# Patient Record
Sex: Female | Born: 1985 | Race: White | Hispanic: No | Marital: Married | Smoking: Former smoker
Health system: Southern US, Community
[De-identification: ages and names within clinical notes are randomized; demographics above are authoritative.]

## PROBLEM LIST (undated history)

## (undated) ENCOUNTER — Inpatient Hospital Stay: Payer: Self-pay

## (undated) DIAGNOSIS — Z6837 Body mass index (BMI) 37.0-37.9, adult: Secondary | ICD-10-CM

## (undated) DIAGNOSIS — F419 Anxiety disorder, unspecified: Secondary | ICD-10-CM

## (undated) DIAGNOSIS — F909 Attention-deficit hyperactivity disorder, unspecified type: Secondary | ICD-10-CM

## (undated) DIAGNOSIS — F32A Depression, unspecified: Secondary | ICD-10-CM

## (undated) DIAGNOSIS — R519 Headache, unspecified: Secondary | ICD-10-CM

## (undated) DIAGNOSIS — I10 Essential (primary) hypertension: Secondary | ICD-10-CM

## (undated) DIAGNOSIS — E669 Obesity, unspecified: Secondary | ICD-10-CM

## (undated) HISTORY — DX: Anxiety disorder, unspecified: F41.9

## (undated) HISTORY — PX: ABDOMINAL HYSTERECTOMY: SHX81

## (undated) HISTORY — DX: Depression, unspecified: F32.A

## (undated) HISTORY — PX: NO PAST SURGERIES: SHX2092

---
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
## 2008-07-24 ENCOUNTER — Emergency Department: Payer: Self-pay | Admitting: Emergency Medicine

## 2008-10-28 ENCOUNTER — Encounter: Payer: Self-pay | Admitting: Obstetrics and Gynecology

## 2008-11-01 ENCOUNTER — Encounter: Payer: Self-pay | Admitting: Obstetrics and Gynecology

## 2008-11-11 ENCOUNTER — Encounter: Payer: Self-pay | Admitting: Maternal & Fetal Medicine

## 2008-11-21 ENCOUNTER — Observation Stay: Payer: Self-pay

## 2008-11-30 ENCOUNTER — Encounter: Payer: Self-pay | Admitting: Pediatric Cardiology

## 2009-01-06 ENCOUNTER — Encounter: Payer: Self-pay | Admitting: Obstetrics and Gynecology

## 2009-03-11 ENCOUNTER — Observation Stay: Payer: Self-pay

## 2009-03-17 ENCOUNTER — Observation Stay: Payer: Self-pay

## 2009-04-03 ENCOUNTER — Inpatient Hospital Stay: Payer: Self-pay | Admitting: Unknown Physician Specialty

## 2009-10-09 IMAGING — US ULTRAOUND OB LIMITED - NRPT [facility]
1 series · 14 of 28 positions shown · non-contrast
Comparison: none

[Series 1: ultraound ob limited - nrpt [facility] · 14 of 41 slices shown]
[im 2/41]
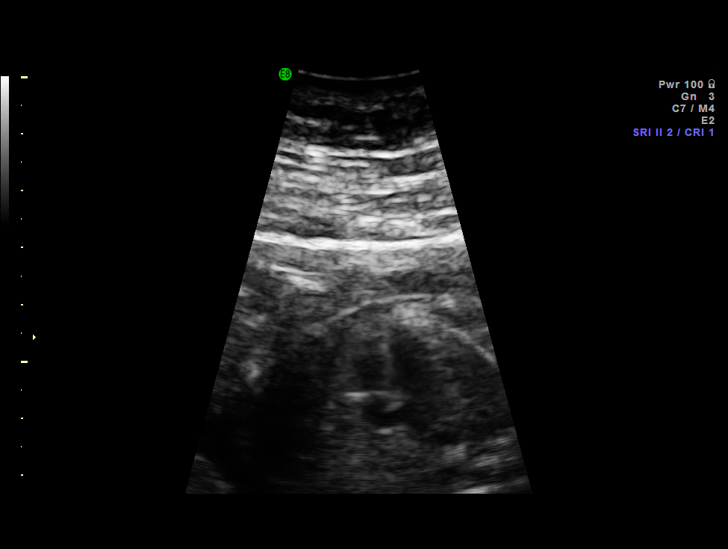
[im 5/41]
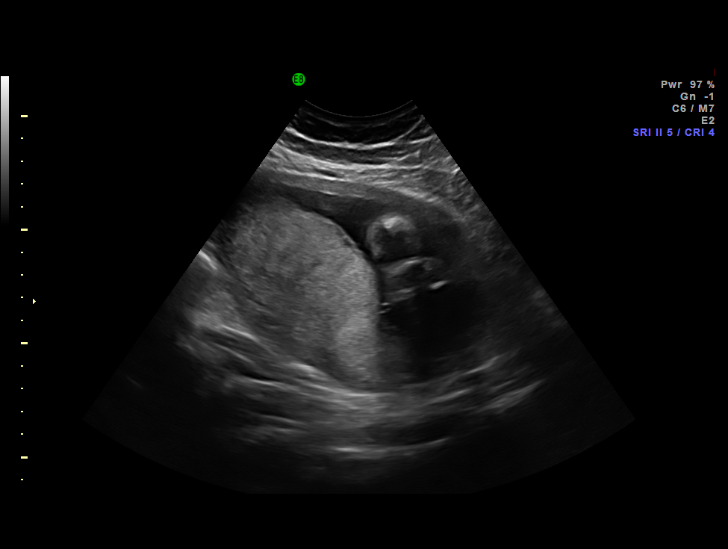
[im 8/41]
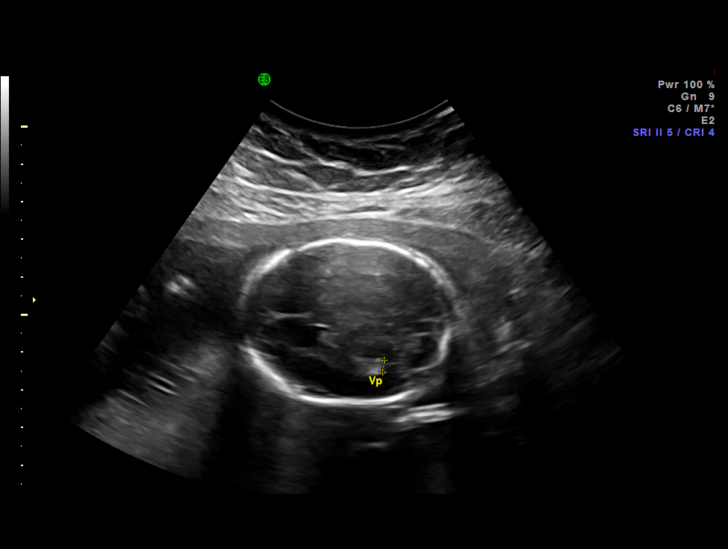
[im 11/41]
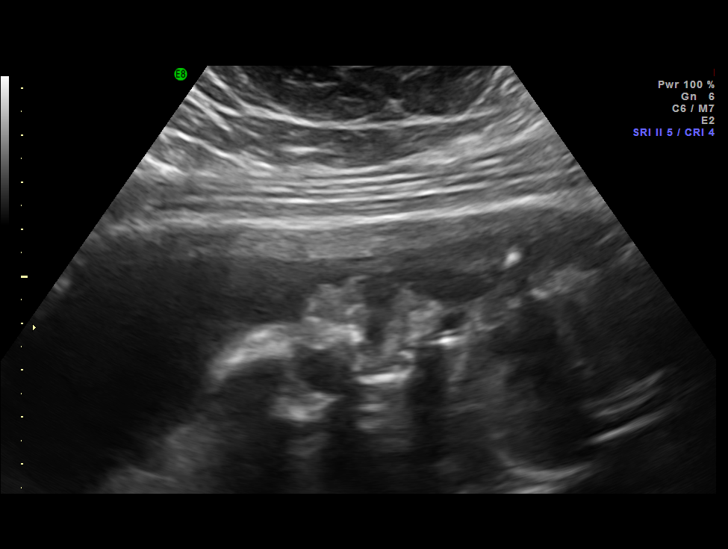
[im 14/41]
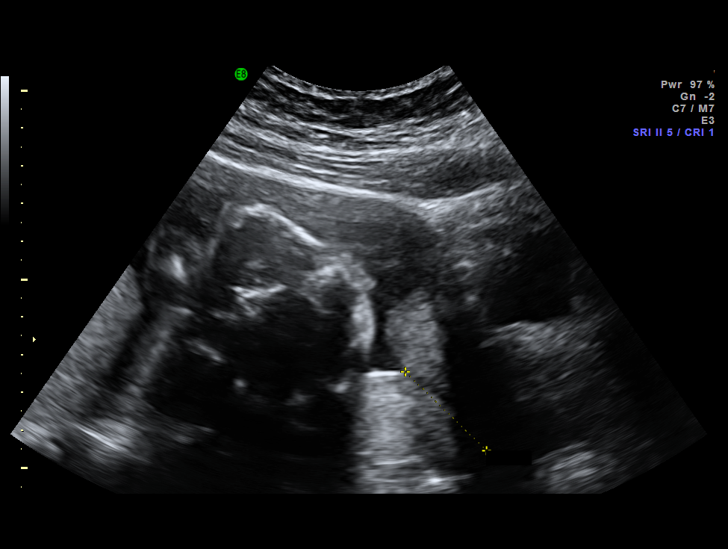
[im 17/41]
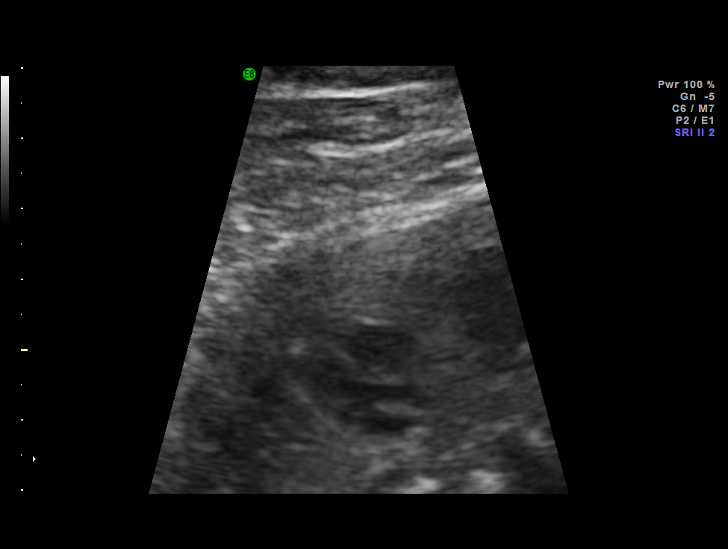
[im 20/41]
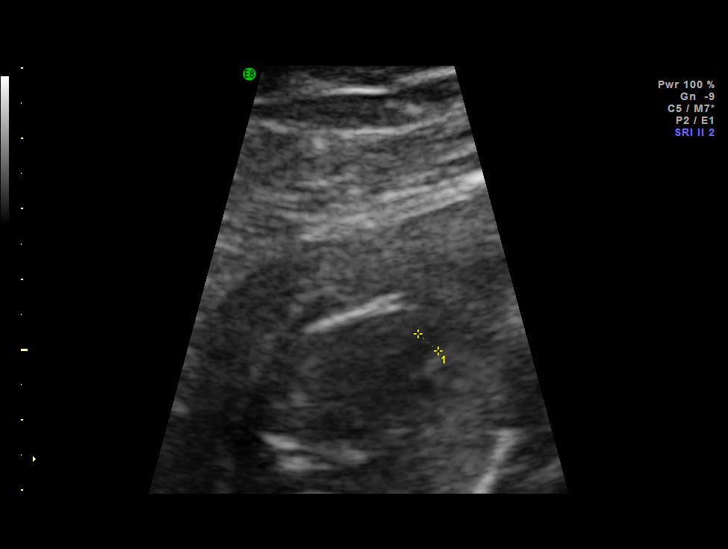
[im 23/41]
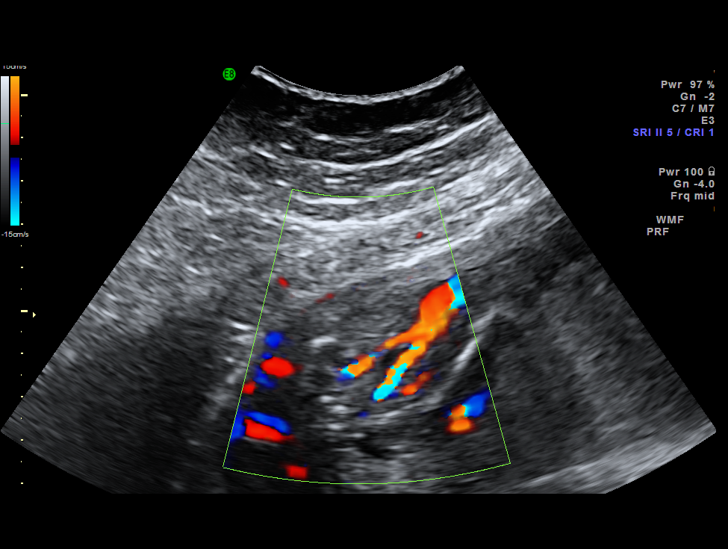
[im 26/41]
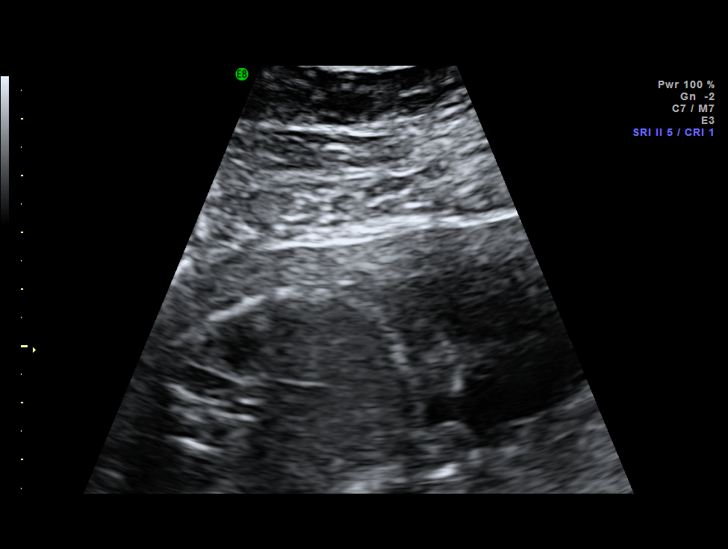
[im 29/41]
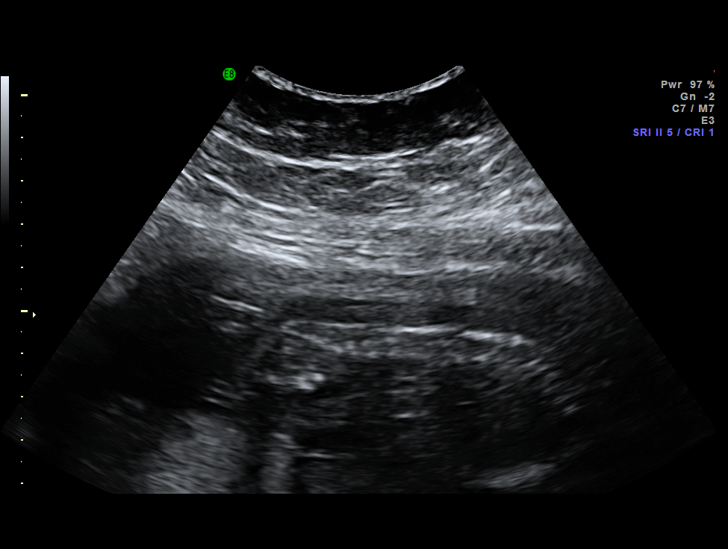
[im 32/41]
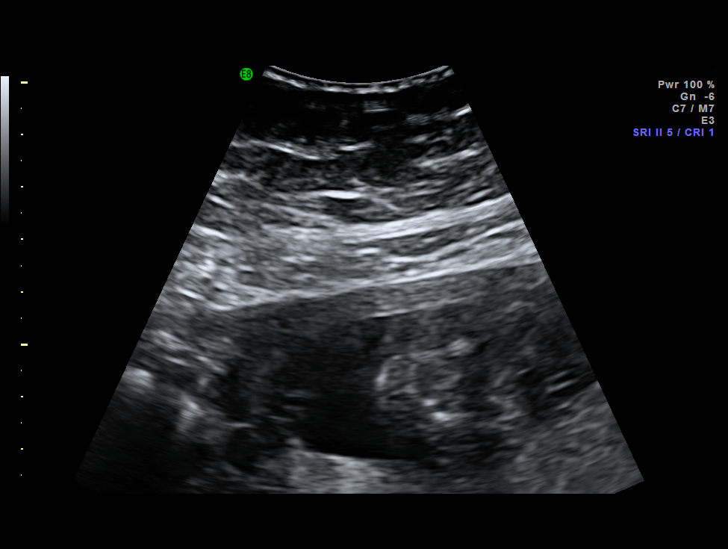
[im 35/41]
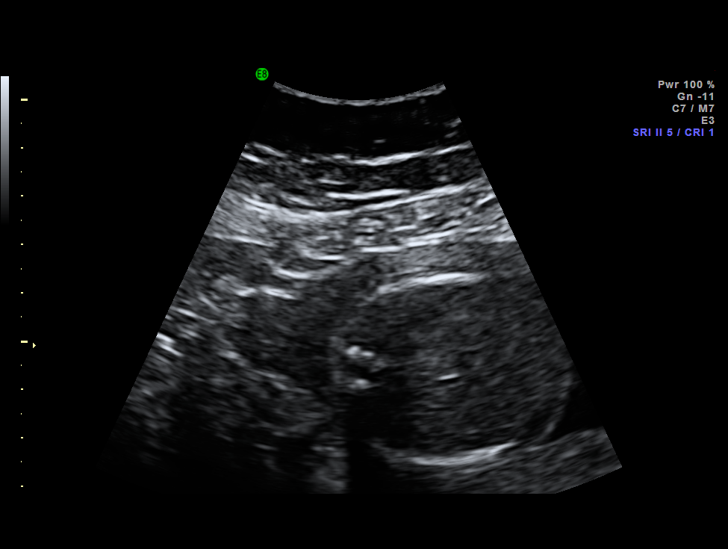
[im 38/41]
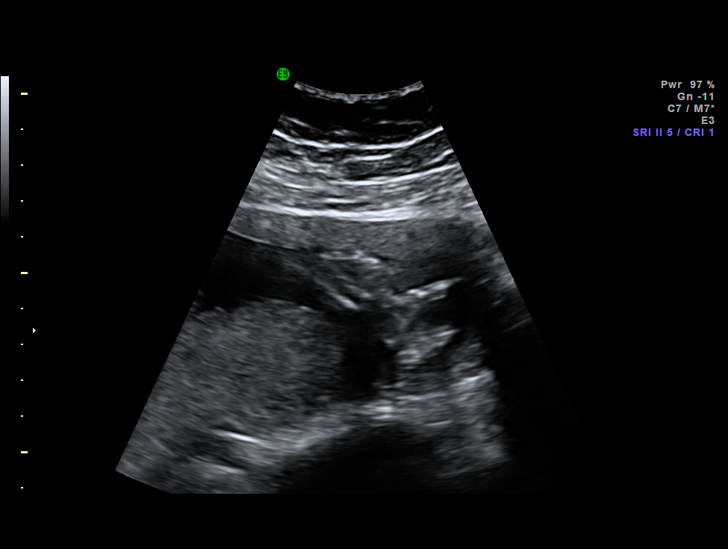
[im 41/41]
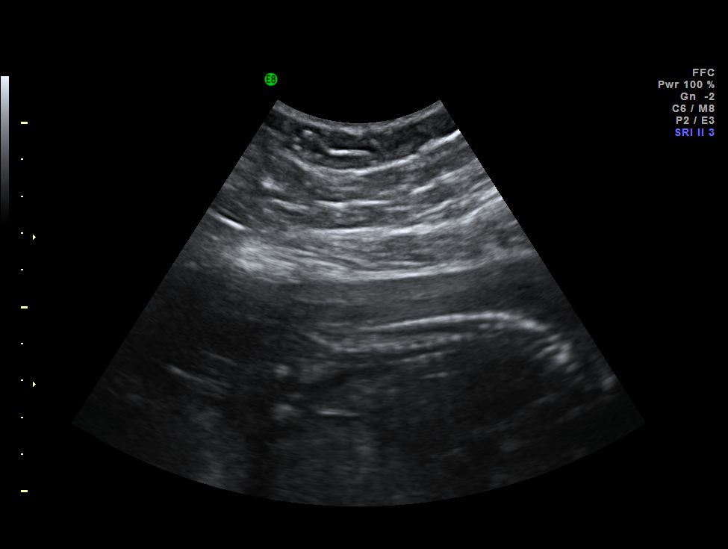

[14 of 28 positions shown; findings below may reference images not displayed]

IMAGES IMPORTED FROM THE [employer] WORKFLOW SYSTEM
NO DICTATION FOR STUDY

## 2010-02-20 ENCOUNTER — Emergency Department: Payer: Self-pay | Admitting: Emergency Medicine

## 2010-04-15 ENCOUNTER — Emergency Department: Payer: Self-pay | Admitting: Unknown Physician Specialty

## 2010-09-25 ENCOUNTER — Emergency Department: Payer: Self-pay | Admitting: Emergency Medicine

## 2010-10-05 ENCOUNTER — Emergency Department: Payer: Self-pay | Admitting: Internal Medicine

## 2011-01-24 ENCOUNTER — Emergency Department: Payer: Self-pay | Admitting: Emergency Medicine

## 2012-08-05 ENCOUNTER — Emergency Department: Payer: Self-pay | Admitting: Emergency Medicine

## 2012-09-13 ENCOUNTER — Emergency Department: Payer: Self-pay | Admitting: Emergency Medicine

## 2013-01-14 ENCOUNTER — Emergency Department: Payer: Self-pay | Admitting: Emergency Medicine

## 2013-08-03 ENCOUNTER — Observation Stay: Payer: Self-pay

## 2013-09-13 ENCOUNTER — Inpatient Hospital Stay: Payer: Self-pay

## 2013-09-13 LAB — CBC WITH DIFFERENTIAL/PLATELET
Basophil #: 0.1 10*3/uL (ref 0.0–0.1)
Basophil %: 0.5 %
Eosinophil #: 0.1 10*3/uL (ref 0.0–0.7)
Eosinophil %: 0.7 %
HGB: 12.7 g/dL (ref 12.0–16.0)
Lymphocyte %: 11.4 %
MCH: 27 pg (ref 26.0–34.0)
MCV: 81 fL (ref 80–100)
Monocyte %: 6.5 %
Neutrophil #: 11.7 10*3/uL — ABNORMAL HIGH (ref 1.4–6.5)
Neutrophil %: 80.9 %
RBC: 4.72 10*6/uL (ref 3.80–5.20)
WBC: 14.4 10*3/uL — ABNORMAL HIGH (ref 3.6–11.0)

## 2013-09-14 LAB — HEMATOCRIT: HCT: 33.6 % — ABNORMAL LOW (ref 35.0–47.0)

## 2014-10-01 NOTE — L&D Delivery Note (Signed)
Obstetrical Delivery Note   Date of Delivery:   09/26/2015 Primary OB:   [facility] Gestational Age/EDD: 2941w2d  Antepartum complications: BMI 37, anxiety/depression, h/o gHTN.  Delivered By:   Abanda Bingharlie Cadince Hilscher, Montez HagemanJr MD  Delivery Type:   spontaneous vaginal delivery  Delivery Details:   Patient had preciptious delivery and went from 4cm to SROM (clear) and complete in approximately 2 hrs. CTSP after SROM and cephalic and +4 and with next UC, patient had vigorous, direct OA infant with nuchal cord (loose x 1) and delivered through it. She recently received one dose of fentanyl 50mcg; peds present at delivery. Delayed cord clamping and cut by father.  Anesthesia:    IV fentanyl x 1 Intrapartum complications: None GBS:    Negative Laceration:    none Episiotomy:    none Placenta:    Delivered and expressed via active management. Intact: yes. To pathology: no.  Estimated Blood Loss:  200mL  Baby:    Liveborn female, APGARs 9/9, weight 3160gm  Cornelia Copaharlie Chennel Olivos, Jr. MD [facility] Pager [phone]

## 2015-02-08 NOTE — H&P (Signed)
L&D Evaluation:  History:  HPI 29 year old G2 P1001 now 4634 5/7 weeks by a  EDC=09/09/2013 based  on a 7wk2 day ultrasound presents for evaluation of fetal well being after fall she sustained last night. She stepped into a hole in her lawn with her right foot, falling on her right hip and left hand while twisting her right ankle. She was seen and evaluated in ER for right ankle injury-thought to be a strain and she is currently wearing a boot. splint. Denies VB or LOF or abdominal pain. Has felt some tightening and pressure (but that started weeks prior to the fall). Decreased FM this AM, but she did feel FM while in the ER. Hungry and thirsty. Has not taken anything for pain except a ES Tylenol last night. [facility]. A POS blood type   Presents with decreased fetal movement, S/P fall   Patient's Medical History No Chronic Illness   Patient's Surgical History none   Medications Pre Natal Vitamins  Tylenol (Acetaminophen)   Allergies NKDA   Social History none   Family History Non-Contributory   ROS:  ROS see HPI   Exam:  Vital Signs 121/51    Urine Protein not completed   General no apparent distress   Mental Status clear    Abdomen gravid, non-tender   Fetal Position cephalic on Leopolds   Edema right ankle edematous-boot on    FHT normal rate with no decels, 130 with accel to 150   FHT Description moderate variability   Fetal Heart Rate 132    Ucx 2 ctxs noted in last 10 min   Skin dry, no briusing on right hip   Impression:  Impression IUP at 34 5/7 weeks. S/P fall-for evaluation of fetal well being. Right ankle sprain   Plan:  Plan EFM/NST, monitor contractions and for cervical change, NST/regular diet/po hydration. Tylenol for pain. Elevate right leg   Comments 1630. Addendum: Reactive nonstress test. Contractions spaced out with po hydration. Three ctxs in last hour of monitoring. Will discharge home with abruption precautions.  Can take ES Tylenol for pain/ elevate leg/ice. [facility] or sooner prn. Has instructions for followup with right ankle if no improvement. OOW until Friday.   Electronic Signatures: Trinna BalloonGutierrez, Chenee Munns L (CNM)  (Signed [phone]-Nov-14 16:37)  Authored: L&D Evaluation   Last Updated: 03-Nov-14 16:37 by Trinna BalloonGutierrez, Hal Norrington L (CNM)

## 2015-02-08 NOTE — H&P (Signed)
L&D Evaluation:  History:  HPI 29 year old G2 P1001 now 8940 4/7 weeks by 7wk US derived EDC=09/09/2013 presenting wtih contractions that started yesterday evening.  1cm on last check in clinic.  +FM, no LOF, no VB.  [facility]. A POS blood type   Presents with decreased fetal movement, S/P fall   Patient's Medical History No Chronic Illness   Patient's Surgical History none   Medications Pre Natal Vitamins  Tylenol (Acetaminophen)   Allergies NKDA   Social History none   Family History Non-Contributory   ROS:  ROS see HPI   Exam:  Vital Signs stable   Urine Protein not completed   General no apparent distress   Mental Status clear   Abdomen gravid, non-tender   Fetal Position vtx   Edema no edema   Pelvic no external lesions, 3cm on admission 4-5 after 1-hr   Mebranes Intact   FHT normal rate with no decels   Ucx regular   Impression:  Impression IUP at 40weeks 4/7 days presenting in term labor   Plan:  Plan EFM/NST, monitor contractions and for cervical change   Comments 1) Labor - expectant managment.  May have epidural once platelets return.  Stadol 1-2mg  q3hrs prn  2) Fetus - category I tracing  3) PNL: A pos / RI / VZI / HIV neg / HBsAg neg / RPR NR / GBS negative  4) Disposition - anticipate vaginal delivery   Electronic Signatures: Lorrene ReidStaebler, Issa Luster M (MD)  (Signed 14-Dec-14 02:07)  Authored: L&D Evaluation   Last Updated: 14-Dec-14 02:07 by Lorrene ReidStaebler, Hero Mccathern M (MD)

## 2015-07-07 ENCOUNTER — Observation Stay
Admission: EM | Admit: 2015-07-07 | Discharge: 2015-07-07 | Disposition: A | Discharge status: Home / Self Care | Payer: Managed Care, Other (non HMO) | Attending: Obstetrics and Gynecology | Admitting: Obstetrics and Gynecology

## 2015-07-07 ENCOUNTER — Encounter: Payer: Self-pay | Admitting: *Deleted

## 2015-07-07 DIAGNOSIS — O99613 Diseases of the digestive system complicating pregnancy, third trimester: Principal | ICD-10-CM | POA: Insufficient documentation

## 2015-07-07 DIAGNOSIS — Z6837 Body mass index (BMI) 37.0-37.9, adult: Secondary | ICD-10-CM | POA: Insufficient documentation

## 2015-07-07 DIAGNOSIS — Z87891 Personal history of nicotine dependence: Secondary | ICD-10-CM | POA: Diagnosis not present

## 2015-07-07 DIAGNOSIS — K5289 Other specified noninfective gastroenteritis and colitis: Secondary | ICD-10-CM | POA: Diagnosis not present

## 2015-07-07 DIAGNOSIS — O99213 Obesity complicating pregnancy, third trimester: Secondary | ICD-10-CM | POA: Diagnosis not present

## 2015-07-07 DIAGNOSIS — Z3A28 28 weeks gestation of pregnancy: Secondary | ICD-10-CM | POA: Insufficient documentation

## 2015-07-07 DIAGNOSIS — E669 Obesity, unspecified: Secondary | ICD-10-CM | POA: Diagnosis present

## 2015-07-07 DIAGNOSIS — O219 Vomiting of pregnancy, unspecified: Secondary | ICD-10-CM | POA: Diagnosis present

## 2015-07-07 HISTORY — DX: Obesity, unspecified: E66.9

## 2015-07-07 HISTORY — DX: Body mass index (BMI) 37.0-37.9, adult: Z68.37

## 2015-07-07 LAB — URINALYSIS COMPLETE WITH MICROSCOPIC (ARMC ONLY)
BACTERIA UA: NONE SEEN
Bilirubin Urine: NEGATIVE
GLUCOSE, UA: NEGATIVE mg/dL
Hgb urine dipstick: NEGATIVE
Leukocytes, UA: NEGATIVE
NITRITE: NEGATIVE
Protein, ur: 30 mg/dL — AB
SPECIFIC GRAVITY, URINE: 1.021 (ref 1.005–1.030)
pH: 5 (ref 5.0–8.0)

## 2015-07-07 MED ORDER — ONDANSETRON 4 MG PO TBDP
8.0000 mg | ORAL_TABLET | Freq: Once | ORAL | Status: AC
  Administered 2015-07-07 (×2): 8 mg via ORAL
  Filled 2015-07-07: qty 2

## 2015-07-07 MED ORDER — ONDANSETRON 4 MG PO TBDP
ORAL_TABLET | ORAL | Status: AC
  Administered 2015-07-07: 8 mg via ORAL
  Filled 2015-07-07: qty 2

## 2015-07-07 MED ORDER — ONDANSETRON 8 MG PO TBDP: 8.0000 mg | ORAL_TABLET | Freq: Three times a day (TID) | ORAL | Status: DC | PRN

## 2015-07-07 MED ORDER — ONDANSETRON 4 MG PO TBDP: 8.0000 mg | ORAL_TABLET | Freq: Once | ORAL | Status: DC

## 2015-07-07 NOTE — Discharge Summary (Signed)
Patient resting well, awakened without complaints of nausea.  No vomiting or diarrhea present during hospital stay.  Patient requesting water and food.  Discharge order received from md.  Given  of zofran to take with her per md order.  Prescription also provided.  Reviewed schedule of doses with patient.  Verbalizes understanding.  Taken to visitor entrance via wheelchair for discharge.  Husband waiting with car.

## 2015-07-07 NOTE — OB Triage Provider Note (Signed)
OB History & Physical   History of Present Illness:  Chief Complaint: nausea and emesis  HPI:  Jessica Espinoza is a 29 y.o. G30P2002 female at [redacted]w[redacted]d GA .  Her pregnancy has been complicated by obesity with BMI 37.    She denies contractions.   She denies leakage of fluid.   She denies vginal bleeding.   She reports fetal movement.   She presents after new-onset of nausea and vomiting this morning at about 7am. She has been able to keep food down for about 30 minutes and then vomits.  She has no sick contacts.  Denies fevers, chills, abdominal pain (apart from after she vomits).    Maternal Medical History:   Past Medical History  Diagnosis Date  . Obesity   . BMI 37.0-37.9, adult     Past Surgical History  Procedure Laterality Date  . No past surgeries     Allergies:  No Known Allergies  Prior to Admission medications   Medication Sig Start Date End Date Taking? Authorizing Provider  Prenatal MV-Min-Fe Cbn-FA-DHA (PRENATAL PLUS DHA) 7-0.4-100 MG TABS Take by mouth 1 day or 1 dose.   Yes Historical Provider, MD  Prenatal Vit-Fe Fumarate-FA (PRENATAL MULTIVITAMIN) TABS tablet Take 1 tablet by mouth daily at 12 noon.   Yes Historical Provider, MD    OB History  Gravida Para Term Preterm AB SAB TAB Ectopic Multiple Living  # Outcome Date GA Lbr Len/2nd Weight Sex Delivery Anes PTL Lv  3 Current           2 Term 09/13/13   7 lb 9 oz (3.43 kg) M Vag-Spont  N Y  1 Term 04/03/09   7 lb (3.175 kg) F Vag-Spont  N Y      Prenatal care site: [facility]  Social History: She  reports that she quit smoking about 7 years ago. Her smoking use included Cigarettes. She does not have any smokeless tobacco history on file. She reports that she does not drink alcohol or use illicit drugs.  Family History: family history is not on file.   Review of Systems: Negative x 10 systems reviewed except as noted in the HPI.    Physical Exam:  Vital Signs: BP 132/69 mmHg   Pulse 107  Temp(Src) 98.8 F (37.1 C) (Oral)  Resp 16  Ht  (1.626 m)  Wt 225 lb (102.059 kg)  BMI 38.60 kg/m2  LMP 12/18/2014 General: no acute distress.  HEENT: normocephalic, atraumatic Heart: regular rate & rhythm.  No murmurs/rubs/gallops Lungs: clear to auscultation bilaterally Abdomen: soft, gravid, non-tender;   Pelvic: Deferred Extremities: non-tender, symmetric, no edema bilaterally.  DTRs: 2+  Neurologic: Alert & oriented x 3.    Pertinent Results:  Prenatal Labs: Blood type/Rh A positive  Antibody screen negative  Rubella Immune  RPR NR  HBsAg negative  HIV negative  GC negative  Chlamydia negative  Genetic screening declined  1 hour GTT Early 94 (no 28 week gtt yet)  3 hour GTT N/a  GBS unknown   Baseline FHR: 145 beats/min   Variability: moderate   Accelerations: absent   Decelerations: absent Contractions: absent Overall assessment: reassuring for gestational age   Assessment:  Jessica Espinoza is a 29 y.o. G49P2002 female at [redacted]w[redacted]d gestational age with gastroenteritis.  She has had a PO challenge after zofran and has had no emesis nor nausea  Plan:  1. May  discharge home with zofran 2. follow up appointment tomorrow  Conard Novak, MD 07/07/2015 8:08 PM

## 2015-09-26 ENCOUNTER — Encounter: Payer: Self-pay | Admitting: *Deleted

## 2015-09-26 ENCOUNTER — Inpatient Hospital Stay
Admission: EM | Admit: 2015-09-26 | Discharge: 2015-09-28 | DRG: 775 | Disposition: A | Discharge status: Home / Self Care | Payer: Managed Care, Other (non HMO) | Attending: Obstetrics and Gynecology | Admitting: Obstetrics and Gynecology

## 2015-09-26 DIAGNOSIS — Z6837 Body mass index (BMI) 37.0-37.9, adult: Secondary | ICD-10-CM | POA: Diagnosis not present

## 2015-09-26 DIAGNOSIS — Z3493 Encounter for supervision of normal pregnancy, unspecified, third trimester: Secondary | ICD-10-CM

## 2015-09-26 DIAGNOSIS — E669 Obesity, unspecified: Secondary | ICD-10-CM | POA: Diagnosis present

## 2015-09-26 DIAGNOSIS — O134 Gestational [pregnancy-induced] hypertension without significant proteinuria, complicating childbirth: Principal | ICD-10-CM | POA: Diagnosis present

## 2015-09-26 DIAGNOSIS — H401190 Primary open-angle glaucoma, unspecified eye, stage unspecified: Secondary | ICD-10-CM | POA: Diagnosis present

## 2015-09-26 DIAGNOSIS — F329 Major depressive disorder, single episode, unspecified: Secondary | ICD-10-CM | POA: Diagnosis present

## 2015-09-26 DIAGNOSIS — F419 Anxiety disorder, unspecified: Secondary | ICD-10-CM | POA: Diagnosis present

## 2015-09-26 LAB — COMPREHENSIVE METABOLIC PANEL
ALBUMIN: 3.1 g/dL — AB (ref 3.5–5.0)
ALT: 14 U/L (ref 14–54)
AST: 22 U/L (ref 15–41)
Alkaline Phosphatase: 111 U/L (ref 38–126)
Anion gap: 12 (ref 5–15)
BUN: 6 mg/dL (ref 6–20)
CHLORIDE: 104 mmol/L (ref 101–111)
CO2: 21 mmol/L — AB (ref 22–32)
CREATININE: 0.52 mg/dL (ref 0.44–1.00)
Calcium: 9.4 mg/dL (ref 8.9–10.3)
GFR calc Af Amer: >60 mL/min (ref >60)
GLUCOSE: 118 mg/dL — AB (ref 65–99)
POTASSIUM: 4 mmol/L (ref 3.5–5.1)
Sodium: 137 mmol/L (ref 135–145)
Total Bilirubin: 0.5 mg/dL (ref 0.3–1.2)
Total Protein: 6.7 g/dL (ref 6.5–8.1)

## 2015-09-26 LAB — CBC
HEMATOCRIT: 41.2 % (ref 35.0–47.0)
HEMOGLOBIN: 13.9 g/dL (ref 12.0–16.0)
MCH: 27.4 pg (ref 26.0–34.0)
MCHC: 33.8 g/dL (ref 32.0–36.0)
MCV: 81 fL (ref 80.0–100.0)
Platelets: 140 10*3/uL — ABNORMAL LOW (ref 150–440)
RBC: 5.08 MIL/uL (ref 3.80–5.20)
RDW: 14.1 % (ref 11.5–14.5)
WBC: 18.1 10*3/uL — AB (ref 3.6–11.0)

## 2015-09-26 LAB — TYPE AND SCREEN
ABO/RH(D): A POS
ANTIBODY SCREEN: NEGATIVE

## 2015-09-26 LAB — ABO/RH: ABO/RH(D): A POS

## 2015-09-26 LAB — PROTEIN / CREATININE RATIO, URINE
Creatinine, Urine: 160 mg/dL
Protein Creatinine Ratio: 0.27 mg/mg{Cre} — ABNORMAL HIGH (ref 0.00–0.15)
Total Protein, Urine: 43 mg/dL

## 2015-09-26 MED ORDER — LACTATED RINGERS IV SOLN
INTRAVENOUS | Status: DC
  Administered 2015-09-26: 1000 mL via INTRAVENOUS

## 2015-09-26 MED ORDER — OXYTOCIN 40 UNITS IN LACTATED RINGERS INFUSION - SIMPLE MED
62.5000 mL/h | INTRAVENOUS | Status: DC
  Administered 2015-09-26: 62.5 mL/h via INTRAVENOUS
  Filled 2015-09-26 (×2): qty 1000

## 2015-09-26 MED ORDER — LACTATED RINGERS IV SOLN: 500.0000 mL | INTRAVENOUS | Status: DC | PRN

## 2015-09-26 MED ORDER — SIMETHICONE 80 MG PO CHEW: 80.0000 mg | CHEWABLE_TABLET | ORAL | Status: DC | PRN

## 2015-09-26 MED ORDER — BENZOCAINE-MENTHOL 20-0.5 % EX AERO: 1.0000 "application " | INHALATION_SPRAY | CUTANEOUS | Status: DC | PRN

## 2015-09-26 MED ORDER — LIDOCAINE HCL (PF) 1 % IJ SOLN
30.0000 mL | INTRAMUSCULAR | Status: DC | PRN
  Filled 2015-09-26: qty 30

## 2015-09-26 MED ORDER — ONDANSETRON HCL 4 MG/2ML IJ SOLN: 4.0000 mg | Freq: Four times a day (QID) | INTRAMUSCULAR | Status: DC | PRN

## 2015-09-26 MED ORDER — OXYTOCIN 40 UNITS IN LACTATED RINGERS INFUSION - SIMPLE MED
62.5000 mL/h | INTRAVENOUS | Status: DC | PRN
  Filled 2015-09-26: qty 1000

## 2015-09-26 MED ORDER — OXYTOCIN BOLUS FROM INFUSION
500.0000 mL | INTRAVENOUS | Status: DC
  Administered 2015-09-26: 500 mL via INTRAVENOUS
  Filled 2015-09-26: qty 500

## 2015-09-26 MED ORDER — SERTRALINE HCL 25 MG PO TABS: 50.0000 mg | ORAL_TABLET | Freq: Every day | ORAL | Status: DC

## 2015-09-26 MED ORDER — DIPHENHYDRAMINE HCL 25 MG PO CAPS: 25.0000 mg | ORAL_CAPSULE | Freq: Four times a day (QID) | ORAL | Status: DC | PRN

## 2015-09-26 MED ORDER — DOCUSATE SODIUM 100 MG PO CAPS
100.0000 mg | ORAL_CAPSULE | Freq: Two times a day (BID) | ORAL | Status: DC | PRN
  Administered 2015-09-26: 100 mg via ORAL
  Filled 2015-09-26: qty 1

## 2015-09-26 MED ORDER — IBUPROFEN 600 MG PO TABS
600.0000 mg | ORAL_TABLET | Freq: Four times a day (QID) | ORAL | Status: DC
  Administered 2015-09-26 (×3): 600 mg via ORAL
  Filled 2015-09-26 (×3): qty 1

## 2015-09-26 MED ORDER — OXYCODONE-ACETAMINOPHEN 5-325 MG PO TABS
1.0000 | ORAL_TABLET | Freq: Four times a day (QID) | ORAL | Status: DC | PRN
  Administered 2015-09-26 – 2015-09-27 (×4): 1 via ORAL
  Filled 2015-09-26 (×4): qty 1

## 2015-09-26 MED ORDER — ONDANSETRON HCL 4 MG PO TABS: 4.0000 mg | ORAL_TABLET | ORAL | Status: DC | PRN

## 2015-09-26 MED ORDER — LANOLIN HYDROUS EX OINT: TOPICAL_OINTMENT | CUTANEOUS | Status: DC | PRN

## 2015-09-26 MED ORDER — PRENATAL MULTIVITAMIN CH
1.0000 | ORAL_TABLET | Freq: Every day | ORAL | Status: DC
  Administered 2015-09-26 – 2015-09-28 (×3): 1 via ORAL
  Filled 2015-09-26 (×3): qty 1

## 2015-09-26 MED ORDER — WITCH HAZEL-GLYCERIN EX PADS: 1.0000 "application " | MEDICATED_PAD | CUTANEOUS | Status: DC | PRN

## 2015-09-26 MED ORDER — MAGNESIUM HYDROXIDE 400 MG/5ML PO SUSP: 30.0000 mL | ORAL | Status: DC | PRN

## 2015-09-26 MED ORDER — ACETAMINOPHEN 325 MG PO TABS
650.0000 mg | ORAL_TABLET | ORAL | Status: DC | PRN
  Administered 2015-09-27: 650 mg via ORAL
  Filled 2015-09-26: qty 2

## 2015-09-26 MED ORDER — DIBUCAINE 1 % RE OINT: 1.0000 "application " | TOPICAL_OINTMENT | RECTAL | Status: DC | PRN

## 2015-09-26 MED ORDER — ONDANSETRON HCL 4 MG/2ML IJ SOLN: 4.0000 mg | INTRAMUSCULAR | Status: DC | PRN

## 2015-09-26 MED ORDER — FENTANYL CITRATE (PF) 100 MCG/2ML IJ SOLN
50.0000 ug | INTRAMUSCULAR | Status: DC | PRN
  Administered 2015-09-26: 50 ug via INTRAVENOUS
  Filled 2015-09-26: qty 2

## 2015-09-26 MED ORDER — CITRIC ACID-SODIUM CITRATE 334-500 MG/5ML PO SOLN: 30.0000 mL | ORAL | Status: DC | PRN

## 2015-09-26 MED ORDER — SERTRALINE HCL 25 MG PO TABS
50.0000 mg | ORAL_TABLET | Freq: Every day | ORAL | Status: DC
  Administered 2015-09-26 – 2015-09-27 (×2): 50 mg via ORAL
  Filled 2015-09-26 (×2): qty 2

## 2015-09-26 MED ORDER — ACETAMINOPHEN 325 MG PO TABS: 650.0000 mg | ORAL_TABLET | ORAL | Status: DC | PRN

## 2015-09-26 MED ORDER — IBUPROFEN 600 MG PO TABS
600.0000 mg | ORAL_TABLET | Freq: Four times a day (QID) | ORAL | Status: DC
  Administered 2015-09-27 – 2015-09-28 (×6): 600 mg via ORAL
  Filled 2015-09-26 (×6): qty 1

## 2015-09-26 NOTE — H&P (Addendum)
Obstetrics Admission History & Physical  09/26/2015 - 2:37 AM Primary OBGYN: [facility]  Chief Complaint: regular UCs  History of Present Illness  29 y.o. [id number] @ [redacted]w[redacted]d (Dating: EDC 12/24, LMP=8), with the above CC. Pregnancy complicated by: BMI 37, anxiety/depression, h/o gHTN.  Ms. Jessica Espinoza states that she's been having regular UCs but no VB, decreased FM or LOF and she denies any s/s of pre-eclampsia.   Review of Systems: her 12 point review of systems is negative or as noted in the History of Present Illness.  PMHx:  Past Medical History  Diagnosis Date  . Obesity   . BMI 37.0-37.9, adult    PSHx:  Past Surgical History  Procedure Laterality Date  . No past surgeries     Medications:  Prescriptions prior to admission  Medication Sig Dispense Refill Last Dose  . Prenatal MV-Min-Fe Cbn-FA-DHA (PRENATAL PLUS DHA) 7-0.4-100 MG TABS Take by mouth 1 day or 1 dose.   09/25/2015 at Unknown time  . Prenatal Vit-Fe Fumarate-FA (PRENATAL MULTIVITAMIN) TABS tablet Take 1 tablet by mouth daily at 12 noon.   09/25/2015 at Unknown time  . sertraline (ZOLOFT) 50 MG tablet Take 50 mg by mouth daily.   09/25/2015 at Unknown time  . ondansetron (ZOFRAN ODT) 8 MG disintegrating tablet Take 1 tablet (8 mg total) by mouth every 8 (eight) hours as needed for nausea or vomiting. (Patient not taking: Reported on 09/26/2015) 20 tablet 0 Not Taking     Allergies: has No Known Allergies. OBHx:  OB History  Gravida Para Term Preterm AB SAB TAB Ectopic Multiple Living  # Outcome Date GA Lbr Len/2nd Weight Sex Delivery Anes PTL Lv  3 Current           2 Term 09/13/13   3.43 kg (7 lb 9 oz) M Vag-Spont  N Y  1 Term 04/03/09   3.175 kg (7 lb) F Vag-Spont  N Y      GYNHx:  History of abnormal pap smears: No. History of STIs: No..             FHx: History reviewed. No pertinent family history. Soc Hx:  Social History   Social History  . Marital Status: Married   Spouse Name: N/A  . Number of Children: N/A  . Years of Education: N/A   Occupational History  . Not on file.   Social History Main Topics  . Smoking status: Former Smoker    Types: Cigarettes    Quit date: 05/07/2008  . Smokeless tobacco: Not on file  . Alcohol Use: No  . Drug Use: No  . Sexual Activity: Yes   Other Topics Concern  . Not on file   Social History Narrative    Objective   Filed Vitals:   09/26/15 0130 09/26/15 0148  BP: 155/98 129/82  Pulse: 88 89  Temp: 98.2 F (36.8 C)   Resp:  20     Current Vital Signs 24h Vital Sign Ranges  T 98.2 F (36.8 C) Temp  Avg: 98.2 F (36.8 C)  Min: 98.2 F (36.8 C)  Max: 98.2 F (36.8 C)  BP 129/82 mmHg BP  Min: 129/82  Max: 155/98  HR 89 Pulse  Avg: 88.5  Min: 88  Max: 89  RR 20 Resp  Avg: 20  Min: 20  Max: 20  SaO2    98/RA No Data Recorded  24 Hour I/O Current Shift I/O  Time Ins Outs       EFM: 125 baseline, +accels, rare variables, mod var  Toco: q2-260m  General: Well nourished, well developed female in moderate distress with UCs Skin:  Warm and dry.  Cardiovascular: Regular rate and rhythm. Respiratory:  Clear to auscultation bilateral. Normal respiratory effort Abdomen: obese, gravid, nttp Neuro/Psych:  Normal mood and affect.   SSE: deferred SVE: changed from 4/90/-1to 5cm per RN Leopolds/EFW: cephalic, 3400gm  Labs  pending  Radiology none  Perinatal info  A pos/ Rubella  Immune / Varicella Immune/RPR pending/HIV Negative/HepB Surf Ag Negative/TDaP:yes / Flu shot: yes/pap 2016/  Assessment & Plan   29 y.o. Z6X0960G3P2002 @ 4372w2d likely transitioning into active labor *IUP: category II with accels; overal fetal status reassuring with mod var, normal baseline and accels.  *term labor: augment prn with AROM, 2-3hrs after epidural placement if EFM reassuring, to allow pt to rest *GBS: neg *gHTN: follow up subsequent BPs. Pre-eclampsia labs ordered. No s/s of severe s/s.  *Analgesia:  desires epidural which is fine *Psych: continue home med   Cornelia Copaharlie Millisa Giarrusso, Jr. MD [facility] Pager [phone]

## 2015-09-26 NOTE — Discharge Summary (Signed)
Obstetrical Discharge Summary  Date of Admission: 09/26/2015 Date of Discharge: 09/28/2015  Primary OB: [facility]  Gestational Age at Delivery: 6912w2d   Antepartum complications: BMI 37, anxiety/depression, h/o gHTN. Reason for Admission: active, term labor Date of Delivery: 09/28/2015  Delivered By: Cornelia Copaharlie Pickens, Jr Md Delivery Type: spontaneous vaginal delivery Intrapartum complications/course: None Anesthesia: IV fentanyl Placenta: expressed. Intact: yes. To pathology: no.  Laceration: none Episiotomy: none Baby: Liveborn female, APGARs9/9, weight 3160 g.   Postpartum course: routine postpartum course after vaginal delivery. Blood pressures mildly elevated following delivery. Will go home without HTN medications. Discharge Vital Signs:  Current Vital Signs 24h Vital Sign Ranges  T 97.6 F (36.4 C) Temp  Avg: 98.2 F (36.8 C)  Min: 97.6 F (36.4 C)  Max: 98.7 F (37.1 C)  BP (!) 142/81 mmHg BP  Min: 114/53  Max: 152/90  HR 77 Pulse  Avg: 71.2  Min: 61  Max: 77  RR 18 Resp  Avg: 19.2  Min: 18  Max: 20  SaO2 99 % Not Delivered SpO2  Avg: 99 %  Min: 99 %  Max: 99 %       24 Hour I/O Current Shift I/O  Time Ins Outs 12/27 0701 - 12/28 0700 In: 480 [P.O.:480] Out: -       Patient Vitals for the past 6 hrs:  BP Temp Temp src Pulse Resp SpO2  09/28/15 0754 (!) 142/81 mmHg 97.6 F (36.4 C) Oral 77 18 99 %    Discharge Exam:  NAD Perineum: intact Abdomen: firm fundus below the umbilicus. RRR no MRGs CTAB Ext: no c/c/e  Disposition: Home  Rh Immune globulin given: not applicable Rubella vaccine given: not applicable Tdap vaccine given in AP or PP setting: yes Flu vaccine given in AP or PP setting: yes  Contraception: prefers abstinence until 6 week check. Pt and husband are considering vasectomy and may choose interim progesterone only method. Will discuss further at 10 day follow up visit.  Prenatal/Postnatal Panel: A pos//Rubella Immune//Varicella Immune//RPR  negative//HIV negative/HepB Surface Ag negative//pap no abnormalities (date: 2016)//plans to breastfeed  Plan:  Jessica Espinoza was discharged to home in good condition. Follow-up appointment with Dr Vergie LivingPickens  in 1-2 week for a mood check and BP check  Discharge Medications:   Medication List    STOP taking these medications        ondansetron 8 MG disintegrating tablet  Commonly known as:  ZOFRAN ODT     PRENATAL PLUS DHA 7-0.4-100 MG Tabs      TAKE these medications        prenatal multivitamin Tabs tablet  Take 1 tablet by mouth daily at 12 noon.     sertraline 50 MG tablet  Commonly known as:  ZOLOFT  Take 50 mg by mouth daily.       Tresea MallGLEDHILL,Shaylie Eklund, CNM   This patient and plan were discussed with Dr Tiburcio PeaHarris 09/28/2015

## 2015-09-26 NOTE — Discharge Instructions (Addendum)
Vaginal Delivery, Care After Refer to this sheet in the next few weeks. These discharge instructions provide you with information on caring for yourself after delivery. Your caregiver may also give you specific instructions. Your treatment has been planned according to the most current medical practices available, but problems sometimes occur. Call your caregiver if you have any problems or questions after you go home. HOME CARE INSTRUCTIONS  Take over-the-counter or prescription medicines only as directed by your caregiver or pharmacist.  Do not drink alcohol, especially if you are breastfeeding or taking medicine to relieve pain.  Do not smoke tobacco.  Continue to use good perineal care. Good perineal care includes:  Wiping your perineum from back to front  Keeping your perineum clean.  You can do sitz baths twice a day, to help keep this area clean  Do not use tampons, douche or have sex until your caregiver says it is okay.  Shower only and avoid sitting in submerged water, aside from sitz baths  Wear a well-fitting bra that provides breast support.  Eat healthy foods.  Drink enough fluids to keep your urine clear or pale yellow.  Eat high-fiber foods such as whole grain cereals and breads, brown rice, beans, and fresh fruits and vegetables every day. These foods may help prevent or relieve constipation.  Avoid constipation with high fiber foods or medications, such as miralax or metamucil  Follow your caregiver's recommendations regarding resumption of activities such as climbing stairs, driving, lifting, exercising, or traveling.  Talk to your caregiver about resuming sexual activities. Resumption of sexual activities is dependent upon your risk of infection, your rate of healing, and your comfort and desire to resume sexual activity.  Try to have someone help you with your household activities and your newborn for at least a few days after you leave the  hospital.  Rest as much as possible. Try to rest or take a nap when your newborn is sleeping.  Increase your activities gradually.  Keep all of your scheduled postpartum appointments. It is very important to keep your scheduled follow-up appointments. At these appointments, your caregiver will be checking to make sure that you are healing physically and emotionally. SEEK MEDICAL CARE IF:   You are passing large clots from your vagina. Save any clots to show your caregiver.  You have a foul smelling discharge from your vagina.  You have trouble urinating.  You are urinating frequently.  You have pain when you urinate.  You have a change in your bowel movements.  You have increasing redness, pain, or swelling near your vaginal incision (episiotomy) or vaginal tear.  You have pus draining from your episiotomy or vaginal tear.  Your episiotomy or vaginal tear is separating.  You have painful, hard, or reddened breasts.  You have a severe headache.  You have blurred vision or see spots.  You feel sad or depressed.  You have thoughts of hurting yourself or your newborn.  You have questions about your care, the care of your newborn, or medicines.  You are dizzy or light-headed.  You have a rash.  You have nausea or vomiting.  You were breastfeeding and have not had a menstrual period within 12 weeks after you stopped breastfeeding.  You are not breastfeeding and have not had a menstrual period by the 12th week after delivery.  You have a fever. SEEK IMMEDIATE MEDICAL CARE IF:   You have persistent pain.  You have chest pain.  You have shortness of breath.  You faint.  You have leg pain.  You have stomach pain.  Your vaginal bleeding saturates two or more sanitary pads in 1 hour. MAKE SURE YOU:   Understand these instructions.  Will watch your condition.  Will get help right away if you are not doing well or get worse. Document Released: 09/14/2000  Document Revised: 02/01/2014 Document Reviewed: 05/14/2012 [facility] Patient Information 2015 [place], [place]. This information is not intended to replace advice given to you by your health care provider. Make sure you discuss any questions you have with your health care provider.  Sitz Bath A sitz bath is a warm water bath taken in the sitting position. The water covers only the hips and butt (buttocks). We recommend using one that fits in the toilet, to help with ease of use and cleanliness. It may be used for either healing or cleaning purposes. Sitz baths are also used to relieve pain, itching, or muscle tightening (spasms). The water may contain medicine. Moist heat will help you heal and relax.  HOME CARE  Take 3 to 4 sitz baths a day.  Fill the bathtub half-full with warm water.  Sit in the water and open the drain a little.  Turn on the warm water to keep the tub half-full. Keep the water running constantly.  Soak in the water for 15 to 20 minutes.  After the sitz bath, pat the affected area dry. GET HELP RIGHT AWAY IF: You get worse instead of better. Stop the sitz baths if you get worse. MAKE SURE YOU:  Understand these instructions.  Will watch your condition.  Will get help right away if you are not doing well or get worse. Document Released: 10/25/2004 Document Revised: 06/11/2012 Document Reviewed: 01/15/2011 [facility] Patient Information 2015 [place], [place]. This information is not intended to replace advice given to you by your health care provider. Make sure you discuss any questions you have with your health care provider.   Postpartum Hypertension Postpartum hypertension is high blood pressure after pregnancy that remains higher than normal for more than two days after delivery. You may not realize that you have postpartum hypertension if your blood pressure is not being checked regularly. In some cases, postpartum hypertension will go away on its own, usually within  a week of delivery. However, for some women, medical treatment is required to prevent serious complications, such as seizures or stroke. The following things can affect your blood pressure: The type of delivery you had. Having received IV fluids or other medicines during or after delivery. CAUSES  Postpartum hypertension may be caused by any of the following or by a combination of any of the following: Hypertension that existed before pregnancy (chronic hypertension). Gestational hypertension. Preeclampsia or eclampsia. Receiving a lot of fluid through an IV during or after delivery. Medicines. HELLP syndrome. Hyperthyroidism. Stroke. Other rare neurological or blood disorders. In some cases, the cause may not be known. RISK FACTORS Postpartum hypertension can be related to one or more risk factors, such as: Chronic hypertension. In some cases, this may not have been diagnosed before pregnancy. Obesity. Type 2 diabetes. Kidney disease. Family history of preeclampsia. Other medical conditions that cause hormonal imbalances. SIGNS AND SYMPTOMS As with all types of hypertension, postpartum hypertension may not have any symptoms. Depending on how high your blood pressure is, you may experience: Headaches. These may be mild, moderate, or severe. They may also be steady, constant, or sudden in onset (thunderclap headache). Visual changes. Dizziness. Shortness of breath. Swelling of  your hands, feet, lower legs, or face. In some cases, you may have swelling in more than one of these locations. Heart palpitations or a racing heartbeat. Difficulty breathing while lying down. Decreased urination. Other rare signs and symptoms may include: Sweating more than usual. This lasts longer than a few days after delivery. Chest pain. Sudden dizziness when you get up from sitting or lying down. Seizures. Nausea or vomiting. Abdominal pain. DIAGNOSIS The diagnosis of postpartum hypertension is  made through a combination of physical examination findings and testing of your blood and urine. You may also have additional tests, such as a CT scan or an MRI, to check for other complications of postpartum hypertension. TREATMENT When blood pressure is high enough to require treatment, your options may include: Medicines to reduce blood pressure (antihypertensives). Tell your health care provider if you are breastfeeding or if you plan to breastfeed. There are many antihypertensive medicines that are safe to take while breastfeeding. Stopping medicines that may be causing hypertension. Treating medical conditions that are causing hypertension. Treating the complications of hypertension, such as seizures, stroke, or kidney problems. Your health care provider will also continue to monitor your blood pressure closely and repeatedly until it is within a safe range for you.  HOME CARE INSTRUCTIONS Take medicines only as directed by your health care provider. Get regular exercise after your health care provider tells you that it is safe. Follow your health care provider's recommendations on fluid and salt restrictions. Do not use any tobacco products, including cigarettes, chewing tobacco, or electronic cigarettes. If you need help quitting, ask your health care provider. Keep all follow-up visits as directed by your health care provider. This is important. SEEK MEDICAL CARE IF: Your symptoms get worse. You have new symptoms, such as: Headache. Dizziness. Visual changes. SEEK IMMEDIATE MEDICAL CARE IF: You develop a severe or sudden headache. You have seizures. You develop numbness or weakness on one side of your body. You have difficulty thinking, speaking, or swallowing. You develop severe abdominal pain. You develop difficulty breathing, chest pain, a racing heartbeat, or heart palpitations. These symptoms may represent a serious problem that is an emergency. Do not wait to see if the  symptoms will go away. Get medical help right away. Call your local emergency services (911 in the [place] Do not drive yourself to the hospital.   This information is not intended to replace advice given to you by your health care provider. Make sure you discuss any questions you have with your health care provider.   Document Released: 05/21/2014 Document Reviewed: 05/21/2014 [employer] Interactive Patient Education [employer] Breastfeeding Deciding to breastfeed is one of the best choices you can make for you and your baby. A change in hormones during pregnancy causes your breast tissue to grow and increases the number and size of your milk ducts. These hormones also allow proteins, sugars, and fats from your blood supply to make breast milk in your milk-producing glands. Hormones prevent breast milk from being released before your baby is born as well as prompt milk flow after birth. Once breastfeeding has begun, thoughts of your baby, as well as his or her sucking or crying, can stimulate the release of milk from your milk-producing glands.  BENEFITS OF BREASTFEEDING For Your Baby  Your first milk (colostrum) helps your baby's digestive system function better.  There are antibodies in your milk that help your baby fight off infections.  Your baby has a lower incidence of asthma, allergies, and  sudden infant death syndrome.  The nutrients in breast milk are better for your baby than infant formulas and are designed uniquely for your baby's needs.  Breast milk improves your baby's brain development.  Your baby is less likely to develop other conditions, such as childhood obesity, asthma, or type 2 diabetes mellitus. For You  Breastfeeding helps to create a very special bond between you and your baby.  Breastfeeding is convenient. Breast milk is always available at the correct temperature and costs nothing.  Breastfeeding helps to burn calories and helps you lose the weight gained  during pregnancy.  Breastfeeding makes your uterus contract to its prepregnancy size faster and slows bleeding (lochia) after you give birth.   Breastfeeding helps to lower your risk of developing type 2 diabetes mellitus, osteoporosis, and breast or ovarian cancer later in life. SIGNS THAT YOUR BABY IS HUNGRY Early Signs of Hunger  Increased alertness or activity.  Stretching.  Movement of the head from side to side.  Movement of the head and opening of the mouth when the corner of the mouth or cheek is stroked (rooting).  Increased sucking sounds, smacking lips, cooing, sighing, or squeaking.  Hand-to-mouth movements.  Increased sucking of fingers or hands. Late Signs of Hunger  Fussing.  Intermittent crying. Extreme Signs of Hunger Signs of extreme hunger will require calming and consoling before your baby will be able to breastfeed successfully. Do not wait for the following signs of extreme hunger to occur before you initiate breastfeeding:  Restlessness.  A loud, strong cry.  Screaming. BREASTFEEDING BASICS Breastfeeding Initiation  Find a comfortable place to sit or lie down, with your neck and back well supported.  Place a pillow or rolled up blanket under your baby to bring him or her to the level of your breast (if you are seated). Nursing pillows are specially designed to help support your arms and your baby while you breastfeed.  Make sure that your baby's abdomen is facing your abdomen.  Gently massage your breast. With your fingertips, massage from your chest wall toward your nipple in a circular motion. This encourages milk flow. You may need to continue this action during the feeding if your milk flows slowly.  Support your breast with 4 fingers underneath and your thumb above your nipple. Make sure your fingers are well away from your nipple and your baby's mouth.  Stroke your baby's lips gently with your finger or nipple.  When your baby's mouth is  open wide enough, quickly bring your baby to your breast, placing your entire nipple and as much of the colored area around your nipple (areola) as possible into your baby's mouth.  More areola should be visible above your baby's upper lip than below the lower lip.  Your baby's tongue should be between his or her lower gum and your breast.  Ensure that your baby's mouth is correctly positioned around your nipple (latched). Your baby's lips should create a seal on your breast and be turned out (everted).  It is common for your baby to suck about 2-3 minutes in order to start the flow of breast milk. Latching Teaching your baby how to latch on to your breast properly is very important. An improper latch can cause nipple pain and decreased milk supply for you and poor weight gain in your baby. Also, if your baby is not latched onto your nipple properly, he or she may swallow some air during feeding. This can make your baby fussy. Burping your  baby when you switch breasts during the feeding can help to get rid of the air. However, teaching your baby to latch on properly is still the best way to prevent fussiness from swallowing air while breastfeeding. Signs that your baby has successfully latched on to your nipple:  Silent tugging or silent sucking, without causing you pain.  Swallowing heard between every 3-4 sucks.  Muscle movement above and in front of his or her ears while sucking. Signs that your baby has not successfully latched on to nipple:  Sucking sounds or smacking sounds from your baby while breastfeeding.  Nipple pain. If you think your baby has not latched on correctly, slip your finger into the corner of your baby's mouth to break the suction and place it between your baby's gums. Attempt breastfeeding initiation again. Signs of Successful Breastfeeding Signs from your baby:  A gradual decrease in the number of sucks or complete cessation of sucking.  Falling  asleep.  Relaxation of his or her body.  Retention of a small amount of milk in his or her mouth.  Letting go of your breast by himself or herself. Signs from you:  Breasts that have increased in firmness, weight, and size 1-3 hours after feeding.  Breasts that are softer immediately after breastfeeding.  Increased milk volume, as well as a change in milk consistency and color by the fifth day of breastfeeding.  Nipples that are not sore, cracked, or bleeding. Signs That Your Pecola Leisure is Getting Enough Milk  Wetting at least 3 diapers in a 24-hour period. The urine should be clear and pale yellow by age 240 days.  At least 3 stools in a 24-hour period by age 240 days. The stool should be soft and yellow.  At least 3 stools in a 24-hour period by age 23 days. The stool should be seedy and yellow.  No loss of weight greater than 10% of birth weight during the first 43 days of age.  Average weight gain of 4-7 ounces (113-198 g) per week after age 63 days.  Consistent daily weight gain by age 240 days, without weight loss after the age of 2 weeks. After a feeding, your baby may spit up a small amount. This is common. BREASTFEEDING FREQUENCY AND DURATION Frequent feeding will help you make more milk and can prevent sore nipples and breast engorgement. Breastfeed when you feel the need to reduce the fullness of your breasts or when your baby shows signs of hunger. This is called "breastfeeding on demand." Avoid introducing a pacifier to your baby while you are working to establish breastfeeding (the first 4-6 weeks after your baby is born). After this time you may choose to use a pacifier. Research has shown that pacifier use during the first year of a baby's life decreases the risk of sudden infant death syndrome (SIDS). Allow your baby to feed on each breast as long as he or she wants. Breastfeed until your baby is finished feeding. When your baby unlatches or falls asleep while feeding from the first  breast, offer the second breast. Because newborns are often sleepy in the first few weeks of life, you may need to awaken your baby to get him or her to feed. Breastfeeding times will vary from baby to baby. However, the following rules can serve as a guide to help you ensure that your baby is properly fed:  Newborns (babies 92 weeks of age or younger) may breastfeed every 1-3 hours.  Newborns should not go longer  than 3 hours during the day or 5 hours during the night without breastfeeding.  You should breastfeed your baby a minimum of 8 times in a 24-hour period until you begin to introduce solid foods to your baby at around 32 months of age. BREAST MILK PUMPING Pumping and storing breast milk allows you to ensure that your baby is exclusively fed your breast milk, even at times when you are unable to breastfeed. This is especially important if you are going back to work while you are still breastfeeding or when you are not able to be present during feedings. Your lactation consultant can give you guidelines on how long it is safe to store breast milk. A breast pump is a machine that allows you to pump milk from your breast into a sterile bottle. The pumped breast milk can then be stored in a refrigerator or freezer. Some breast pumps are operated by hand, while others use electricity. Ask your lactation consultant which type will work best for you. Breast pumps can be purchased, but some hospitals and breastfeeding support groups lease breast pumps on a monthly basis. A lactation consultant can teach you how to hand express breast milk, if you prefer not to use a pump. CARING FOR YOUR BREASTS WHILE YOU BREASTFEED Nipples can become dry, cracked, and sore while breastfeeding. The following recommendations can help keep your breasts moisturized and healthy:  Avoid using soap on your nipples.  Wear a supportive bra. Although not required, special nursing bras and tank tops are designed to allow access  to your breasts for breastfeeding without taking off your entire bra or top. Avoid wearing underwire-style bras or extremely tight bras.  Air dry your nipples for 3-56minutes after each feeding.  Use only cotton bra pads to absorb leaked breast milk. Leaking of breast milk between feedings is normal.  Use lanolin on your nipples after breastfeeding. Lanolin helps to maintain your skin's normal moisture barrier. If you use pure lanolin, you do not need to wash it off before feeding your baby again. Pure lanolin is not toxic to your baby. You may also hand express a few drops of breast milk and gently massage that milk into your nipples and allow the milk to air dry. In the first few weeks after giving birth, some women experience extremely full breasts (engorgement). Engorgement can make your breasts feel heavy, warm, and tender to the touch. Engorgement peaks within 3-5 days after you give birth. The following recommendations can help ease engorgement:  Completely empty your breasts while breastfeeding or pumping. You may want to start by applying warm, moist heat (in the shower or with warm water-soaked hand towels) just before feeding or pumping. This increases circulation and helps the milk flow. If your baby does not completely empty your breasts while breastfeeding, pump any extra milk after he or she is finished.  Wear a snug bra (nursing or regular) or tank top for 1-2 days to signal your body to slightly decrease milk production.  Apply ice packs to your breasts, unless this is too uncomfortable for you.  Make sure that your baby is latched on and positioned properly while breastfeeding. If engorgement persists after 48 hours of following these recommendations, contact your health care provider or a Advertising copywriter. OVERALL HEALTH CARE RECOMMENDATIONS WHILE BREASTFEEDING  Eat healthy foods. Alternate between meals and snacks, eating 3 of each per day. Because what you eat affects your  breast milk, some of the foods may make your baby more  irritable than usual. Avoid eating these foods if you are sure that they are negatively affecting your baby.  Drink milk, fruit juice, and water to satisfy your thirst (about 10 glasses a day).  Rest often, relax, and continue to take your prenatal vitamins to prevent fatigue, stress, and anemia.  Continue breast self-awareness checks.  Avoid chewing and smoking tobacco. Chemicals from cigarettes that pass into breast milk and exposure to secondhand smoke may harm your baby.  Avoid alcohol and drug use, including marijuana. Some medicines that may be harmful to your baby can pass through breast milk. It is important to ask your health care provider before taking any medicine, including all over-the-counter and prescription medicine as well as vitamin and herbal supplements. It is possible to become pregnant while breastfeeding. If birth control is desired, ask your health care provider about options that will be safe for your baby. SEEK MEDICAL CARE IF:  You feel like you want to stop breastfeeding or have become frustrated with breastfeeding.  You have painful breasts or nipples.  Your nipples are cracked or bleeding.  Your breasts are red, tender, or warm.  You have a swollen area on either breast.  You have a fever or chills.  You have nausea or vomiting.  You have drainage other than breast milk from your nipples.  Your breasts do not become full before feedings by the fifth day after you give birth.  You feel sad and depressed.  Your baby is too sleepy to eat well.  Your baby is having trouble sleeping.   Your baby is wetting less than 3 diapers in a 24-hour period.  Your baby has less than 3 stools in a 24-hour period.  Your baby's skin or the white part of his or her eyes becomes yellow.   Your baby is not gaining weight by 62 days of age. SEEK IMMEDIATE MEDICAL CARE IF:  Your baby is overly tired  (lethargic) and does not want to wake up and feed.  Your baby develops an unexplained fever.   This information is not intended to replace advice given to you by your health care provider. Make sure you discuss any questions you have with your health care provider.   Document Released: 09/17/2005 Document Revised: 06/08/2015 Document Reviewed: 03/11/2013 [employer] Interactive Patient Education [employer]

## 2015-09-27 NOTE — Progress Notes (Signed)
Patient ID: Jessica Espinoza, female   DOB: 10/20/1985, 29 y.o.   MRN: [id number] Obstetric Postpartum Daily Progress Note Subjective:  29 y.o. G3P3003 postpartum day #1 status post vaginal delivery.  She is ambulating, is tolerating po, is voiding spontaneously.  Her pain is well controlled on PO pain medications. Her lochia is less than menses.  She denies headaches, visual disturbances, and RUQ pain.     Medications SCHEDULED MEDICATIONS  . ibuprofen  600 mg Oral 4 times per day  . prenatal multivitamin  1 tablet Oral Q1200  . sertraline  50 mg Oral Daily    MEDICATION INFUSIONS  . oxytocin 40 units in LR 1000 mL Stopped (09/26/15 1245)    PRN MEDICATIONS  acetaminophen, benzocaine-Menthol, witch hazel-glycerin **AND** dibucaine, diphenhydrAMINE, docusate sodium, lanolin, magnesium hydroxide, ondansetron **OR** ondansetron (ZOFRAN) IV, oxyCODONE-acetaminophen, oxytocin 40 units in LR 1000 mL, simethicone   Objective:   Filed Vitals:   09/26/15 1900 09/26/15 2351 09/27/15 0414 09/27/15 0717  BP: 130/89 147/82 122/67 128/75  Pulse: 88 79 65 69  Temp: 98.8 F (37.1 C) 98.3 F (36.8 C) 98.3 F (36.8 C) 98.5 F (36.9 C)  TempSrc: Oral Oral Oral Oral  Resp: 18 18 20 18   Height:      Weight:      SpO2:        Current Vital Signs 24h Vital Sign Ranges  T 98.5 F (36.9 C) Temp  Avg: 98.6 F (37 C)  Min: 98.3 F (36.8 C)  Max: 98.8 F (37.1 C)  BP 128/75 mmHg BP  Min: 122/67  Max: 149/82  HR 69 Pulse  Avg: 74.8  Min: 65  Max: 88  RR 18 Resp  Avg: 18.3  Min: 18  Max: 20  SaO2 97 %   SpO2  Avg: 97 %  Min: 97 %  Max: 97 %       24 Hour I/O Current Shift I/O  Time Ins Outs 12/26 0701 - 12/27 0700 In: 1863 [P.O.:600; I.V.:1263] Out: 550 [Urine:550]    General: NAD Pulmonary: no increased work of breathing Abdomen: non-distended, non-tender, fundus firm at level of umbilicus Extremities: no edema, no erythema, no tenderness  Labs:  Recent Labs     09/26/15  0444  HGB   13.9  HCT  41.2  PLT  140*     Assessment:   29 y.o. G3P3003 postpartum day # 1 status post SVD complicated by gestational hypertension.  She has had a couple severe-range pressures since admission and at least one since delivery (though not noted in the vitals pulled in by the EPIC system). She has no symptoms.    Plan:   1) Gestational hypertension: continue to monitor today. Improved overall since delivery.    2) A POS / Rubella Immune/ Varicella Immune  3) TDAP status up-to-date  4) breast /Contraception = undecided  5) Disposition: home tomorrow.  Thomasene MohairStephen Maxime Beckner, MD 09/27/2015 8:59 AM

## 2015-09-28 LAB — RPR: RPR: NONREACTIVE

## 2015-09-28 NOTE — Progress Notes (Signed)
Patient discharged home with infant and spouse. Discharge instructions, prescriptions and follow up appointment given to and reviewed with patient and spouse. Patient verbalized understanding. Escorted by out with infant by auxillary.

## 2015-09-28 NOTE — Progress Notes (Signed)
Transponder removed

## 2016-03-07 DIAGNOSIS — F32A Depression, unspecified: Secondary | ICD-10-CM | POA: Insufficient documentation

## 2017-03-21 ENCOUNTER — Inpatient Hospital Stay
Admission: EM | Admit: 2017-03-21 | Discharge: 2017-03-22 | DRG: 603 | Disposition: A | Discharge status: Home / Self Care | Payer: Worker's Compensation | Attending: Internal Medicine | Admitting: Internal Medicine

## 2017-03-21 ENCOUNTER — Encounter: Payer: Self-pay | Admitting: *Deleted

## 2017-03-21 DIAGNOSIS — W5501XA Bitten by cat, initial encounter: Secondary | ICD-10-CM

## 2017-03-21 DIAGNOSIS — Z79899 Other long term (current) drug therapy: Secondary | ICD-10-CM

## 2017-03-21 DIAGNOSIS — E669 Obesity, unspecified: Secondary | ICD-10-CM | POA: Diagnosis present

## 2017-03-21 DIAGNOSIS — Z87891 Personal history of nicotine dependence: Secondary | ICD-10-CM

## 2017-03-21 DIAGNOSIS — Z6837 Body mass index (BMI) 37.0-37.9, adult: Secondary | ICD-10-CM

## 2017-03-21 DIAGNOSIS — S61451A Open bite of right hand, initial encounter: Secondary | ICD-10-CM | POA: Diagnosis present

## 2017-03-21 DIAGNOSIS — Z792 Long term (current) use of antibiotics: Secondary | ICD-10-CM | POA: Diagnosis not present

## 2017-03-21 DIAGNOSIS — L03113 Cellulitis of right upper limb: Principal | ICD-10-CM | POA: Diagnosis present

## 2017-03-21 DIAGNOSIS — L0390 Cellulitis, unspecified: Secondary | ICD-10-CM | POA: Diagnosis present

## 2017-03-21 DIAGNOSIS — L03011 Cellulitis of right finger: Secondary | ICD-10-CM

## 2017-03-21 LAB — CBC
HCT: 38.8 % (ref 35.0–47.0)
HCT: 37.6 % (ref 35.0–47.0)
HCT: 36.7 % (ref 35.0–47.0)
HEMOGLOBIN: 13.1 g/dL (ref 12.0–16.0)
Hemoglobin: 12.9 g/dL (ref 12.0–16.0)
Hemoglobin: 12.2 g/dL (ref 12.0–16.0)
MCH: 26.4 pg (ref 26.0–34.0)
MCH: 27 pg (ref 26.0–34.0)
MCH: 27 pg (ref 26.0–34.0)
MCHC: 33.7 g/dL (ref 32.0–36.0)
MCHC: 34.2 g/dL (ref 32.0–36.0)
MCHC: 33.3 g/dL (ref 32.0–36.0)
MCV: 79.9 fL — ABNORMAL LOW (ref 80.0–100.0)
MCV: 78.7 fL — AB (ref 80.0–100.0)
MCV: 79.2 fL — AB (ref 80.0–100.0)
PLATELETS: 193 10*3/uL (ref 150–440)
PLATELETS: 194 10*3/uL (ref 150–440)
Platelets: 202 10*3/uL (ref 150–440)
RBC: 4.63 MIL/uL (ref 3.80–5.20)
RBC: 4.78 MIL/uL (ref 3.80–5.20)
RBC: 4.85 MIL/uL (ref 3.80–5.20)
RDW: 13.2 % (ref 11.5–14.5)
RDW: 13.4 % (ref 11.5–14.5)
RDW: 13.3 % (ref 11.5–14.5)
WBC: 7.3 10*3/uL (ref 3.6–11.0)
WBC: 7.8 10*3/uL (ref 3.6–11.0)
WBC: 8.1 10*3/uL (ref 3.6–11.0)

## 2017-03-21 LAB — BASIC METABOLIC PANEL
Anion gap: 6 (ref 5–15)
BUN: 11 mg/dL (ref 6–20)
CALCIUM: 8.7 mg/dL — AB (ref 8.9–10.3)
CO2: 27 mmol/L (ref 22–32)
CREATININE: 0.44 mg/dL (ref 0.44–1.00)
Chloride: 105 mmol/L (ref 101–111)
GFR calc Af Amer: >60 mL/min (ref >60)
Glucose, Bld: 104 mg/dL — ABNORMAL HIGH (ref 65–99)
Potassium: 3.7 mmol/L (ref 3.5–5.1)
Sodium: 138 mmol/L (ref 135–145)

## 2017-03-21 LAB — COMPREHENSIVE METABOLIC PANEL
ALBUMIN: 4 g/dL (ref 3.5–5.0)
ALK PHOS: 83 U/L (ref 38–126)
ALT: 26 U/L (ref 14–54)
AST: 20 U/L (ref 15–41)
Anion gap: 4 — ABNORMAL LOW (ref 5–15)
BUN: 9 mg/dL (ref 6–20)
CO2: 30 mmol/L (ref 22–32)
CREATININE: 0.51 mg/dL (ref 0.44–1.00)
Calcium: 9.3 mg/dL (ref 8.9–10.3)
Chloride: 107 mmol/L (ref 101–111)
GFR calc Af Amer: >60 mL/min (ref >60)
GFR calc non Af Amer: >60 mL/min (ref >60)
GLUCOSE: 108 mg/dL — AB (ref 65–99)
Potassium: 4 mmol/L (ref 3.5–5.1)
SODIUM: 141 mmol/L (ref 135–145)
Total Bilirubin: 0.9 mg/dL (ref 0.3–1.2)
Total Protein: 7.2 g/dL (ref 6.5–8.1)

## 2017-03-21 MED ORDER — ACETAMINOPHEN 650 MG RE SUPP: 650.0000 mg | Freq: Four times a day (QID) | RECTAL | Status: DC | PRN

## 2017-03-21 MED ORDER — DEXTROSE 5 % IV SOLN
2.0000 g | INTRAVENOUS | Status: DC
  Filled 2017-03-21: qty 2

## 2017-03-21 MED ORDER — ONDANSETRON HCL 4 MG/2ML IJ SOLN: 4.0000 mg | Freq: Four times a day (QID) | INTRAMUSCULAR | Status: DC | PRN

## 2017-03-21 MED ORDER — PIPERACILLIN-TAZOBACTAM 3.375 G IVPB
3.3750 g | Freq: Once | INTRAVENOUS | Status: AC
  Administered 2017-03-21: 3.375 g via INTRAVENOUS
  Filled 2017-03-21: qty 50

## 2017-03-21 MED ORDER — SERTRALINE HCL 100 MG PO TABS
100.0000 mg | ORAL_TABLET | Freq: Every day | ORAL | Status: DC
  Administered 2017-03-21 – 2017-03-22 (×2): 100 mg via ORAL
  Filled 2017-03-21: qty 1
  Filled 2017-03-21: qty 2

## 2017-03-21 MED ORDER — HYDROCODONE-ACETAMINOPHEN 5-325 MG PO TABS
1.0000 | ORAL_TABLET | ORAL | Status: DC | PRN
Start: 2017-03-21 — End: 2017-03-22
  Administered 2017-03-21 – 2017-03-22 (×2): 1 via ORAL
  Filled 2017-03-21 (×2): qty 1

## 2017-03-21 MED ORDER — SODIUM CHLORIDE 0.9 % IV SOLN
3.0000 g | Freq: Four times a day (QID) | INTRAVENOUS | Status: DC
  Administered 2017-03-21 – 2017-03-22 (×4): 3 g via INTRAVENOUS
  Filled 2017-03-21 (×6): qty 3

## 2017-03-21 MED ORDER — SODIUM CHLORIDE 0.9% FLUSH: 3.0000 mL | INTRAVENOUS | Status: DC | PRN

## 2017-03-21 MED ORDER — ACETAMINOPHEN 325 MG PO TABS
650.0000 mg | ORAL_TABLET | Freq: Four times a day (QID) | ORAL | Status: DC | PRN
  Administered 2017-03-21: 650 mg via ORAL
  Filled 2017-03-21: qty 2

## 2017-03-21 MED ORDER — SODIUM CHLORIDE 0.9 % IV SOLN: 250.0000 mL | INTRAVENOUS | Status: DC | PRN

## 2017-03-21 MED ORDER — ONDANSETRON HCL 4 MG PO TABS: 4.0000 mg | ORAL_TABLET | Freq: Four times a day (QID) | ORAL | Status: DC | PRN

## 2017-03-21 MED ORDER — SODIUM CHLORIDE 0.9% FLUSH
3.0000 mL | Freq: Two times a day (BID) | INTRAVENOUS | Status: DC
  Administered 2017-03-21 – 2017-03-22 (×3): 3 mL via INTRAVENOUS

## 2017-03-21 MED ORDER — SENNOSIDES-DOCUSATE SODIUM 8.6-50 MG PO TABS: 1.0000 | ORAL_TABLET | Freq: Every evening | ORAL | Status: DC | PRN

## 2017-03-21 MED ORDER — ENOXAPARIN SODIUM 40 MG/0.4ML ~~LOC~~ SOLN: 40.0000 mg | SUBCUTANEOUS | Status: DC

## 2017-03-21 NOTE — Progress Notes (Signed)
Pharmacy Antibiotic Note  Jessica Espinoza is a 31 y.o. female admitted on 03/21/2017 with a cat bite.  Pharmacy has been consulted for ampicillin/sulbactam dosing.  Plan: Ampicillin/sulbactam 3 g IV q6h  Height: 5\' 4"  (162.6 cm) Weight: 210 lb (95.3 kg) IBW/kg (Calculated) : 54.7  Temp (24hrs), Avg:98.8 F (37.1 C), Min:98.3 F (36.8 C), Max:99.3 F (37.4 C)   Recent Labs Lab 03/21/17 0053 03/21/17 0226 03/21/17 0501  WBC 7.8 8.1 7.3  CREATININE 0.51  --  0.44    Estimated Creatinine Clearance: 115.1 mL/min (by C-G formula based on SCr of 0.44 mg/dL).    No Known Allergies  Antimicrobials this admission: Ampicillin/sulbactam 6/21 >>   Dose adjustments this admission:  Microbiology results: 6/21 BCx: No growth < 12 hours  Thank you for allowing pharmacy to be a part of this patient's care.  Cindi CarbonMary M Tailynn Armetta, PharmD, BCPS Clinical Pharmacist 03/21/2017 2:22 PM

## 2017-03-21 NOTE — Progress Notes (Signed)
[facility] - [place] at [facility]                                                                                                                                                                                  Patient Demographics   [facility], is a 31 y.o. female, DOB - 12/27/1985, ZOX:[id number]  Admit date - 03/21/2017   Admitting Physician Ihor AustinPavan Pyreddy, MD  Outpatient Primary MD for the patient is Patient, No Pcp Per   LOS - 0  Subjective: Reports that the right hand is little improved. But still swollen    Review of Systems:   CONSTITUTIONAL: No documented fever. No fatigue, weakness. No weight gain, no weight loss.  EYES: No blurry or double vision.  ENT: No tinnitus. No postnasal drip. No redness of the oropharynx.  RESPIRATORY: No cough, no wheeze, no hemoptysis. No dyspnea.  CARDIOVASCULAR: No chest pain. No orthopnea. No palpitations. No syncope.  GASTROINTESTINAL: No nausea, no vomiting or diarrhea. No abdominal pain. No melena or hematochezia.  GENITOURINARY: No dysuria or hematuria.  ENDOCRINE: No polyuria or nocturia. No heat or cold intolerance.  HEMATOLOGY: No anemia. No bruising. No bleeding.  INTEGUMENTARY: Right hand and forearm erythema and swelling MUSCULOSKELETAL: No arthritis. No swelling. No gout.  NEUROLOGIC: No numbness, tingling, or ataxia. No seizure-type activity.  PSYCHIATRIC: No anxiety. No insomnia. No ADD.    Vitals:   Vitals:   03/21/17 0039 03/21/17 0234 03/21/17 0409 03/21/17 0457  BP:  (!) 143/87 124/73 138/62  Pulse:  72 65 64  Resp:  20 18 18   Temp:    98.7 F (37.1 C)  TempSrc:    Oral  SpO2:  99% 98% 100%  Weight: 210 lb (95.3 kg)     Height: 5\' 4"  (1.626 m)       Wt Readings from Last 3 Encounters:  03/21/17 210 lb (95.3 kg)  09/26/15 238 lb (108 kg)  07/07/15 225 lb (102.1 kg)     Intake/Output Summary (Last 24 hours) at 03/21/17 1333 Last data filed at 03/21/17 0930  Gross per 24 hour  Intake               170 ml  Output                0 ml  Net              170 ml    Physical Exam:   GENERAL: Pleasant-appearing in no apparent distress.  HEAD, EYES, EARS, NOSE AND THROAT: Atraumatic, normocephalic. Extraocular muscles are intact. Pupils equal and reactive to light. Sclerae anicteric. No conjunctival injection. No oro-pharyngeal erythema.  NECK: Supple. There  is no jugular venous distention. No bruits, no lymphadenopathy, no thyromegaly.  HEART: Regular rate and rhythm,. No murmurs, no rubs, no clicks.  LUNGS: Clear to auscultation bilaterally. No rales or rhonchi. No wheezes.  ABDOMEN: Soft, flat, nontender, nondistended. Has good bowel sounds. No hepatosplenomegaly appreciated.  EXTREMITIES: No evidence of any cyanosis, clubbing, or peripheral edema.  +2 pedal and radial pulses bilaterally.  NEUROLOGIC: The patient is alert, awake, and oriented x3 with no focal motor or sensory deficits appreciated bilaterally.  SKIN: Right hand and forearm erythema  Psych: Not anxious, depressed LN: No inguinal LN enlargement    Antibiotics   Anti-infectives    Start     Dose/Rate Route Frequency Ordered Stop   03/21/17 1600  cefTRIAXone (ROCEPHIN) 2 g in dextrose 5 % 50 mL IVPB     2 g 100 mL/hr over 30 Minutes Intravenous Every 24 hours 03/21/17 0323     03/21/17 0230  piperacillin-tazobactam (ZOSYN) IVPB 3.375 g     3.375 g 12.5 mL/hr over 240 Minutes Intravenous  Once 03/21/17 0228 03/21/17 0700      Medications   Scheduled Meds: . enoxaparin (LOVENOX) injection  40 mg Subcutaneous Q24H  . sertraline  100 mg Oral Daily  . sodium chloride flush  3 mL Intravenous Q12H   Continuous Infusions: . sodium chloride    . cefTRIAXone (ROCEPHIN)  IV     PRN Meds:.sodium chloride, acetaminophen **OR** acetaminophen, HYDROcodone-acetaminophen, ondansetron **OR** ondansetron (ZOFRAN) IV, senna-docusate, sodium chloride flush   Data Review:   Micro Results Recent Results (from the  past 240 hour(s))  CULTURE, BLOOD (ROUTINE X 2) w Reflex to ID Panel     Status: None (Preliminary result)   Collection Time: 03/21/17  2:27 AM  Result Value Ref Range Status   Specimen Description BLOOD LT [facility]  Final   Special Requests   Final    BOTTLES DRAWN AEROBIC AND ANAEROBIC Blood Culture adequate volume   Culture NO GROWTH < 12 HOURS  Final   Report Status PENDING  Incomplete  CULTURE, BLOOD (ROUTINE X 2) w Reflex to ID Panel     Status: None (Preliminary result)   Collection Time: 03/21/17  2:32 AM  Result Value Ref Range Status   Specimen Description BLOOD LT [facility]RIST  Final   Special Requests   Final    BOTTLES DRAWN AEROBIC AND ANAEROBIC Blood Culture adequate volume   Culture NO GROWTH < 12 HOURS  Final   Report Status PENDING  Incomplete    Radiology Reports No results found.   CBC  Recent Labs Lab 03/21/17 0053 03/21/17 0226 03/21/17 0501  WBC 7.8 8.1 7.3  HGB 13.1 12.9 12.2  HCT 38.8 37.6 36.7  PLT 202 193 194  MCV 79.9* 78.7* 79.2*  MCH 27.0 27.0 26.4  MCHC 33.7 34.2 33.3  RDW 13.2 13.4 13.3    Chemistries   Recent Labs Lab 03/21/17 0053 03/21/17 0501  NA 141 138  K 4.0 3.7  CL 107 105  CO2 30 27  GLUCOSE 108* 104*  BUN 9 11  CREATININE 0.51 0.44  CALCIUM 9.3 8.7*  AST 20  --   ALT 26  --   ALKPHOS 83  --   BILITOT 0.9  --    ------------------------------------------------------------------------------------------------------------------ estimated creatinine clearance is 115.1 mL/min (by C-G formula based on SCr of 0.44 mg/dL). ------------------------------------------------------------------------------------------------------------------ No results for input(s): HGBA1C in the last 72 hours. ------------------------------------------------------------------------------------------------------------------ No results for input(s): CHOL, HDL, LDLCALC, TRIG, CHOLHDL, LDLDIRECT in  the last 72  hours. ------------------------------------------------------------------------------------------------------------------ No results for input(s): TSH, T4TOTAL, T3FREE, THYROIDAB in the last 72 hours.  Invalid input(s): FREET3 ------------------------------------------------------------------------------------------------------------------ No results for input(s): VITAMINB12, FOLATE, FERRITIN, TIBC, IRON, RETICCTPCT in the last 72 hours.  Coagulation profile No results for input(s): INR, PROTIME in the last 168 hours.  No results for input(s): DDIMER in the last 72 hours.  Cardiac Enzymes No results for input(s): CKMB, TROPONINI, MYOGLOBIN in the last 168 hours.  Invalid input(s): CK ------------------------------------------------------------------------------------------------------------------ Invalid input(s): POCBNP    Assessment & Plan   IMPRESSION AND PLAN: 31 year old female patient with no past medical history presented to the emergency room doctor she had a cat bite on the right hand. Patient has redness in the right hand arm and forearm. Admitting diagnosis 1. Right hand and forearm  Cellulitis I will discontinue ceftriaxone use Unasyn 2. Cat bite as above supportive care 3. Lovenox for DVT prophylaxis     Code Status Orders        Start     Ordered   03/21/17 0453  Full code  Continuous     03/21/17 0452    Code Status History    Date Active Date Inactive Code Status Order ID Comments User Context   09/26/2015  5:57 AM 09/28/2015  4:09 PM Full Code [id number]  [place] Bing, MD Inpatient   09/26/2015  2:36 AM 09/26/2015  5:57 AM Full Code [id number]  [place] Bing, MD Inpatient   07/07/2015  4:54 PM 07/07/2015 11:50 PM Full Code [id number]  Conard Novak, MD Inpatient           Consults  none  DVT Prophylaxis  Lovenox   Lab Results  Component Value Date   PLT 194 03/21/2017     Time Spent in minutes  Greater than 50% of time  spent in care coordination and counseling patient regarding the condition and plan of care.   [place] Bilberry M.D on 03/21/2017 at 1:33 PM  Between 7am to 6pm - Pager - [phone]  After 6pm go to [email or web address] - password [email or web address] [facility]  [facility] [place] [facility]   Office  [phone]

## 2017-03-21 NOTE — H&P (Signed)
[facility]  at [facility]   PATIENT NAME: Jessica Espinoza    MR#:  [id number]  DATE OF BIRTH:  08/04/1986  DATE OF ADMISSION:  03/21/2017  PRIMARY CARE PHYSICIAN: No primary care provider on file.   REQUESTING/REFERRING PHYSICIAN:   CHIEF COMPLAINT:   Chief Complaint  Patient presents with  . Animal Bite    HISTORY OF PRESENT ILLNESS: Jessica Espinoza  is a 31 y.o. female with No significant past medical history presented to the emergency room after she had a cat bite on the right hand 2 days ago. Patient saw [facility] and she is taking oral Augmentin as prescribed. She took 2 pills and her redness has increased from the right hand to the right forearm and right arm up to the axilla. She presented to the emergency room. There is pain in the right hand and right arm and forearm which is aching in nature 7 out of 10 on a scale of 1-10. Patient was given IV Zosyn antibiotic in the emergency room and hospitalist service was consulted. No complaints of any chest pain, shortness of breath. Patient works for [employer] and while she was putting the Cat in the kernel, she was bitten.  PAST MEDICAL HISTORY:   Past Medical History:  Diagnosis Date  . BMI 37.0-37.9, adult   . Obesity     PAST SURGICAL HISTORY: Past Surgical History:  Procedure Laterality Date  . NO PAST SURGERIES      SOCIAL HISTORY:  Social History  Substance Use Topics  . Smoking status: Former Smoker    Types: Cigarettes    Quit date: 05/07/2008  . Smokeless tobacco: Never Used  . Alcohol use No    FAMILY HISTORY:  Family History  Problem Relation Age of Onset  . CAD Neg Hx   . Hypertension Neg Hx   . Diabetes Neg Hx     DRUG ALLERGIES: No Known Allergies  REVIEW OF SYSTEMS:   CONSTITUTIONAL: No fever, fatigue or weakness.  EYES: No blurred or double vision.  EARS, NOSE, AND THROAT: No tinnitus or ear pain.  RESPIRATORY: No cough, shortness of breath, wheezing  or hemoptysis.  CARDIOVASCULAR: No chest pain, orthopnea, edema.  GASTROINTESTINAL: No nausea, vomiting, diarrhea or abdominal pain.  GENITOURINARY: No dysuria, hematuria.  ENDOCRINE: No polyuria, nocturia,  HEMATOLOGY: No anemia, easy bruising or bleeding SKIN: No rash or lesion. MUSCULOSKELETAL: cat bite right hand Redness right hand, arm and fore arm NEUROLOGIC: No tingling, numbness, weakness.  PSYCHIATRY: No anxiety or depression.   MEDICATIONS AT HOME:  Prior to Admission medications   Medication Sig Start Date End Date Taking? Authorizing Provider  amoxicillin-clavulanate (AUGMENTIN) 875-125 MG tablet Take 1 tablet by mouth 2 (two) times daily.   Yes [provider]  sertraline (ZOLOFT) 100 MG tablet Take 100 mg by mouth daily.   Yes [provider]      PHYSICAL EXAMINATION:   VITAL SIGNS: Blood pressure (!) 143/87, pulse 72, temperature 99.3 F (37.4 C), temperature source Oral, resp. rate 20, height 5\' 4"  (1.626 m), weight 95.3 kg (210 lb), last menstrual period 03/07/2017, SpO2 99 %, unknown if currently breastfeeding.  GENERAL:  31 y.o.-year-old patient lying in the bed with no acute distress.  EYES: Pupils equal, round, reactive to light and accommodation. No scleral icterus. Extraocular muscles intact.  HEENT: Head atraumatic, normocephalic. Oropharynx and nasopharynx clear.  NECK:  Supple, no jugular venous distention. No thyroid enlargement, no tenderness.  LUNGS: Normal  breath sounds bilaterally, no wheezing, rales,rhonchi or crepitation. No use of accessory muscles of respiration.  CARDIOVASCULAR: S1, S2 normal. No murmurs, rubs, or gallops.  ABDOMEN: Soft, nontender, nondistended. Bowel sounds present. No organomegaly or mass.  EXTREMITIES: No pedal edema, cyanosis, or clubbing.  Redness right arm and fore arm Swelling right hand NEUROLOGIC: Cranial nerves II through XII are intact. Muscle strength 5/5 in all extremities. Sensation intact. Gait  not checked.  PSYCHIATRIC: The patient is alert and oriented x 3.  SKIN: wound with skin break down right hand between thumb and index finger.  LABORATORY PANEL:   CBC  Recent Labs Lab 03/21/17 0053 03/21/17 0226  WBC 7.8 8.1  HGB 13.1 12.9  HCT 38.8 37.6  PLT 202 193  MCV 79.9* 78.7*  MCH 27.0 27.0  MCHC 33.7 34.2  RDW 13.2 13.4   ------------------------------------------------------------------------------------------------------------------  Chemistries   Recent Labs Lab 03/21/17 0053  NA 141  K 4.0  CL 107  CO2 30  GLUCOSE 108*  BUN 9  CREATININE 0.51  CALCIUM 9.3  AST 20  ALT 26  ALKPHOS 83  BILITOT 0.9   ------------------------------------------------------------------------------------------------------------------ estimated creatinine clearance is 115.1 mL/min (by C-G formula based on SCr of 0.51 mg/dL). ------------------------------------------------------------------------------------------------------------------ No results for input(s): TSH, T4TOTAL, T3FREE, THYROIDAB in the last 72 hours.  Invalid input(s): FREET3   Coagulation profile No results for input(s): INR, PROTIME in the last 168 hours. ------------------------------------------------------------------------------------------------------------------- No results for input(s): DDIMER in the last 72 hours. -------------------------------------------------------------------------------------------------------------------  Cardiac Enzymes No results for input(s): CKMB, TROPONINI, MYOGLOBIN in the last 168 hours.  Invalid input(s): CK ------------------------------------------------------------------------------------------------------------------ Invalid input(s): POCBNP  ---------------------------------------------------------------------------------------------------------------  Urinalysis    Component Value Date/Time   COLORURINE YELLOW (A) 07/07/2015 1700   APPEARANCEUR  CLEAR (A) 07/07/2015 1700   LABSPEC 1.021 07/07/2015 1700   PHURINE 5.0 07/07/2015 1700   GLUCOSEU NEGATIVE 07/07/2015 1700   HGBUR NEGATIVE 07/07/2015 1700   BILIRUBINUR NEGATIVE 07/07/2015 1700   KETONESUR 2+ (A) 07/07/2015 1700   PROTEINUR 30 (A) 07/07/2015 1700   NITRITE NEGATIVE 07/07/2015 1700   LEUKOCYTESUR NEGATIVE 07/07/2015 1700     RADIOLOGY: No results found.  EKG: Orders placed or performed in visit on 02/20/10  . EKG 12-Lead    IMPRESSION AND PLAN: 31 year old female patient with no past medical history presented to the emergency room doctor she had a cat bite on the right hand. Patient has redness in the right hand arm and forearm. Admitting diagnosis 1. Right hand and forearm  Cellulitis 2. Cat bite  Treatment plan Admit patient to medical floor Start patient on IV Rocephin antibiotic 2 g daily Pain management with oral Percocet DVT prophylaxis with subcutaneous Lovenox 40 MG daily  All the records are reviewed and case discussed with ED provider. Management plans discussed with the patient, family and they are in agreement.  CODE STATUS:FULL CODE Code Status History    Date Active Date Inactive Code Status Order ID Comments User Context   09/26/2015  5:57 AM 09/28/2015  4:09 PM Full Code [id number]  [place] Bing, MD Inpatient   09/26/2015  2:36 AM 09/26/2015  5:57 AM Full Code [id number]  [place] Bing, MD Inpatient   07/07/2015  4:54 PM 07/07/2015 11:50 PM Full Code [id number]  Conard Novak, MD Inpatient       TOTAL TIME TAKING CARE OF THIS PATIENT: 29 minutes.    Ihor Austin M.D on 03/21/2017 at 3:38 AM  Between 7am to 6pm - Pager - [phone]  After 6pm  go to [email or web address] - password [email or web address] [facility]  [facility]  Office  [phone]  CC: Primary care physician; No primary care provider on file.

## 2017-03-21 NOTE — ED Triage Notes (Addendum)
Pt has cat bites to right hand with swelling and pain in right arm.  Pt has a red streak going up her arm.  Pt works in a [employer].  Pt alert.   Pt started amoxil yesterday.

## 2017-03-22 LAB — CBC
HEMATOCRIT: 38.1 % (ref 35.0–47.0)
HEMOGLOBIN: 12.9 g/dL (ref 12.0–16.0)
MCH: 26.9 pg (ref 26.0–34.0)
MCHC: 33.8 g/dL (ref 32.0–36.0)
MCV: 79.4 fL — ABNORMAL LOW (ref 80.0–100.0)
Platelets: 226 10*3/uL (ref 150–440)
RBC: 4.79 MIL/uL (ref 3.80–5.20)
RDW: 13.3 % (ref 11.5–14.5)
WBC: 6.8 10*3/uL (ref 3.6–11.0)

## 2017-03-22 LAB — HIV ANTIBODY (ROUTINE TESTING W REFLEX): HIV Screen 4th Generation wRfx: NONREACTIVE

## 2017-03-22 MED ORDER — HYDROCODONE-ACETAMINOPHEN 5-325 MG PO TABS: 1.0000 | ORAL_TABLET | ORAL | 0 refills | Status: DC | PRN

## 2017-03-22 MED ORDER — AMOXICILLIN-POT CLAVULANATE 875-125 MG PO TABS: 1.0000 | ORAL_TABLET | Freq: Two times a day (BID) | ORAL | 0 refills | Status: AC

## 2017-03-22 NOTE — Progress Notes (Signed)
[facility] - [place] at [facility]    Jessica Espinoza was admitted to the Hospital on 03/21/2017 and Discharged  03/22/2017 and should be excused from work/school   For  3 days starting 03/21/2017 , may return to work/school without any restrictions.  Call [place] BilberryShreyang Harneet Noblett MD with questions.  Auburn BilberryPATEL, Masaru Chamberlin M.D on 03/22/2017,at 10:55 AM  [facility] [place] [facility]    Office  [phone]

## 2017-03-22 NOTE — ED Provider Notes (Signed)
[facility] Emergency Department Provider Note    First MD Initiated Contact with Patient 03/21/17 0209     (approximate)  I have reviewed the triage vital signs and the nursing notes.   HISTORY  Chief Complaint Animal Bite    HPI Jessica Espinoza is a 31 y.o. female presents to the emergency department with history of being bit by a cat at her place of employment. The patient works at a [employer], learning disability and states that she was bit by a cat who was up today with all vaccinations. Patient was prescribed Augmentin of which she is taking 2 doses. Patient now presents to the emergency department with worsening right hand pain swelling and redness including streaks extending all the way to the patient's right axilla.   Past Medical History:  Diagnosis Date  . BMI 37.0-37.9, adult   . Obesity     Patient Active Problem List   Diagnosis Date Noted  . Cellulitis 03/21/2017  . Routine postpartum follow-up 09/28/2015  . Postpartum care and examination of lactating mother 09/28/2015  . Postpartum care following vaginal delivery 09/28/2015  . Supervision of normal pregnancy in third trimester 09/26/2015  . Nausea and vomiting during pregnancy 07/07/2015  . Obesity   . BMI 37.0-37.9, adult     Past Surgical History:  Procedure Laterality Date  . NO PAST SURGERIES      Prior to Admission medications   Medication Sig Start Date End Date Taking? Authorizing Provider  amoxicillin-clavulanate (AUGMENTIN) 875-125 MG tablet Take 1 tablet by mouth 2 (two) times daily.   Yes [provider]  sertraline (ZOLOFT) 100 MG tablet Take 100 mg by mouth daily.   Yes [provider]    Allergies No known drug allergies  Family History  Problem Relation Age of Onset  . CAD Neg Hx   . Hypertension Neg Hx   . Diabetes Neg Hx     Social History Social History  Substance Use Topics  . Smoking status: Former Smoker    Types: Cigarettes    Quit  date: 05/07/2008  . Smokeless tobacco: Never Used  . Alcohol use No    Review of Systems Constitutional: No fever/chills Eyes: No visual changes. ENT: No sore throat. Cardiovascular: Denies chest pain. Respiratory: Denies shortness of breath. Gastrointestinal: No abdominal pain.  No nausea, no vomiting.  No diarrhea.  No constipation. Genitourinary: Negative for dysuria. Musculoskeletal: Negative for neck pain.  Negative for back pain. Integumentary: Positive for right hand erythema with multiple puncture wounds noted with purulent drainage. Erythematous streaks extending to the patient's right axilla Neurological: Negative for headaches, focal weakness or numbness.   ____________________________________________   PHYSICAL EXAM:  VITAL SIGNS: ED Triage Vitals  Enc Vitals Group     BP 03/21/17 0037 (!) 152/90     Pulse Rate 03/21/17 0037 66     Resp 03/21/17 0037 18     Temp 03/21/17 0037 99.3 F (37.4 C)     Temp Source 03/21/17 0037 Oral     SpO2 03/21/17 0037 99 %     Weight 03/21/17 0039 95.3 kg (210 lb)     Height 03/21/17 0039 1.626 m (5\' 4" )     Head Circumference --      Peak Flow --      Pain Score 03/21/17 0037 10     Pain Loc --      Pain Edu? --      Excl. in GC? --  Constitutional: Alert and oriented. Well appearing and in no acute distress. Eyes: Conjunctivae are normal. Head: Atraumatic. Mouth/Throat: Mucous membranes are moist. Oropharynx non-erythematous. Neck: No stridor.   Cardiovascular: Normal rate, regular rhythm. Good peripheral circulation. Grossly normal heart sounds. Respiratory: Normal respiratory effort.  No retractions. Lungs CTAB. Gastrointestinal: Soft and nontender. No distention.  Musculoskeletal: Erythema dorsal aspect of the right hand at the thenar eminence extending to the wrists approximate 7 x 6 area of erythema erythematous streak extending across the forearm and up the arm to the axilla. Swelling of the right hand an area of  erythema and purulent drainage from punctate wound centrally located in the erythema of the right hand. Neurologic:  Normal speech and language. No gross focal neurologic deficits are appreciated.  Skin:  Erythema dorsal aspect of the right hand at the thenar eminence extending to the wrists approximate 7 x 6 area of erythema erythematous streak extending across the forearm and up the arm to the axilla. Swelling of the right hand an area of erythema and purulent drainage from punctate wound centrally located in the erythema of the right hand. Psychiatric: Mood and affect are normal. Speech and behavior are normal.  ____________________________________________   LABS (all labs ordered are listed, but only abnormal results are displayed)  Labs Reviewed  CBC - Abnormal; Notable for the following:       Result Value   MCV 79.9 (*)    All other components within normal limits  COMPREHENSIVE METABOLIC PANEL - Abnormal; Notable for the following:    Glucose, Bld 108 (*)    Anion gap 4 (*)    All other components within normal limits  CBC - Abnormal; Notable for the following:    MCV 78.7 (*)    All other components within normal limits  CBC - Abnormal; Notable for the following:    MCV 79.2 (*)    All other components within normal limits  BASIC METABOLIC PANEL - Abnormal; Notable for the following:    Glucose, Bld 104 (*)    Calcium 8.7 (*)    All other components within normal limits  CBC - Abnormal; Notable for the following:    MCV 79.4 (*)    All other components within normal limits  CULTURE, BLOOD (ROUTINE X 2)  CULTURE, BLOOD (ROUTINE X 2)  HIV ANTIBODY (ROUTINE TESTING)      Procedures   ____________________________________________   INITIAL IMPRESSION / ASSESSMENT AND PLAN / ED COURSE  Pertinent labs & imaging results that were available during my care of the patient were reviewed by me and considered in my medical decision making (see chart for  details).  31 year old female presenting with worsening cellulitis despite appropriate management for cat bite. As such patient given IV Zosyn. Patient discussed with Dr. Tobi BastosPyReddy for hospital admission for further evaluation and management      ____________________________________________  FINAL CLINICAL IMPRESSION(S) / ED DIAGNOSES  Final diagnoses:  Cat bite, initial encounter  Cellulitis of finger of right hand     MEDICATIONS GIVEN DURING THIS VISIT:  Medications  sertraline (ZOLOFT) tablet 100 mg (100 mg Oral Given 03/21/17 0958)  enoxaparin (LOVENOX) injection 40 mg (40 mg Subcutaneous Not Given 03/22/17 0556)  sodium chloride flush (NS) 0.9 % injection 3 mL (3 mLs Intravenous Given 03/21/17 2337)  sodium chloride flush (NS) 0.9 % injection 3 mL (not administered)  0.9 %  sodium chloride infusion (not administered)  acetaminophen (TYLENOL) tablet 650 mg (650 mg Oral Given 03/21/17 1945)  Or  acetaminophen (TYLENOL) suppository 650 mg ( Rectal See Alternative 03/21/17 1945)  HYDROcodone-acetaminophen (NORCO/VICODIN) 5-325 MG per tablet 1-2 tablet (1 tablet Oral Given 03/21/17 0904)  ondansetron (ZOFRAN) tablet 4 mg (not administered)    Or  ondansetron (ZOFRAN) injection 4 mg (not administered)  senna-docusate (Senokot-S) tablet 1 tablet (not administered)  Ampicillin-Sulbactam (UNASYN) 3 g in sodium chloride 0.9 % 100 mL IVPB (3 g Intravenous New Bag/Given 03/22/17 0556)  piperacillin-tazobactam (ZOSYN) IVPB 3.375 g (0 g Intravenous Stopping Infusion hung by another clincian 03/21/17 0700)     NEW OUTPATIENT MEDICATIONS STARTED DURING THIS VISIT:  Current Discharge Medication List      Current Discharge Medication List      Current Discharge Medication List       Note:  This document was prepared using Dragon voice recognition software and may include unintentional dictation errors.    Darci Current, MD 03/22/17 [phone]

## 2017-03-22 NOTE — Progress Notes (Signed)
Pharmacy Antibiotic Note  Jessica Espinoza is a 31 y.o. female admitted on 03/21/2017 with a cat bite.  Pharmacy has been consulted for ampicillin/sulbactam dosing.  Plan: Continue Ampicillin/sulbactam 3 g IV q6h  Height: 5\' 4"  (162.6 cm) Weight: 210 lb (95.3 kg) IBW/kg (Calculated) : 54.7  Temp (24hrs), Avg:98.2 F (36.8 C), Min:98 F (36.7 C), Max:98.3 F (36.8 C)   Recent Labs Lab 03/21/17 0053 03/21/17 0226 03/21/17 0501 03/22/17 0440  WBC 7.8 8.1 7.3 6.8  CREATININE 0.51  --  0.44  --     Estimated Creatinine Clearance: 115.1 mL/min (by C-G formula based on SCr of 0.44 mg/dL).    No Known Allergies  Antimicrobials this admission: Ampicillin/sulbactam 6/21 >>   Dose adjustments this admission:  Microbiology results: 6/21 BCx: No growth to date  Thank you for allowing pharmacy to be a part of this patient's care.  Marty HeckWang, Ola Raap L, PharmD, BCPS Clinical Pharmacist 03/22/2017 9:29 AM

## 2017-03-22 NOTE — Discharge Summary (Signed)
[facility] [place] [facility] A Bieker, 31 y.o., DOB [birth year], MRN [id number]. Admission date: 03/21/2017 Discharge Date 03/22/2017 Primary MD Patient, No Pcp Per Admitting Physician Ihor Austin, MD  Admission Diagnosis  WC-Animal Bite  Discharge Diagnosis   Active Problems:  right hand Cellulitis  obisty        Hospital Course  Dalal Patton  is a 31 y.o. female with No significant past medical history presented to the emergency room after she had a cat bite on the right hand 2 days ago.Pt admitted with cellulitis due to cat bite. She was treated with IV antibiotics with improvement in symtomts. Swelling much less.           Consults  None  Significant Tests:  See full reports for all details     No results found.     Today   Subjective:   Dariella Ragas  feeling much better hand swelling mostly resolved Objective:   Blood pressure (!) 109/56, pulse 71, temperature 98.3 F (36.8 C), temperature source Oral, resp. rate 17, height 5\' 4"  (1.626 m), weight 210 lb (95.3 kg), last menstrual period 03/07/2017, SpO2 100 %, unknown if currently breastfeeding.  .  Intake/Output Summary (Last 24 hours) at 03/22/17 1559 Last data filed at 03/22/17 1402  Gross per 24 hour  Intake              750 ml  Output                0 ml  Net              750 ml    Exam VITAL SIGNS: Blood pressure (!) 109/56, pulse 71, temperature 98.3 F (36.8 C), temperature source Oral, resp. rate 17, height 5\' 4"  (1.626 m), weight 210 lb (95.3 kg), last menstrual period 03/07/2017, SpO2 100 %, unknown if currently breastfeeding.  GENERAL:  31 y.o.-year-old patient lying in the bed with no acute distress.  EYES: Pupils equal, round, reactive to light and accommodation. No scleral icterus. Extraocular muscles intact.  HEENT: Head atraumatic, normocephalic. Oropharynx and nasopharynx clear.  NECK:  Supple, no jugular venous distention. No thyroid  enlargement, no tenderness.  LUNGS: Normal breath sounds bilaterally, no wheezing, rales,rhonchi or crepitation. No use of accessory muscles of respiration.  CARDIOVASCULAR: S1, S2 normal. No murmurs, rubs, or gallops.  ABDOMEN: Soft, nontender, nondistended. Bowel sounds present. No organomegaly or mass.  EXTREMITIES: No pedal edema, cyanosis, or clubbing.  NEUROLOGIC: Cranial nerves II through XII are intact. Muscle strength 5/5 in all extremities. Sensation intact. Gait not checked.  PSYCHIATRIC: The patient is alert and oriented x 3.  SKIN: No obvious rash, lesion, or ulcer.   Data Review     CBC w Diff: Lab Results  Component Value Date   WBC 6.8 03/22/2017   HGB 12.9 03/22/2017   HGB 12.7 09/13/2013   HCT 38.1 03/22/2017   HCT 33.6 (L) 09/14/2013   PLT 226 03/22/2017   PLT 123 (L) 09/13/2013   LYMPHOPCT 11.4 09/13/2013   MONOPCT 6.5 09/13/2013   EOSPCT 0.7 09/13/2013   BASOPCT 0.5 09/13/2013   CMP: Lab Results  Component Value Date   NA 138 03/21/2017   K 3.7 03/21/2017   CL 105 03/21/2017   CO2 27 03/21/2017   BUN 11 03/21/2017   CREATININE 0.44 03/21/2017   PROT 7.2 03/21/2017   ALBUMIN 4.0 03/21/2017   BILITOT 0.9 03/21/2017   ALKPHOS 83 03/21/2017  AST 20 03/21/2017   ALT 26 03/21/2017  .  Micro Results Recent Results (from the past 240 hour(s))  CULTURE, BLOOD (ROUTINE X 2) w Reflex to ID Panel     Status: None (Preliminary result)   Collection Time: 03/21/17  2:27 AM  Result Value Ref Range Status   Specimen Description [facility]  Final   Special Requests   Final    BOTTLES DRAWN AEROBIC AND ANAEROBIC Blood Culture adequate volume   Culture NO GROWTH 1 DAY  Final   Report Status PENDING  Incomplete  CULTURE, BLOOD (ROUTINE X 2) w Reflex to ID Panel     Status: None (Preliminary result)   Collection Time: 03/21/17  2:32 AM  Result Value Ref Range Status   Specimen Description BLOOD LT WRIST  Final   Special Requests   Final    BOTTLES DRAWN  AEROBIC AND ANAEROBIC Blood Culture adequate volume   Culture NO GROWTH 1 DAY  Final   Report Status PENDING  Incomplete        Code Status Orders        Start     Ordered   03/21/17 0453  Full code  Continuous     03/21/17 0452    Code Status History    Date Active Date Inactive Code Status Order ID Comments User Context   09/26/2015  5:57 AM 09/28/2015  4:09 PM Full Code [id number]  [place] BingPickens, Charlie, MD Inpatient   09/26/2015  2:36 AM 09/26/2015  5:57 AM Full Code [id number]  [place] BingPickens, Charlie, MD Inpatient   07/07/2015  4:54 PM 07/07/2015 11:50 PM Full Code [id number]  [place] NovakJackson, Stephen D, MD Inpatient          Follow-up Information    pcp. Schedule an appointment as soon as possible for a visit in 1 week(s).           Discharge Medications   Allergies as of 03/22/2017   No Known Allergies     Medication List    TAKE these medications   amoxicillin-clavulanate 875-125 MG tablet Commonly known as:  AUGMENTIN Take 1 tablet by mouth 2 (two) times daily.   HYDROcodone-acetaminophen 5-325 MG tablet Commonly known as:  NORCO/VICODIN Take 1-2 tablets by mouth every 4 (four) hours as needed for moderate pain. Notes to patient:  Last dose given today at 11:30am   sertraline 100 MG tablet Commonly known as:  ZOLOFT Take 100 mg by mouth daily.          Total Time in preparing paper work, data evaluation and todays exam - 35 minutes  Auburn BilberryPATEL, Camdyn Laden M.D on 03/22/2017 at 3:59 PM  [facility]   Office  [phone]

## 2017-03-26 LAB — CULTURE, BLOOD (ROUTINE X 2)
CULTURE: NO GROWTH
Culture: NO GROWTH
Special Requests: ADEQUATE
Special Requests: ADEQUATE

## 2017-07-16 DIAGNOSIS — F411 Generalized anxiety disorder: Secondary | ICD-10-CM | POA: Insufficient documentation

## 2017-07-16 DIAGNOSIS — K589 Irritable bowel syndrome without diarrhea: Secondary | ICD-10-CM | POA: Insufficient documentation

## 2018-05-31 ENCOUNTER — Encounter: Payer: Self-pay | Admitting: Emergency Medicine

## 2018-05-31 ENCOUNTER — Emergency Department
Admission: EM | Admit: 2018-05-31 | Discharge: 2018-05-31 | Disposition: A | Discharge status: Home / Self Care | Payer: Commercial Managed Care - PPO | Attending: Emergency Medicine | Admitting: Emergency Medicine

## 2018-05-31 ENCOUNTER — Other Ambulatory Visit: Payer: Self-pay

## 2018-05-31 DIAGNOSIS — Z87891 Personal history of nicotine dependence: Secondary | ICD-10-CM | POA: Diagnosis not present

## 2018-05-31 DIAGNOSIS — K0889 Other specified disorders of teeth and supporting structures: Secondary | ICD-10-CM | POA: Insufficient documentation

## 2018-05-31 DIAGNOSIS — Z79899 Other long term (current) drug therapy: Secondary | ICD-10-CM | POA: Diagnosis not present

## 2018-05-31 MED ORDER — ONDANSETRON 4 MG PO TBDP
4.0000 mg | ORAL_TABLET | Freq: Once | ORAL | Status: AC
  Administered 2018-05-31: 4 mg via ORAL

## 2018-05-31 MED ORDER — HYDROCODONE-ACETAMINOPHEN 5-325 MG PO TABS
1.0000 | ORAL_TABLET | Freq: Once | ORAL | Status: AC
  Administered 2018-05-31: 1 via ORAL
  Filled 2018-05-31: qty 1

## 2018-05-31 MED ORDER — HYDROCODONE-ACETAMINOPHEN 5-325 MG PO TABS
1.0000 | ORAL_TABLET | Freq: Once | ORAL | Status: AC
  Administered 2018-05-31: 1 via ORAL

## 2018-05-31 MED ORDER — ONDANSETRON 4 MG PO TBDP
ORAL_TABLET | ORAL | Status: AC
  Administered 2018-05-31: 4 mg
  Filled 2018-05-31: qty 1

## 2018-05-31 MED ORDER — HYDROCODONE-ACETAMINOPHEN 5-325 MG PO TABS: 1.0000 | ORAL_TABLET | Freq: Four times a day (QID) | ORAL | 0 refills | Status: AC | PRN

## 2018-05-31 NOTE — ED Provider Notes (Signed)
[facility] Emergency Department Provider Note  ____________________________________________  Time seen: Approximately 9:45 PM  I have reviewed the triage vital signs and the nursing notes.   HISTORY  Chief Complaint Dental Pain    HPI Jessica Espinoza is a 32 y.o. female presents to the emergency department with 10 out of 10 dental pain from inferior 17 extraction site.  Patient reports that she had tooth removed 4 days ago by oral surgeon.  Patient reports that since that time her pain has been increasing and she is not able to get into see her dentist until Tuesday.  Patient has been taking Tylenol and ibuprofen alternating.  She denies fever and chills.  Patient reports that pain is excruciating and is affecting her activities of daily living.   Past Medical History:  Diagnosis Date  . BMI 37.0-37.9, adult   . Obesity     Patient Active Problem List   Diagnosis Date Noted  . Cellulitis 03/21/2017  . Routine postpartum follow-up 09/28/2015  . Postpartum care and examination of lactating mother 09/28/2015  . Postpartum care following vaginal delivery 09/28/2015  . Supervision of normal pregnancy in third trimester 09/26/2015  . Nausea and vomiting during pregnancy 07/07/2015  . Obesity   . BMI 37.0-37.9, adult     Past Surgical History:  Procedure Laterality Date  . NO PAST SURGERIES      Prior to Admission medications   Medication Sig Start Date End Date Taking? Authorizing Provider  HYDROcodone-acetaminophen (NORCO) 5-325 MG tablet Take 1 tablet by mouth every 6 (six) hours as needed for up to 2 days for moderate pain. 05/31/18 06/02/18  Orvil FeilWoods, Pike Scantlebury M, PA-C  sertraline (ZOLOFT) 100 MG tablet Take 100 mg by mouth daily.    [provider]    Allergies Patient has no known allergies.  Family History  Problem Relation Age of Onset  . CAD Neg Hx   . Hypertension Neg Hx   . Diabetes Neg Hx     Social History Social History    Tobacco Use  . Smoking status: Former Smoker    Types: Cigarettes    Last attempt to quit: 05/07/2008    Years since quitting: 10.0  . Smokeless tobacco: Never Used  Substance Use Topics  . Alcohol use: No  . Drug use: No     Review of Systems  Constitutional: No fever/chills Eyes: No visual changes. No discharge ENT: Patient has Inferior 17 extraction site.  Cardiovascular: no chest pain. Respiratory: no cough. No SOB. Gastrointestinal: No abdominal pain.  No nausea, no vomiting.  No diarrhea.  No constipation. Genitourinary: Negative for dysuria. No hematuria Musculoskeletal: Negative for musculoskeletal pain. Skin: Negative for rash, abrasions, lacerations, ecchymosis. Neurological: Negative for headaches, focal weakness or numbness.  ____________________________________________   PHYSICAL EXAM:  VITAL SIGNS: ED Triage Vitals  Enc Vitals Group     BP 05/31/18 1912 (!) 169/88     Pulse Rate 05/31/18 1912 (!) 53     Resp 05/31/18 1912 20     Temp 05/31/18 1912 98.4 F (36.9 C)     Temp Source 05/31/18 1912 Oral     SpO2 05/31/18 1912 100 %     Weight 05/31/18 1913 232 lb (105.2 kg)     Height 05/31/18 1913 5\' 4"  (1.626 m)     Head Circumference --      Peak Flow --      Pain Score 05/31/18 1913 8     Pain Loc --  Pain Edu? --      Excl. in GC? --      Constitutional: Alert and oriented. Well appearing and in no acute distress. Eyes: Conjunctivae are normal. PERRL. EOMI. Head: Atraumatic. ENT:      Ears: TMs are pearly.      Nose: No congestion/rhinnorhea.      Mouth/Throat: Mucous membranes are moist.  Patient has an inferior 17 extraction site.  Does not appear to be a dry socket.  No edema of the jaw bilaterally. Neck: No stridor.  No cervical spine tenderness to palpation. Cardiovascular: Normal rate, regular rhythm. Normal S1 and S2.  Good peripheral circulation. Respiratory: Normal respiratory effort without tachypnea or retractions. Lungs CTAB.  Good air entry to the bases with no decreased or absent breath sounds. Skin:  Skin is warm, dry and intact. No rash noted.  ____________________________________________   LABS (all labs ordered are listed, but only abnormal results are displayed)  Labs Reviewed - No data to display ____________________________________________  EKG   ____________________________________________  RADIOLOGY   No results found.  ____________________________________________    PROCEDURES  Procedure(s) performed:    Procedures    Medications  HYDROcodone-acetaminophen (NORCO/VICODIN) 5-325 MG per tablet 1 tablet (1 tablet Oral Given 05/31/18 2047)  ondansetron (ZOFRAN-ODT) disintegrating tablet 4 mg (4 mg Oral Given 05/31/18 2047)     ____________________________________________   INITIAL IMPRESSION / ASSESSMENT AND PLAN / ED COURSE  Pertinent labs & imaging results that were available during my care of the patient were reviewed by me and considered in my medical decision making (see chart for details).  Review of the [place] CSRS was performed in accordance of the [facility] prior to dispensing any controlled drugs.      Assessment and plan Dental pain Patient presents to the emergency department with worsening pain from an inferior 17 extraction site.  Physical exam findings are not consistent with a dry socket.  Patient was discharged with a short course of Norco and advised to follow-up with oral surgeon on Tuesday.  All patient questions were answered.    ____________________________________________  FINAL CLINICAL IMPRESSION(S) / ED DIAGNOSES  Final diagnoses:  Pain, dental      NEW MEDICATIONS STARTED DURING THIS VISIT:  ED Discharge Orders         Ordered    HYDROcodone-acetaminophen (NORCO) 5-325 MG tablet  Every 6 hours PRN     05/31/18 2044              This chart was dictated using voice recognition software/Dragon. Despite best efforts to proofread, errors  can occur which can change the meaning. Any change was purely unintentional.    Gasper Lloyd 05/31/18 2152    Myrna Blazer, MD 05/31/18 [phone]

## 2018-05-31 NOTE — ED Triage Notes (Signed)
Lower L wisdom tooth pulled 4 days ago. Increasing pain uncontrolled with tylenol and ibuprofen.

## 2018-05-31 NOTE — ED Notes (Signed)
Pt states that she had a tooth pulled on Tuesday and today it's "pulsating" into her chin and her left eye.

## 2019-07-15 ENCOUNTER — Emergency Department (HOSPITAL_COMMUNITY): Payer: Commercial Managed Care - PPO

## 2019-07-15 ENCOUNTER — Encounter (HOSPITAL_COMMUNITY): Payer: Self-pay | Admitting: Emergency Medicine

## 2019-07-15 ENCOUNTER — Emergency Department (HOSPITAL_COMMUNITY)
Admission: EM | Admit: 2019-07-15 | Discharge: 2019-07-15 | Disposition: A | Discharge status: Home / Self Care | Payer: Commercial Managed Care - PPO | Attending: Emergency Medicine | Admitting: Emergency Medicine

## 2019-07-15 ENCOUNTER — Other Ambulatory Visit: Payer: Self-pay

## 2019-07-15 DIAGNOSIS — Z79899 Other long term (current) drug therapy: Secondary | ICD-10-CM | POA: Diagnosis not present

## 2019-07-15 DIAGNOSIS — R5383 Other fatigue: Secondary | ICD-10-CM | POA: Diagnosis not present

## 2019-07-15 DIAGNOSIS — R001 Bradycardia, unspecified: Secondary | ICD-10-CM | POA: Insufficient documentation

## 2019-07-15 DIAGNOSIS — R110 Nausea: Secondary | ICD-10-CM | POA: Insufficient documentation

## 2019-07-15 DIAGNOSIS — R197 Diarrhea, unspecified: Secondary | ICD-10-CM | POA: Insufficient documentation

## 2019-07-15 DIAGNOSIS — F419 Anxiety disorder, unspecified: Secondary | ICD-10-CM | POA: Insufficient documentation

## 2019-07-15 DIAGNOSIS — R1115 Cyclical vomiting syndrome unrelated to migraine: Secondary | ICD-10-CM

## 2019-07-15 DIAGNOSIS — R1084 Generalized abdominal pain: Secondary | ICD-10-CM | POA: Insufficient documentation

## 2019-07-15 DIAGNOSIS — R0602 Shortness of breath: Secondary | ICD-10-CM | POA: Diagnosis not present

## 2019-07-15 DIAGNOSIS — Z87891 Personal history of nicotine dependence: Secondary | ICD-10-CM | POA: Diagnosis not present

## 2019-07-15 DIAGNOSIS — R1110 Vomiting, unspecified: Secondary | ICD-10-CM | POA: Diagnosis present

## 2019-07-15 LAB — COMPREHENSIVE METABOLIC PANEL
ALT: 28 U/L (ref 0–44)
AST: 27 U/L (ref 15–41)
Albumin: 4.6 g/dL (ref 3.5–5.0)
Alkaline Phosphatase: 75 U/L (ref 38–126)
Anion gap: 19 — ABNORMAL HIGH (ref 5–15)
BUN: <5 mg/dL — ABNORMAL LOW (ref 6–20)
CO2: 16 mmol/L — ABNORMAL LOW (ref 22–32)
Calcium: 9.3 mg/dL (ref 8.9–10.3)
Chloride: 102 mmol/L (ref 98–111)
Creatinine, Ser: 0.73 mg/dL (ref 0.44–1.00)
GFR calc Af Amer: >60 mL/min (ref >60)
GFR calc non Af Amer: >60 mL/min (ref >60)
Glucose, Bld: 169 mg/dL — ABNORMAL HIGH (ref 70–99)
Potassium: 3.5 mmol/L (ref 3.5–5.1)
Sodium: 137 mmol/L (ref 135–145)
Total Bilirubin: 1 mg/dL (ref 0.3–1.2)
Total Protein: 7.5 g/dL (ref 6.5–8.1)

## 2019-07-15 LAB — URINALYSIS, ROUTINE W REFLEX MICROSCOPIC
Bilirubin Urine: NEGATIVE
Glucose, UA: NEGATIVE mg/dL
Ketones, ur: 80 mg/dL — AB
Leukocytes,Ua: NEGATIVE
Nitrite: NEGATIVE
Protein, ur: NEGATIVE mg/dL
Specific Gravity, Urine: 1.012 (ref 1.005–1.030)
pH: 8 (ref 5.0–8.0)

## 2019-07-15 LAB — BASIC METABOLIC PANEL
Anion gap: 10 (ref 5–15)
BUN: <5 mg/dL — ABNORMAL LOW (ref 6–20)
CO2: 22 mmol/L (ref 22–32)
Calcium: 8.4 mg/dL — ABNORMAL LOW (ref 8.9–10.3)
Chloride: 106 mmol/L (ref 98–111)
Creatinine, Ser: 0.56 mg/dL (ref 0.44–1.00)
GFR calc Af Amer: >60 mL/min (ref >60)
GFR calc non Af Amer: >60 mL/min (ref >60)
Glucose, Bld: 99 mg/dL (ref 70–99)
Potassium: 3.4 mmol/L — ABNORMAL LOW (ref 3.5–5.1)
Sodium: 138 mmol/L (ref 135–145)

## 2019-07-15 LAB — POCT I-STAT EG7
Acid-base deficit: 3 mmol/L — ABNORMAL HIGH (ref 0.0–2.0)
Bicarbonate: 23.4 mmol/L (ref 20.0–28.0)
Calcium, Ion: 1.15 mmol/L (ref 1.15–1.40)
HCT: 39 % (ref 36.0–46.0)
Hemoglobin: 13.3 g/dL (ref 12.0–15.0)
O2 Saturation: 72 %
Potassium: 3.4 mmol/L — ABNORMAL LOW (ref 3.5–5.1)
Sodium: 140 mmol/L (ref 135–145)
TCO2: 25 mmol/L (ref 22–32)
pCO2, Ven: 45 mmHg (ref 44.0–60.0)
pH, Ven: 7.324 (ref 7.250–7.430)
pO2, Ven: 41 mmHg (ref 32.0–45.0)

## 2019-07-15 LAB — ACETAMINOPHEN LEVEL: Acetaminophen (Tylenol), Serum: <10 ug/mL — ABNORMAL LOW (ref 10–30)

## 2019-07-15 LAB — CBC WITH DIFFERENTIAL/PLATELET
Abs Immature Granulocytes: 0.04 10*3/uL (ref 0.00–0.07)
Basophils Absolute: 0.1 10*3/uL (ref 0.0–0.1)
Basophils Relative: 1 %
Eosinophils Absolute: 0 10*3/uL (ref 0.0–0.5)
Eosinophils Relative: 0 %
HCT: 45.3 % (ref 36.0–46.0)
Hemoglobin: 14.8 g/dL (ref 12.0–15.0)
Immature Granulocytes: 0 %
Lymphocytes Relative: 9 %
Lymphs Abs: 1 10*3/uL (ref 0.7–4.0)
MCH: 27.2 pg (ref 26.0–34.0)
MCHC: 32.7 g/dL (ref 30.0–36.0)
MCV: 83.3 fL (ref 80.0–100.0)
Monocytes Absolute: 0.5 10*3/uL (ref 0.1–1.0)
Monocytes Relative: 4 %
Neutro Abs: 10.3 10*3/uL — ABNORMAL HIGH (ref 1.7–7.7)
Neutrophils Relative %: 86 %
Platelets: UNDETERMINED 10*3/uL (ref 150–400)
RBC: 5.44 MIL/uL — ABNORMAL HIGH (ref 3.87–5.11)
RDW: 12.4 % (ref 11.5–15.5)
WBC: 12 10*3/uL — ABNORMAL HIGH (ref 4.0–10.5)
nRBC: 0 % (ref 0.0–0.2)

## 2019-07-15 LAB — RPR: RPR Ser Ql: NONREACTIVE

## 2019-07-15 LAB — RAPID URINE DRUG SCREEN, HOSP PERFORMED
Amphetamines: NOT DETECTED
Barbiturates: NOT DETECTED
Benzodiazepines: NOT DETECTED
Cocaine: NOT DETECTED
Opiates: NOT DETECTED
Tetrahydrocannabinol: POSITIVE — AB

## 2019-07-15 LAB — LIPASE, BLOOD: Lipase: 23 U/L (ref 11–51)

## 2019-07-15 LAB — SALICYLATE LEVEL: Salicylate Lvl: <7 mg/dL (ref 2.8–30.0)

## 2019-07-15 LAB — CK: Total CK: 319 U/L — ABNORMAL HIGH (ref 38–234)

## 2019-07-15 LAB — I-STAT BETA HCG BLOOD, ED (MC, WL, AP ONLY): I-stat hCG, quantitative: <5 m[IU]/mL (ref <5)

## 2019-07-15 LAB — HIV ANTIBODY (ROUTINE TESTING W REFLEX): HIV Screen 4th Generation wRfx: NONREACTIVE

## 2019-07-15 MED ORDER — PROMETHAZINE HCL 25 MG/ML IJ SOLN
12.5000 mg | Freq: Once | INTRAMUSCULAR | Status: AC
  Administered 2019-07-15: 12.5 mg via INTRAVENOUS
  Filled 2019-07-15: qty 1

## 2019-07-15 MED ORDER — SODIUM CHLORIDE 0.9 % IV BOLUS
1000.0000 mL | Freq: Once | INTRAVENOUS | Status: AC
  Administered 2019-07-15: 1000 mL via INTRAVENOUS

## 2019-07-15 MED ORDER — IOHEXOL 350 MG/ML SOLN
100.0000 mL | Freq: Once | INTRAVENOUS | Status: AC | PRN
  Administered 2019-07-15: 100 mL via INTRAVENOUS

## 2019-07-15 MED ORDER — CAPSAICIN 0.025 % EX CREA
TOPICAL_CREAM | Freq: Two times a day (BID) | CUTANEOUS | Status: DC
  Filled 2019-07-15 (×2): qty 60

## 2019-07-15 MED ORDER — HALOPERIDOL LACTATE 5 MG/ML IJ SOLN
4.0000 mg | Freq: Once | INTRAMUSCULAR | Status: AC
  Administered 2019-07-15: 10:00:00 4 mg via INTRAVENOUS
  Filled 2019-07-15: qty 1

## 2019-07-15 NOTE — ED Triage Notes (Signed)
Per EMS- last night at 0200 had acute onset nausea vomiting. Upon EMS arrival pt was hypotensive and bradycardic. Pt reports bilateral lower abdominal pain, hx of IBS. This has been intermittently going on for 1 month.

## 2019-07-15 NOTE — Discharge Instructions (Addendum)
Please stop any marijuana use as this may make your vomiting return Continue Zofran as needed for nausea  Please follow up with your doctor Return if worsening

## 2019-07-15 NOTE — ED Notes (Signed)
Pt stated she was feeling much better. Pt verbalized understanding of discharge instructions. Pt brought to waiting room until her ride arrives.

## 2019-07-15 NOTE — ED Provider Notes (Addendum)
[facility] EMERGENCY DEPARTMENT Provider Note   CSN: [id number] Arrival date & time: 07/15/19  0757     History   Chief Complaint Chief Complaint  Patient presents with   Hypotension    HPI Jessica Espinoza is a 33 y.o. female who presents with N/V. PMH significant for obesity, IBS, anxiety. She states that she has been having multiple episodes of N/V for approximately one month. She doesn't vomit every day but will vomit every other day multiple times in a row. She did have a tele-health visit with her doctor on 9/28 and was prescribed allergy medicine and Zofran. She has been taking this with some relief. Early this morning she had multiple episodes of emesis and couldn't stop so EMS was called. They noted she was hypotensive in the 70s and bradycardic and gave her 800cc fluid and Zofran. The patient reports ongoing nausea. She also has some sharp lower abdominal pain which she's had with her IBS before but also states it's "different". She denies fever, chills, chest pain. She did have some SOB earlier this morning which comes and goes. She has ongoing nausea with dry heaves. She also has diarrhea which is normal for her with her IBS. She denies any urinary symptoms or vaginal discharge or bleeding. She endorses marijuana use which she states helps her nausea     HPI  Past Medical History:  Diagnosis Date   BMI 37.0-37.9, adult    Obesity     Patient Active Problem List   Diagnosis Date Noted   Cellulitis 03/21/2017   Routine postpartum follow-up 09/28/2015   Postpartum care and examination of lactating mother 09/28/2015   Postpartum care following vaginal delivery 09/28/2015   Supervision of normal pregnancy in third trimester 09/26/2015   Nausea and vomiting during pregnancy 07/07/2015   Obesity    BMI 37.0-37.9, adult     Past Surgical History:  Procedure Laterality Date   NO PAST SURGERIES       OB History    Gravida  3   Para    3   Term  3   Preterm      AB      Living  3     SAB      TAB      Ectopic      Multiple  0   Live Births  3            Home Medications    Prior to Admission medications   Medication Sig Start Date End Date Taking? Authorizing Provider  sertraline (ZOLOFT) 100 MG tablet Take 100 mg by mouth daily.    [provider]    Family History Family History  Problem Relation Age of Onset   CAD Neg Hx    Hypertension Neg Hx    Diabetes Neg Hx     Social History Social History   Tobacco Use   Smoking status: Former Smoker    Types: Cigarettes    Quit date: 05/07/2008    Years since quitting: 11.1   Smokeless tobacco: Never Used  Substance Use Topics   Alcohol use: No   Drug use: No     Allergies   Patient has no known allergies.   Review of Systems Review of Systems  Constitutional: Positive for fatigue. Negative for chills and fever.  HENT: Positive for postnasal drip.   Respiratory: Positive for shortness of breath.   Cardiovascular: Negative for chest pain.  Gastrointestinal: Positive for  diarrhea, nausea and vomiting. Negative for abdominal pain.  Genitourinary: Negative for dysuria and flank pain.  All other systems reviewed and are negative.    Physical Exam Updated Vital Signs BP (!) 164/78    Pulse (!) 56    Temp 98 F (36.7 C) (Oral)    Resp 16    LMP 07/14/2019    SpO2 100%   Physical Exam Vitals signs and nursing note reviewed.  Constitutional:      General: She is not in acute distress.    Appearance: She is well-developed. She is obese. She is not ill-appearing.     Comments: Anxious and fatigued appearing female in mild distress  HENT:     Head: Normocephalic and atraumatic.  Eyes:     General: No scleral icterus.       Right eye: No discharge.        Left eye: No discharge.     Conjunctiva/sclera: Conjunctivae normal.     Pupils: Pupils are equal, round, and reactive to light.  Neck:     Musculoskeletal:  Normal range of motion.  Cardiovascular:     Rate and Rhythm: Bradycardia present.  Pulmonary:     Effort: Pulmonary effort is normal. No respiratory distress.     Breath sounds: Normal breath sounds.  Abdominal:     General: There is no distension.     Palpations: Abdomen is soft.     Tenderness: There is abdominal tenderness (generalized).  Genitourinary:    Comments: Pelvic: Refused Skin:    General: Skin is warm and dry.  Neurological:     Mental Status: She is alert and oriented to person, place, and time.  Psychiatric:        Behavior: Behavior normal.      ED Treatments / Results  Labs (all labs ordered are listed, but only abnormal results are displayed) Labs Reviewed  CBC WITH DIFFERENTIAL/PLATELET - Abnormal; Notable for the following components:      Result Value   WBC 12.0 (*)    RBC 5.44 (*)    Neutro Abs 10.3 (*)    All other components within normal limits  COMPREHENSIVE METABOLIC PANEL - Abnormal; Notable for the following components:   CO2 16 (*)    Glucose, Bld 169 (*)    BUN <5 (*)    Anion gap 19 (*)    All other components within normal limits  URINALYSIS, ROUTINE W REFLEX MICROSCOPIC - Abnormal; Notable for the following components:   Color, Urine STRAW (*)    Hgb urine dipstick LARGE (*)    Ketones, ur 80 (*)    Bacteria, UA RARE (*)    All other components within normal limits  RAPID URINE DRUG SCREEN, HOSP PERFORMED - Abnormal; Notable for the following components:   Tetrahydrocannabinol POSITIVE (*)    All other components within normal limits  ACETAMINOPHEN LEVEL - Abnormal; Notable for the following components:   Acetaminophen (Tylenol), Serum <10 (*)    All other components within normal limits  CK - Abnormal; Notable for the following components:   Total CK 319 (*)    All other components within normal limits  BASIC METABOLIC PANEL - Abnormal; Notable for the following components:   Potassium 3.4 (*)    BUN <5 (*)    Calcium 8.4  (*)    All other components within normal limits  POCT I-STAT EG7 - Abnormal; Notable for the following components:   Acid-base deficit 3.0 (*)  Potassium 3.4 (*)    All other components within normal limits  LIPASE, BLOOD  RPR  HIV ANTIBODY (ROUTINE TESTING W REFLEX)  SALICYLATE LEVEL  [id number] SAVE TUBE  I-STAT BETA HCG BLOOD, ED ([facility], [facility], [facility] ONLY)  I-STAT VENOUS BLOOD GAS, ED    EKG EKG Interpretation  Date/Time:  Wednesday July 15 2019 08:03:53 EDT Ventricular Rate:  51 PR Interval:    QRS Duration: 104 QT Interval:  483 QTC Calculation: 445 R Axis:   65 Text Interpretation:  Sinus rhythm No STEMI  Confirmed by Nanda Quinton [phone]) on 07/15/2019 8:06:19 AM   Radiology Ct Abdomen Pelvis W Contrast  Result Date: 07/15/2019 CLINICAL DATA:  Nausea, vomiting, lower abdominal pain EXAM: CT ABDOMEN AND PELVIS WITH CONTRAST TECHNIQUE: Multidetector CT imaging of the abdomen and pelvis was performed using the standard protocol following bolus administration of intravenous contrast. CONTRAST:  125mL OMNIPAQUE IOHEXOL 350 MG/ML SOLN COMPARISON:  01/25/2011 FINDINGS: Lower chest: Lung bases are clear. No effusions. Heart is normal size. Hepatobiliary: Diffuse low-density throughout the liver compatible with fatty infiltration. No focal abnormality. Gallbladder unremarkable. Pancreas: No focal abnormality or ductal dilatation. Spleen: No focal abnormality.  Normal size. Adrenals/Urinary Tract: No adrenal abnormality. No focal renal abnormality. No stones or hydronephrosis. Urinary bladder is unremarkable. Stomach/Bowel: Normal appendix. Stomach, large and small bowel grossly unremarkable. Vascular/Lymphatic: No evidence of aneurysm or adenopathy. Reproductive: 4.1 cm left ovarian cyst. Uterus and right ovary unremarkable. Other: No free fluid or free air. Musculoskeletal: No acute bony abnormality. IMPRESSION: Diffuse fatty infiltration of the liver. 4.1 cm left ovarian cyst. Otherwise  unremarkable CT of the abdomen and pelvis. Electronically Signed   By: Rolm Baptise M.D.   On: 07/15/2019 10:47    Procedures Procedures (including critical care time)  Medications Ordered in ED Medications  capsaicin (ZOSTRIX) 0.025 % cream ( Topical Not Given 07/15/19 1210)  sodium chloride 0.9 % bolus 1,000 mL (0 mLs Intravenous Stopped 07/15/19 1253)  promethazine (PHENERGAN) injection 12.5 mg (12.5 mg Intravenous Given 07/15/19 0830)  sodium chloride 0.9 % bolus 1,000 mL (0 mLs Intravenous Stopped 07/15/19 1253)  haloperidol lactate (HALDOL) injection 4 mg (4 mg Intravenous Given 07/15/19 0935)  iohexol (OMNIPAQUE) 350 MG/ML injection 100 mL (100 mLs Intravenous Contrast Given 07/15/19 1029)     Initial Impression / Assessment and Plan / ED Course  I have reviewed the triage vital signs and the nursing notes.  Pertinent labs & imaging results that were available during my care of the patient were reviewed by me and considered in my medical decision making (see chart for details).  33 year old female presents with intractable N/V for about a month with associated abdominal pain. She is bradycardic on arrival and BP is elevated. She is somewhat distressed and it's difficult to get a good history from her. Will obtain labs and start fluids and Phenergan as she has already had Zofran. Will obtain CT abdomen/pelvis. I recommended a pelvic exam but she is refusing at this time as she is too uncomfortable.   CBC shows mild leukocytosis of 12. CMP is remarkable for bicarb of 16, mild hyperglycemia (169), and elevated anion gap (19). She does not have hx of diabetes. UA shows 80 ketones but is not consistent with UTI. There is RBC but she is on her period. ASA and Tylenol were obtained and are negative. CK is minimally elevated to 319.  Rechecked pt. She is still vomiting and uncomfortable. UDS is positive for THC. Will try  Haldol. Suspect Cannabinoid Hyperemesis.   CT is negative.  11:31  AM Rechecked pt. She states that she feels a lot better after Haldol like her "brain is functioning again". She is requesting gingerale and would like to go home. She is still refusing pelvic stating she is on her period and would prefer to f/u with her doctor. She has had 2L of fluid. Will recheck BMP to ensure improvement and VBG. Will PO challenge.   Pt tolerated PO. She is ambulating in the hall. She was advised to stop THC use. Repeat BMP is improved. On VBG her pH is 7.3. She verbalized understanding.   Final Clinical Impressions(s) / ED Diagnoses   Final diagnoses:  Cyclical vomiting    ED Discharge Orders    None       Bethel BornGekas, Loni Abdon Marie, PA-C 07/15/19 1259    Bethel BornGekas, Dorianna Mckiver Marie, PA-C 07/15/19 1259    Long, Arlyss RepressJoshua G, MD 07/15/19 1844

## 2019-07-15 NOTE — ED Notes (Signed)
Pt ambulated independently to the bathroom 

## 2019-07-17 ENCOUNTER — Emergency Department: Payer: Commercial Managed Care - PPO

## 2019-07-17 ENCOUNTER — Encounter: Payer: Self-pay | Admitting: Radiology

## 2019-07-17 ENCOUNTER — Other Ambulatory Visit: Payer: Self-pay

## 2019-07-17 ENCOUNTER — Observation Stay
Admission: EM | Admit: 2019-07-17 | Discharge: 2019-07-18 | Disposition: A | Discharge status: Home / Self Care | Payer: Commercial Managed Care - PPO | Attending: Specialist | Admitting: Specialist

## 2019-07-17 DIAGNOSIS — E871 Hypo-osmolality and hyponatremia: Secondary | ICD-10-CM | POA: Diagnosis not present

## 2019-07-17 DIAGNOSIS — I10 Essential (primary) hypertension: Secondary | ICD-10-CM | POA: Diagnosis not present

## 2019-07-17 DIAGNOSIS — K760 Fatty (change of) liver, not elsewhere classified: Secondary | ICD-10-CM | POA: Diagnosis not present

## 2019-07-17 DIAGNOSIS — Z20828 Contact with and (suspected) exposure to other viral communicable diseases: Secondary | ICD-10-CM | POA: Diagnosis not present

## 2019-07-17 DIAGNOSIS — E669 Obesity, unspecified: Secondary | ICD-10-CM | POA: Insufficient documentation

## 2019-07-17 DIAGNOSIS — R739 Hyperglycemia, unspecified: Secondary | ICD-10-CM | POA: Insufficient documentation

## 2019-07-17 DIAGNOSIS — Z79899 Other long term (current) drug therapy: Secondary | ICD-10-CM | POA: Insufficient documentation

## 2019-07-17 DIAGNOSIS — E860 Dehydration: Secondary | ICD-10-CM | POA: Insufficient documentation

## 2019-07-17 DIAGNOSIS — Z87891 Personal history of nicotine dependence: Secondary | ICD-10-CM | POA: Insufficient documentation

## 2019-07-17 DIAGNOSIS — R9431 Abnormal electrocardiogram [ECG] [EKG]: Secondary | ICD-10-CM | POA: Insufficient documentation

## 2019-07-17 DIAGNOSIS — F1210 Cannabis abuse, uncomplicated: Secondary | ICD-10-CM | POA: Insufficient documentation

## 2019-07-17 DIAGNOSIS — E876 Hypokalemia: Secondary | ICD-10-CM | POA: Diagnosis present

## 2019-07-17 DIAGNOSIS — R112 Nausea with vomiting, unspecified: Secondary | ICD-10-CM | POA: Diagnosis present

## 2019-07-17 DIAGNOSIS — Z6837 Body mass index (BMI) 37.0-37.9, adult: Secondary | ICD-10-CM | POA: Diagnosis not present

## 2019-07-17 DIAGNOSIS — F329 Major depressive disorder, single episode, unspecified: Secondary | ICD-10-CM | POA: Insufficient documentation

## 2019-07-17 LAB — CBC
HCT: 40.5 % (ref 36.0–46.0)
Hemoglobin: 14 g/dL (ref 12.0–15.0)
MCH: 26.6 pg (ref 26.0–34.0)
MCHC: 34.6 g/dL (ref 30.0–36.0)
MCV: 77 fL — ABNORMAL LOW (ref 80.0–100.0)
Platelets: 363 10*3/uL (ref 150–400)
RBC: 5.26 MIL/uL — ABNORMAL HIGH (ref 3.87–5.11)
RDW: 12.7 % (ref 11.5–15.5)
WBC: 18.2 10*3/uL — ABNORMAL HIGH (ref 4.0–10.5)
nRBC: 0 % (ref 0.0–0.2)

## 2019-07-17 LAB — COMPREHENSIVE METABOLIC PANEL
ALT: 27 U/L (ref 0–44)
AST: 38 U/L (ref 15–41)
Albumin: 5.2 g/dL — ABNORMAL HIGH (ref 3.5–5.0)
Alkaline Phosphatase: 73 U/L (ref 38–126)
Anion gap: 17 — ABNORMAL HIGH (ref 5–15)
BUN: 8 mg/dL (ref 6–20)
CO2: 21 mmol/L — ABNORMAL LOW (ref 22–32)
Calcium: 9.8 mg/dL (ref 8.9–10.3)
Chloride: 94 mmol/L — ABNORMAL LOW (ref 98–111)
Creatinine, Ser: 0.47 mg/dL (ref 0.44–1.00)
GFR calc Af Amer: >60 mL/min (ref >60)
GFR calc non Af Amer: >60 mL/min (ref >60)
Glucose, Bld: 124 mg/dL — ABNORMAL HIGH (ref 70–99)
Potassium: 2.5 mmol/L — CL (ref 3.5–5.1)
Sodium: 132 mmol/L — ABNORMAL LOW (ref 135–145)
Total Bilirubin: 1.9 mg/dL — ABNORMAL HIGH (ref 0.3–1.2)
Total Protein: 8.6 g/dL — ABNORMAL HIGH (ref 6.5–8.1)

## 2019-07-17 LAB — URINALYSIS, COMPLETE (UACMP) WITH MICROSCOPIC
Bacteria, UA: NONE SEEN
Bilirubin Urine: NEGATIVE
Glucose, UA: NEGATIVE mg/dL
Ketones, ur: 20 mg/dL — AB
Leukocytes,Ua: NEGATIVE
Nitrite: NEGATIVE
Protein, ur: NEGATIVE mg/dL
Specific Gravity, Urine: 1.018 (ref 1.005–1.030)
pH: 6 (ref 5.0–8.0)

## 2019-07-17 LAB — URINE DRUG SCREEN, QUALITATIVE (ARMC ONLY)
Amphetamines, Ur Screen: NOT DETECTED
Barbiturates, Ur Screen: NOT DETECTED
Benzodiazepine, Ur Scrn: NOT DETECTED
Cannabinoid 50 Ng, Ur ~~LOC~~: POSITIVE — AB
Cocaine Metabolite,Ur ~~LOC~~: NOT DETECTED
MDMA (Ecstasy)Ur Screen: NOT DETECTED
Methadone Scn, Ur: NOT DETECTED
Opiate, Ur Screen: POSITIVE — AB
Phencyclidine (PCP) Ur S: NOT DETECTED
Tricyclic, Ur Screen: NOT DETECTED

## 2019-07-17 LAB — HCG, QUANTITATIVE, PREGNANCY: hCG, Beta Chain, Quant, S: <1 m[IU]/mL (ref <5)

## 2019-07-17 LAB — POTASSIUM: Potassium: 2.8 mmol/L — ABNORMAL LOW (ref 3.5–5.1)

## 2019-07-17 LAB — LIPASE, BLOOD: Lipase: 17 U/L (ref 11–51)

## 2019-07-17 LAB — MAGNESIUM: Magnesium: 2 mg/dL (ref 1.7–2.4)

## 2019-07-17 MED ORDER — HALOPERIDOL LACTATE 5 MG/ML IJ SOLN
5.0000 mg | Freq: Once | INTRAMUSCULAR | Status: AC
  Administered 2019-07-17: 5 mg via INTRAVENOUS
  Filled 2019-07-17: qty 1

## 2019-07-17 MED ORDER — MORPHINE SULFATE (PF) 2 MG/ML IV SOLN
2.0000 mg | INTRAVENOUS | Status: DC | PRN
  Administered 2019-07-17 – 2019-07-18 (×3): 2 mg via INTRAVENOUS
  Filled 2019-07-17 (×3): qty 1

## 2019-07-17 MED ORDER — TRAZODONE HCL 50 MG PO TABS
50.0000 mg | ORAL_TABLET | Freq: Every evening | ORAL | Status: DC | PRN
  Administered 2019-07-18: 50 mg via ORAL
  Filled 2019-07-17: qty 1

## 2019-07-17 MED ORDER — IOHEXOL 9 MG/ML PO SOLN
500.0000 mL | Freq: Two times a day (BID) | ORAL | Status: DC | PRN
  Administered 2019-07-17: 500 mL via ORAL
  Filled 2019-07-17: qty 500

## 2019-07-17 MED ORDER — PANTOPRAZOLE SODIUM 40 MG IV SOLR: 40.0000 mg | Freq: Two times a day (BID) | INTRAVENOUS | Status: DC

## 2019-07-17 MED ORDER — LABETALOL HCL 5 MG/ML IV SOLN: 5.0000 mg | Freq: Four times a day (QID) | INTRAVENOUS | Status: DC | PRN

## 2019-07-17 MED ORDER — POTASSIUM CHLORIDE 10 MEQ/100ML IV SOLN
10.0000 meq | Freq: Once | INTRAVENOUS | Status: AC
  Administered 2019-07-17: 10 meq via INTRAVENOUS
  Filled 2019-07-17: qty 100

## 2019-07-17 MED ORDER — SODIUM CHLORIDE 0.9 % IV SOLN
INTRAVENOUS | Status: DC
  Administered 2019-07-17 (×2): via INTRAVENOUS

## 2019-07-17 MED ORDER — ACETAMINOPHEN 325 MG PO TABS: 650.0000 mg | ORAL_TABLET | Freq: Four times a day (QID) | ORAL | Status: DC | PRN

## 2019-07-17 MED ORDER — SODIUM CHLORIDE 0.9 % IV BOLUS
1000.0000 mL | Freq: Once | INTRAVENOUS | Status: AC
  Administered 2019-07-17: 1000 mL via INTRAVENOUS

## 2019-07-17 MED ORDER — POTASSIUM CHLORIDE CRYS ER 20 MEQ PO TBCR
40.0000 meq | EXTENDED_RELEASE_TABLET | Freq: Once | ORAL | Status: DC
  Filled 2019-07-17: qty 2

## 2019-07-17 MED ORDER — POTASSIUM CHLORIDE 20 MEQ/15ML (10%) PO SOLN
40.0000 meq | Freq: Once | ORAL | Status: AC
  Filled 2019-07-17: qty 30

## 2019-07-17 MED ORDER — MORPHINE SULFATE (PF) 4 MG/ML IV SOLN
4.0000 mg | Freq: Once | INTRAVENOUS | Status: AC
  Administered 2019-07-17: 4 mg via INTRAVENOUS
  Filled 2019-07-17: qty 1

## 2019-07-17 MED ORDER — HALOPERIDOL LACTATE 5 MG/ML IJ SOLN
2.0000 mg | Freq: Four times a day (QID) | INTRAMUSCULAR | Status: DC | PRN
  Administered 2019-07-17 – 2019-07-18 (×3): 2 mg via INTRAVENOUS
  Filled 2019-07-17 (×3): qty 1

## 2019-07-17 MED ORDER — PANTOPRAZOLE SODIUM 40 MG IV SOLR
40.0000 mg | Freq: Two times a day (BID) | INTRAVENOUS | Status: DC
  Administered 2019-07-17 – 2019-07-18 (×2): 40 mg via INTRAVENOUS
  Filled 2019-07-17 (×3): qty 40

## 2019-07-17 MED ORDER — POLYETHYLENE GLYCOL 3350 17 G PO PACK: 17.0000 g | PACK | Freq: Every day | ORAL | Status: DC | PRN

## 2019-07-17 MED ORDER — ACETAMINOPHEN 650 MG RE SUPP: 650.0000 mg | Freq: Four times a day (QID) | RECTAL | Status: DC | PRN

## 2019-07-17 MED ORDER — IOHEXOL 300 MG/ML  SOLN
125.0000 mL | Freq: Once | INTRAMUSCULAR | Status: AC | PRN
  Administered 2019-07-17: 125 mL via INTRAVENOUS

## 2019-07-17 MED ORDER — ENOXAPARIN SODIUM 40 MG/0.4ML ~~LOC~~ SOLN
40.0000 mg | SUBCUTANEOUS | Status: DC
  Administered 2019-07-17: 40 mg via SUBCUTANEOUS
  Filled 2019-07-17: qty 0.4

## 2019-07-17 MED ORDER — SERTRALINE HCL 50 MG PO TABS
100.0000 mg | ORAL_TABLET | Freq: Every day | ORAL | Status: DC
  Administered 2019-07-17 – 2019-07-18 (×2): 100 mg via ORAL
  Filled 2019-07-17 (×2): qty 2

## 2019-07-17 MED ORDER — ONDANSETRON 4 MG PO TBDP
4.0000 mg | ORAL_TABLET | Freq: Once | ORAL | Status: AC | PRN
  Administered 2019-07-17: 4 mg via ORAL
  Filled 2019-07-17: qty 1

## 2019-07-17 NOTE — ED Provider Notes (Signed)
[facility] Emergency Department Provider Note   ____________________________________________   First MD Initiated Contact with Patient 07/17/19 1407     (approximate)  I have reviewed the triage vital signs and the nursing notes.   HISTORY  Chief Complaint Emesis    HPI Jessica Espinoza is a 33 y.o. female who reports she has had hyperemesis from marijuana smoking.  She says she is going to stop.  She reports she has been vomiting a lot and has diffuse pain in her belly up through the middle of her chest and her throat is sore.  She is hoarse since the vomiting.  Pain is achy and burning and moderately severe.  She has a white count of 18,000.         Past Medical History:  Diagnosis Date  . BMI 37.0-37.9, adult   . Obesity     Patient Active Problem List   Diagnosis Date Noted  . Cellulitis 03/21/2017  . Routine postpartum follow-up 09/28/2015  . Postpartum care and examination of lactating mother 09/28/2015  . Postpartum care following vaginal delivery 09/28/2015  . Supervision of normal pregnancy in third trimester 09/26/2015  . Nausea and vomiting during pregnancy 07/07/2015  . Obesity   . BMI 37.0-37.9, adult     Past Surgical History:  Procedure Laterality Date  . NO PAST SURGERIES      Prior to Admission medications   Medication Sig Start Date End Date Taking? Authorizing Provider  sertraline (ZOLOFT) 100 MG tablet Take 100 mg by mouth daily.    [provider]    Allergies Patient has no known allergies.  Family History  Problem Relation Age of Onset  . CAD Neg Hx   . Hypertension Neg Hx   . Diabetes Neg Hx     Social History Social History   Tobacco Use  . Smoking status: Former Smoker    Types: Cigarettes    Quit date: 05/07/2008    Years since quitting: 11.2  . Smokeless tobacco: Never Used  Substance Use Topics  . Alcohol use: No  . Drug use: No    Review of Systems  Constitutional: No  fever/chills Eyes: No visual changes. ENT: No sore throat. Cardiovascular:  chest pain. Respiratory: Denies shortness of breath. Gastrointestinal:  abdominal pain.   nausea,  vomiting.  No diarrhea.  No constipation. Genitourinary: Negative for dysuria. Musculoskeletal: Negative for back pain. Skin: Negative for rash. Neurological: Negative for headaches, focal weakness   Allergic/Immunilogical: **}  ____________________________________________   PHYSICAL EXAM:  VITAL SIGNS: ED Triage Vitals  Enc Vitals Group     BP 07/17/19 1210 (!) 167/80     Pulse Rate 07/17/19 1210 86     Resp 07/17/19 1210 20     Temp 07/17/19 1210 98.2 F (36.8 C)     Temp Source 07/17/19 1210 Oral     SpO2 07/17/19 1210 99 %     Weight 07/17/19 1215 231 lb 7.7 oz (105 kg)     Height 07/17/19 1215 5\' 5"  (1.651 m)     Head Circumference --      Peak Flow --      Pain Score 07/17/19 1214 10     Pain Loc --      Pain Edu? --      Excl. in GC? --     Constitutional: Alert and oriented. Ill appearing in some pain Eyes: Conjunctivae are normal.  Head: Atraumatic. Nose: No congestion/rhinnorhea. Mouth/Throat: Mucous membranes are  moist.  Oropharynx non-erythematous. Neck: No stridor.  Cardiovascular: Normal rate, regular rhythm. Grossly normal heart sounds.  Good peripheral circulation. Respiratory: Normal respiratory effort.  No retractions. Lungs CTAB. Gastrointestinal: Soft diffusely tender to palpation no distention. No abdominal bruits. No CVA tenderness. Musculoskeletal: No lower extremity tenderness nor edema.  No joint effusions. Neurologic:  Normal speech and language. No gross focal neurologic deficits are appreciated. No gait instability. Skin:  Skin is warm, dry and intact. No rash noted.   ____________________________________________   LABS (all labs ordered are listed, but only abnormal results are displayed)  Labs Reviewed  COMPREHENSIVE METABOLIC PANEL - Abnormal; Notable for  the following components:      Result Value   Sodium 132 (*)    Potassium 2.5 (*)    Chloride 94 (*)    CO2 21 (*)    Glucose, Bld 124 (*)    Total Protein 8.6 (*)    Albumin 5.2 (*)    Total Bilirubin 1.9 (*)    Anion gap 17 (*)    All other components within normal limits  CBC - Abnormal; Notable for the following components:   WBC 18.2 (*)    RBC 5.26 (*)    MCV 77.0 (*)    All other components within normal limits  LIPASE, BLOOD  MAGNESIUM  HCG, QUANTITATIVE, PREGNANCY  URINALYSIS, COMPLETE (UACMP) WITH MICROSCOPIC  URINE DRUG SCREEN, QUALITATIVE ([facility] ONLY)  POC URINE PREG, ED   ____________________________________________  EKG  EKG read interpreted by me shows sinus bradycardia rate of 57 normal axis prolonged QT interval QTC is read read at 618 ms this is new from 2 days ago.  We will give her some potassium. ____________________________________________  RADIOLOGY  ED MD interpretatio Official radiology report(s): Dg Chest Portable 1 View  Result Date: 07/17/2019 CLINICAL DATA:  Chest pain EXAM: PORTABLE CHEST 1 VIEW COMPARISON:  None FINDINGS: Lungs are clear. Heart size and pulmonary vascularity are normal. No adenopathy. No pneumothorax. No bone lesions. IMPRESSION: No edema or consolidation. Electronically Signed   By: Bretta Bang III M.D.   On: 07/17/2019 14:46    ____________________________________________   PROCEDURES  Procedure(s) performed (including Critical Care): Critical care time 20 minutes including reviewing old records speaking with the hospitalist and discussing the patient's symptoms with her.  Procedures   ____________________________________________   INITIAL IMPRESSION / ASSESSMENT AND PLAN / ED COURSE  Jessica Espinoza was evaluated in Emergency Department on 07/17/2019 for the symptoms described in the history of present illness. She was evaluated in the context of the global COVID-19 pandemic, which necessitated  consideration that the patient might be at risk for infection with the SARS-CoV-2 virus that causes COVID-19. Institutional protocols and algorithms that pertain to the evaluation of patients at risk for COVID-19 are in a state of rapid change based on information released by regulatory bodies including the CDC and federal and state organizations. These policies and algorithms were followed during the patient's care in the ED.    We will plan on admitting the patient to the hospital and replating her potassium.  Her QTC is dangerously high.  We will do this as soon as we get the CT report.         ____________________________________________   FINAL CLINICAL IMPRESSION(S) / ED DIAGNOSES  Final diagnoses:  Intractable vomiting with nausea, unspecified vomiting type  Abnormal EKG  Hypokalemia     ED Discharge Orders    None       Note:  This document was prepared using Dragon voice recognition software and may include unintentional dictation errors.    Nena Polio, MD 07/17/19 [phone]

## 2019-07-17 NOTE — ED Notes (Signed)
Patient transported to CT 

## 2019-07-17 NOTE — ED Triage Notes (Signed)
Patient c/o cyclic vomiting from cannabis use. Patient was seen at [facility] on 10/14 for same complaint. Patient denies smoking marijuana since [facility] visit. Patient c/o constant nausea and fatigue.

## 2019-07-17 NOTE — ED Notes (Signed)
Pt requested IV potassium instead of oral, Dr Cinda Quest made aware and changed order to IV.

## 2019-07-17 NOTE — H&P (Signed)
[facility] - [place] at [facility]   PATIENT NAME: Jessica Espinoza    MR#:  [id number]  DATE OF BIRTH:  18-Sep-1986  DATE OF ADMISSION:  07/17/2019  PRIMARY CARE PHYSICIAN: Carren Rang, PA-C   REQUESTING/REFERRING PHYSICIAN: Dorothea Glassman, MD  CHIEF COMPLAINT:   Chief Complaint  Patient presents with  . Emesis    HISTORY OF PRESENT ILLNESS:  Jessica Espinoza  is a 33 y.o. female with a known history of obesity and cyclic vomiting syndrome who presented to the ED with intractable nausea and vomiting over the last 3 days.  Patient states she has been vomiting more than 20 times a day.  She denies any hematemesis.  She endorses burning all throughout her abdomen and chest.  Her last bowel movement was 3 days ago.  She was seen in the ED at [facility] on 10/14.  She had a CT abdomen/pelvis at that visit, which was unremarkable.  She was discharged home.  She has had continued vomiting, so she came back to the emergency room today.  Patient states that she regularly smokes marijuana.  In the ED, she was hypertensive. Labs were significant for Na 132, K 2.5, glucose 124, total bili 1.9, WBC 18.2. CXR was negative. Hospitalists were called for admission.   PAST MEDICAL HISTORY:   Past Medical History:  Diagnosis Date  . BMI 37.0-37.9, adult   . Obesity     PAST SURGICAL HISTORY:   Past Surgical History:  Procedure Laterality Date  . NO PAST SURGERIES      SOCIAL HISTORY:   Social History   Tobacco Use  . Smoking status: Former Smoker    Types: Cigarettes    Quit date: 05/07/2008    Years since quitting: 11.2  . Smokeless tobacco: Never Used  Substance Use Topics  . Alcohol use: No    FAMILY HISTORY:   Family History  Problem Relation Age of Onset  . CAD Neg Hx   . Hypertension Neg Hx   . Diabetes Neg Hx     DRUG ALLERGIES:  No Known Allergies  REVIEW OF SYSTEMS:   Review of Systems  Constitutional: Negative for chills and fever.   HENT: Positive for sore throat. Negative for congestion.   Eyes: Negative for blurred vision and double vision.  Respiratory: Negative for cough and shortness of breath.   Cardiovascular: Positive for chest pain. Negative for leg swelling.  Gastrointestinal: Positive for abdominal pain, nausea and vomiting. Negative for blood in stool and melena.  Genitourinary: Negative for dysuria and urgency.  Musculoskeletal: Negative for back pain and neck pain.  Neurological: Negative for dizziness and headaches.  Psychiatric/Behavioral: Negative for depression. The patient is not nervous/anxious.     MEDICATIONS AT HOME:   Prior to Admission medications   Medication Sig Start Date End Date Taking? Authorizing Provider  sertraline (ZOLOFT) 100 MG tablet Take 100 mg by mouth daily.    [provider]      VITAL SIGNS:  Blood pressure (!) 167/80, pulse 86, temperature 98.2 F (36.8 C), temperature source Oral, resp. rate 20, height 5\' 5"  (1.651 m), weight 105 kg, last menstrual period 07/14/2019, SpO2 99 %, unknown if currently breastfeeding.  PHYSICAL EXAMINATION:  Physical Exam  GENERAL:  33 y.o.-year-old patient lying in the bed with no acute distress.  EYES: Pupils equal, round, reactive to light and accommodation. No scleral icterus. Extraocular muscles intact.  HEENT: Head atraumatic, normocephalic. Oropharynx and nasopharynx clear.  NECK:  Supple,  no jugular venous distention. No thyroid enlargement, no tenderness.  LUNGS: Normal breath sounds bilaterally, no wheezing, rales,rhonchi or crepitation. No use of accessory muscles of respiration.  CARDIOVASCULAR: RRR, S1, S2 normal. No murmurs, rubs, or gallops.  ABDOMEN: Soft, nondistended. Bowel sounds present. No organomegaly or mass. + Diffuse mild tenderness to palpation, no rebound or guarding. EXTREMITIES: No pedal edema, cyanosis, or clubbing.  NEUROLOGIC: Cranial nerves II through XII are intact. Muscle strength 5/5 in all  extremities. Sensation intact. Gait not checked.  PSYCHIATRIC: The patient is alert and oriented x 3.  SKIN: No obvious rash, lesion, or ulcer.   LABORATORY PANEL:   CBC Recent Labs  Lab 07/17/19 1228  WBC 18.2*  HGB 14.0  HCT 40.5  PLT 363   ------------------------------------------------------------------------------------------------------------------  Chemistries  Recent Labs  Lab 07/17/19 1228  NA 132*  K 2.5*  CL 94*  CO2 21*  GLUCOSE 124*  BUN 8  CREATININE 0.47  CALCIUM 9.8  MG 2.0  AST 38  ALT 27  ALKPHOS 73  BILITOT 1.9*   ------------------------------------------------------------------------------------------------------------------  Cardiac Enzymes No results for input(s): TROPONINI in the last 168 hours. ------------------------------------------------------------------------------------------------------------------  RADIOLOGY:  Dg Chest Portable 1 View  Result Date: 07/17/2019 CLINICAL DATA:  Chest pain EXAM: PORTABLE CHEST 1 VIEW COMPARISON:  None FINDINGS: Lungs are clear. Heart size and pulmonary vascularity are normal. No adenopathy. No pneumothorax. No bone lesions. IMPRESSION: No edema or consolidation. Electronically Signed   By: Lowella Grip III M.D.   On: 07/17/2019 14:46      IMPRESSION AND PLAN:   Intractable nausea and vomiting-likely cyclic vomiting syndrome in the setting of regular marijuana use. -CT abdomen/pelvis was ordered by ED physician and is pending -Check UDS and urine pregnancy test -IV antiemetic use is limited by QT prolongation- continue IV haldol for now -Start clears and advance diet as tolerated -IV fluids  QT prolongation- likely due to hypokalemia -Replace K -Recheck EKG  Leukocytosis- likely reactive. No signs of infection -CT abdomen/pelvis pending -Check UA -CXR unremarkable  Hypokalemia- due vomiting -Magnesium is normal -Replete -Recheck K tonight  Hypovolemic hyponatremia- due  dehydration in the setting of vomiting -IVFs -Recheck sodium in the morning  Hyperglycemia- no diagnosis of diabetes -Check A1c  Hyperbilirubinemia- likely due to dehydration -If persistent, could consider RUQ [place] -Recheck CMP in the morning  Hypertension- likely due to pain and nausea -IV labetalol prn  Depression-stable -Continue home Zoloft  All the records are reviewed and case discussed with ED provider. Management plans discussed with the patient, family and they are in agreement.  CODE STATUS: Full  TOTAL TIME TAKING CARE OF THIS PATIENT: 45 minutes.    Berna Spare Jeramy Dimmick M.D on 07/17/2019 at 2:57 PM  Between 7am to 6pm - Pager [phone]  After 6pm go to [email or web address] - password [email or web address] [facility]  [facility] [place] [facility]  Office  [phone]  CC: Primary care physician; Wayland Denis, PA-C   Note: This dictation was prepared with Dragon dictation along with smaller phrase technology. Any transcriptional errors that result from this process are unintentional.

## 2019-07-18 ENCOUNTER — Other Ambulatory Visit: Payer: Self-pay

## 2019-07-18 ENCOUNTER — Encounter: Payer: Self-pay | Admitting: *Deleted

## 2019-07-18 LAB — COMPREHENSIVE METABOLIC PANEL
ALT: 35 U/L (ref 0–44)
AST: 44 U/L — ABNORMAL HIGH (ref 15–41)
Albumin: 4.3 g/dL (ref 3.5–5.0)
Alkaline Phosphatase: 62 U/L (ref 38–126)
Anion gap: 10 (ref 5–15)
BUN: <5 mg/dL — ABNORMAL LOW (ref 6–20)
CO2: 26 mmol/L (ref 22–32)
Calcium: 8.7 mg/dL — ABNORMAL LOW (ref 8.9–10.3)
Chloride: 97 mmol/L — ABNORMAL LOW (ref 98–111)
Creatinine, Ser: 0.37 mg/dL — ABNORMAL LOW (ref 0.44–1.00)
GFR calc Af Amer: >60 mL/min (ref >60)
GFR calc non Af Amer: >60 mL/min (ref >60)
Glucose, Bld: 128 mg/dL — ABNORMAL HIGH (ref 70–99)
Potassium: 2.5 mmol/L — CL (ref 3.5–5.1)
Sodium: 133 mmol/L — ABNORMAL LOW (ref 135–145)
Total Bilirubin: 1.5 mg/dL — ABNORMAL HIGH (ref 0.3–1.2)
Total Protein: 7.7 g/dL (ref 6.5–8.1)

## 2019-07-18 LAB — POTASSIUM: Potassium: 3.3 mmol/L — ABNORMAL LOW (ref 3.5–5.1)

## 2019-07-18 LAB — CBC
HCT: 38.2 % (ref 36.0–46.0)
Hemoglobin: 12.8 g/dL (ref 12.0–15.0)
MCH: 26.5 pg (ref 26.0–34.0)
MCHC: 33.5 g/dL (ref 30.0–36.0)
MCV: 79.1 fL — ABNORMAL LOW (ref 80.0–100.0)
Platelets: 241 10*3/uL (ref 150–400)
RBC: 4.83 MIL/uL (ref 3.87–5.11)
RDW: 12.5 % (ref 11.5–15.5)
WBC: 11.5 10*3/uL — ABNORMAL HIGH (ref 4.0–10.5)
nRBC: 0 % (ref 0.0–0.2)

## 2019-07-18 LAB — SARS CORONAVIRUS 2 (TAT 6-24 HRS): SARS Coronavirus 2: NEGATIVE

## 2019-07-18 LAB — HEMOGLOBIN A1C
Hgb A1c MFr Bld: 5.3 % (ref 4.8–5.6)
Mean Plasma Glucose: 105.41 mg/dL

## 2019-07-18 LAB — MAGNESIUM: Magnesium: 2.1 mg/dL (ref 1.7–2.4)

## 2019-07-18 MED ORDER — POTASSIUM CHLORIDE IN NACL 20-0.9 MEQ/L-% IV SOLN
INTRAVENOUS | Status: DC
  Administered 2019-07-18: 11:00:00 via INTRAVENOUS
  Filled 2019-07-18 (×3): qty 1000

## 2019-07-18 MED ORDER — POTASSIUM CHLORIDE CRYS ER 20 MEQ PO TBCR
40.0000 meq | EXTENDED_RELEASE_TABLET | ORAL | Status: DC
  Administered 2019-07-18 (×3): 40 meq via ORAL
  Filled 2019-07-18 (×3): qty 2

## 2019-07-18 NOTE — Progress Notes (Signed)
[facility] - [place] at [facility]     PATIENT NAME: Jessica Espinoza    MR#:  [id number]  DATE OF BIRTH:  07/20/1986  SUBJECTIVE:   Patient admitted to the hospital secondary to intractable nausea and vomiting.  She says she feels a bit better today.  Tolerating her clear liquids.  Still has significantly hypokalemic.  REVIEW OF SYSTEMS:    Review of Systems  Constitutional: Negative for chills and fever.  HENT: Negative for congestion and tinnitus.   Eyes: Negative for blurred vision and double vision.  Respiratory: Negative for cough, shortness of breath and wheezing.   Cardiovascular: Negative for chest pain, orthopnea and PND.  Gastrointestinal: Positive for nausea. Negative for abdominal pain, diarrhea and vomiting.  Genitourinary: Negative for dysuria and hematuria.  Neurological: Negative for dizziness, sensory change and focal weakness.  All other systems reviewed and are negative.   Nutrition: Clear liquid and will advance to REgular Tolerating Diet: Yes Tolerating PT: Ambulatory  DRUG ALLERGIES:  No Known Allergies  VITALS:  Blood pressure (!) 163/73, pulse 66, temperature 98.6 F (37 C), temperature source Oral, resp. rate 18, height 5\' 5"  (1.651 m), weight 105 kg, last menstrual period 07/14/2019, SpO2 99 %, unknown if currently breastfeeding.  PHYSICAL EXAMINATION:   Physical Exam  GENERAL:  33 y.o.-year-old patient lying in bed in no acute distress.  EYES: Pupils equal, round, reactive to light and accommodation. No scleral icterus. Extraocular muscles intact.  HEENT: Head atraumatic, normocephalic. Oropharynx and nasopharynx clear.  NECK:  Supple, no jugular venous distention. No thyroid enlargement, no tenderness.  LUNGS: Normal breath sounds bilaterally, no wheezing, rales, rhonchi. No use of accessory muscles of respiration.  CARDIOVASCULAR: S1, S2 normal. No murmurs, rubs, or gallops.  ABDOMEN: Soft, nontender, nondistended. Bowel  sounds present. No organomegaly or mass.  EXTREMITIES: No cyanosis, clubbing or edema b/l.    NEUROLOGIC: Cranial nerves II through XII are intact. No focal Motor or sensory deficits b/l.   PSYCHIATRIC: The patient is alert and oriented x 3.  SKIN: No obvious rash, lesion, or ulcer.    LABORATORY PANEL:   CBC Recent Labs  Lab 07/18/19 0419  WBC 11.5*  HGB 12.8  HCT 38.2  PLT 241   ------------------------------------------------------------------------------------------------------------------  Chemistries  Recent Labs  Lab 07/18/19 0419  NA 133*  K 2.5*  CL 97*  CO2 26  GLUCOSE 128*  BUN <5*  CREATININE 0.37*  CALCIUM 8.7*  MG 2.1  AST 44*  ALT 35  ALKPHOS 62  BILITOT 1.5*   ------------------------------------------------------------------------------------------------------------------  Cardiac Enzymes No results for input(s): TROPONINI in the last 168 hours. ------------------------------------------------------------------------------------------------------------------  RADIOLOGY:  Ct Abdomen Pelvis W Contrast  Result Date: 07/17/2019 CLINICAL DATA:  Abdominal pain, nausea, vomiting EXAM: CT ABDOMEN AND PELVIS WITH CONTRAST TECHNIQUE: Multidetector CT imaging of the abdomen and pelvis was performed using the standard protocol following bolus administration of intravenous contrast. CONTRAST:  07/19/2019 OMNIPAQUE IOHEXOL 300 MG/ML  SOLN COMPARISON:  07/15/2019 FINDINGS: Lower chest: No acute abnormality. Hepatobiliary: Decreased attenuation of the fatty parenchyma suggesting diffuse fatty infiltration. Additional area of low attenuation adjacent to the falciform ligament suggests a more focal area of fatty infiltration. No new hepatic lesions. Gallbladder is unremarkable. No biliary dilatation. Pancreas: Unremarkable. No pancreatic ductal dilatation or surrounding inflammatory changes. Spleen: Normal in size without focal abnormality. Adrenals/Urinary Tract: Adrenal  glands are unremarkable. Kidneys are normal, without renal calculi, focal lesion, or hydronephrosis. Bladder is unremarkable. Stomach/Bowel: Stomach is within normal limits. Stomach  and proximal small bowel are well distended with oral contrast. Appendix appears normal. No evidence of bowel wall thickening, distention, or inflammatory changes. Vascular/Lymphatic: No significant vascular findings are present. No enlarged abdominal or pelvic lymph nodes. Reproductive: Uterus and right adnexa are unremarkable. Previously seen left ovarian cyst has slightly decreased in size now measuring 3.7 cm (previously 4.1 cm on 07/15/2019). Other: No abdominal wall hernia or abnormality. No abdominopelvic ascites. Musculoskeletal: No acute or significant osseous findings. IMPRESSION: 1. No acute abdominopelvic findings. 2. Fatty infiltration of the liver. 3. Slight interval decrease in size of left ovarian cyst now measuring 3.7 cm (previously 4.1 cm on 07/15/2019). Electronically Signed   By: Davina Poke M.D.   On: 07/17/2019 15:58   Dg Chest Portable 1 View  Result Date: 07/17/2019 CLINICAL DATA:  Chest pain EXAM: PORTABLE CHEST 1 VIEW COMPARISON:  None FINDINGS: Lungs are clear. Heart size and pulmonary vascularity are normal. No adenopathy. No pneumothorax. No bone lesions. IMPRESSION: No edema or consolidation. Electronically Signed   By: Lowella Grip III M.D.   On: 07/17/2019 14:46     ASSESSMENT AND PLAN:   33 year old female with past medical history of obesity, substance abuse who presents to the hospital due to intractable nausea and vomiting.  1.  Intractable nausea vomiting-suspected to be secondary to cyclical vomiting syndrome secondary to marijuana abuse. -Continue supportive care with IV fluids, antiemetics. -Patient is slowly clinically improving.  2.  Hypokalemia-secondary to the intractable nausea vomiting.  Improving with supplementation. -Magnesium level is normal.  3.   Hyponatremia-hypovolemic hyponatremia-secondary to volume loss from nausea vomiting. -Improving with IV fluids.  4.  Depression-continue Zoloft.  5.  Leukocytosis-likely stress mediated from her nausea vomiting. CT abdomen pelvis was negative for acute pathology.  Patient is afebrile hemodynamically stable.  Possible discharge tomorrow if potassium level improved and clinically no nausea or vomiting.   All the records are reviewed and case discussed with Care Management/Social Worker. Management plans discussed with the patient, family and they are in agreement.  CODE STATUS: Full code  DVT Prophylaxis: Lovenox  TOTAL TIME TAKING CARE OF THIS PATIENT: 30 minutes.   POSSIBLE D/C IN 1-2 DAYS, DEPENDING ON CLINICAL CONDITION.   Henreitta Leber M.D on 07/18/2019 at 1:37 PM  Between 7am to 6pm - Pager - [phone]  After 6pm go to [email or web address] - Proofreader  [facility] [place] [facility]  Office  [phone]  CC: Primary care physician; Wayland Denis, PA-C

## 2019-07-18 NOTE — Discharge Instructions (Signed)
Hypokalemia Hypokalemia means that the amount of potassium in the blood is lower than normal. Potassium is a chemical (electrolyte) that helps regulate the amount of fluid in the body. It also stimulates muscle tightening (contraction) and helps nerves work properly. Normally, most of the body's potassium is inside cells, and only a very small amount is in the blood. Because the amount in the blood is so small, minor changes to potassium levels in the blood can be life-threatening. What are the causes? This condition may be caused by:  Antibiotic medicine.  Diarrhea or vomiting. Taking too much of a medicine that helps you have a bowel movement (laxative) can cause diarrhea and lead to hypokalemia.  Chronic kidney disease (CKD).  Medicines that help the body get rid of excess fluid (diuretics).  Eating disorders, such as bulimia.  Low magnesium levels in the body.  Sweating a lot. What are the signs or symptoms? Symptoms of this condition include:  Weakness.  Constipation.  Fatigue.  Muscle cramps.  Mental confusion.  Skipped heartbeats or irregular heartbeat (palpitations).  Tingling or numbness. How is this diagnosed? This condition is diagnosed with a blood test. How is this treated? This condition may be treated by:  Taking potassium supplements by mouth.  Adjusting the medicines that you take.  Eating more foods that contain a lot of potassium. If your potassium level is very low, you may need to get potassium through an IV and be monitored in the hospital. Follow these instructions at home:   Take over-the-counter and prescription medicines only as told by your health care provider. This includes vitamins and supplements.  Eat a healthy diet. A healthy diet includes fresh fruits and vegetables, whole grains, healthy fats, and lean proteins.  If instructed, eat more foods that contain a lot of potassium. This includes: ? Nuts, such as peanuts and  pistachios. ? Seeds, such as sunflower seeds and pumpkin seeds. ? Peas, lentils, and lima beans. ? Whole grain and bran cereals and breads. ? Fresh fruits and vegetables, such as apricots, avocado, bananas, cantaloupe, kiwi, oranges, tomatoes, asparagus, and potatoes. ? Orange juice. ? Tomato juice. ? Red meats. ? Yogurt.  Keep all follow-up visits as told by your health care provider. This is important. Contact a health care provider if you:  Have weakness that gets worse.  Feel your heart pounding or racing.  Vomit.  Have diarrhea.  Have diabetes (diabetes mellitus) and you have trouble keeping your blood sugar (glucose) in your target range. Get help right away if you:  Have chest pain.  Have shortness of breath.  Have vomiting or diarrhea that lasts for more than 2 days.  Faint. Summary  Hypokalemia means that the amount of potassium in the blood is lower than normal.  This condition is diagnosed with a blood test.  Hypokalemia may be treated by taking potassium supplements, adjusting the medicines that you take, or eating more foods that are high in potassium.  If your potassium level is very low, you may need to get potassium through an IV and be monitored in the hospital. This information is not intended to replace advice given to you by your health care provider. Make sure you discuss any questions you have with your health care provider. Document Released: 09/17/2005 Document Revised: 04/30/2018 Document Reviewed: 04/30/2018 Elsevier Patient Education  2020 [employer] Inc[employer]   Nausea, Adult Nausea is the feeling that you have an upset stomach or that you are about to vomit. Nausea on its own  is not usually a serious concern, but it may be an early sign of a more serious medical problem. As nausea gets worse, it can lead to vomiting. If vomiting develops, or if you are not able to drink enough fluids, you are at risk of becoming dehydrated. Dehydration can make you  tired and thirsty, cause you to have a dry mouth, and decrease how often you urinate. Older adults and people with other diseases or a weak disease-fighting system (immune system) are at higher risk for dehydration. The main goals of treating your nausea are:  To relieve your nausea.  To limit repeated nausea episodes.  To prevent vomiting and dehydration. Follow these instructions at home: Watch your symptoms for any changes. Tell your health care provider about them. Follow these instructions as told by your health care provider. Eating and drinking      Take an oral rehydration solution (ORS). This is a drink that is sold at pharmacies and retail stores.  Drink clear fluids slowly and in small amounts as you are able. Clear fluids include water, ice chips, low-calorie sports drinks, and fruit juice that has water added (diluted fruit juice).  Eat bland, easy-to-digest foods in small amounts as you are able. These foods include bananas, applesauce, rice, lean meats, toast, and crackers.  Avoid drinking fluids that contain a lot of sugar or caffeine, such as energy drinks, sports drinks, and soda.  Avoid alcohol.  Avoid spicy or fatty foods. General instructions  Take over-the-counter and prescription medicines only as told by your health care provider.  Rest at home while you recover.  Drink enough fluid to keep your urine pale yellow.  Breathe slowly and deeply when you feel nauseous.  Avoid smelling things that have strong odors.  Wash your hands often using soap and water. If soap and water are not available, use hand sanitizer.  Make sure that all people in your household wash their hands well and often.  Keep all follow-up visits as told by your health care provider. This is important. Contact a health care provider if:  Your nausea gets worse.  Your nausea does not go away after two days.  You vomit.  You cannot drink fluids without vomiting.  You have any  of the following: ? New symptoms. ? A fever. ? A headache. ? Muscle cramps. ? A rash. ? Pain while urinating.  You feel light-headed or dizzy. Get help right away if:  You have pain in your chest, neck, arm, or jaw.  You feel extremely weak or you faint.  You have vomit that is bright red or looks like coffee grounds.  You have bloody or black stools or stools that look like tar.  You have a severe headache, a stiff neck, or both.  You have severe pain, cramping, or bloating in your abdomen.  You have difficulty breathing or are breathing very quickly.  Your heart is beating very quickly.  Your skin feels cold and clammy.  You feel confused.  You have signs of dehydration, such as: ? Dark urine, very little urine, or no urine. ? Cracked lips. ? Dry mouth. ? Sunken eyes. ? Sleepiness. ? Weakness. These symptoms may represent a serious problem that is an emergency. Do not wait to see if the symptoms will go away. Get medical help right away. Call your local emergency services (911 in the [place] Do not drive yourself to the hospital. Summary  Nausea is the feeling that you have an upset stomach  or that you are about to vomit. Nausea on its own is not usually a serious concern, but it may be an early sign of a more serious medical problem.  If vomiting develops, or if you are not able to drink enough fluids, you are at risk of becoming dehydrated.  Follow recommendations for eating and drinking and take over-the-counter and prescription medicines only as told by your health care provider.  Contact a health care provider right away if your symptoms worsen or you have new symptoms.  Keep all follow-up visits as told by your health care provider. This is important. This information is not intended to replace advice given to you by your health care provider. Make sure you discuss any questions you have with your health care provider. Document Released: 10/25/2004 Document  Revised: 02/25/2018 Document Reviewed: 02/25/2018 Elsevier Patient Education  2020 Elsevier Inc.   Nausea and Vomiting, Adult Nausea is the feeling that you have an upset stomach or that you are about to vomit. Vomiting is when stomach contents are thrown up and out of the mouth as a result of nausea. Vomiting can make you feel weak and cause you to become dehydrated. Dehydration can make you feel tired and thirsty, cause you to have a dry mouth, and decrease how often you urinate. Older adults and people with other diseases or a weak disease-fighting system (immune system) are at higher risk for dehydration. It is important to treat your nausea and vomiting as told by your health care provider. Follow these instructions at home: Watch your symptoms for any changes. Tell your health care provider about them. Follow these instructions to care for yourself at home. Eating and drinking      Take an oral rehydration solution (ORS). This is a drink that is sold at pharmacies and retail stores.  Drink clear fluids slowly and in small amounts as you are able. Clear fluids include water, ice chips, low-calorie sports drinks, and fruit juice that has water added (diluted fruit juice).  Eat bland, easy-to-digest foods in small amounts as you are able. These foods include bananas, applesauce, rice, lean meats, toast, and crackers.  Avoid fluids that contain a lot of sugar or caffeine, such as energy drinks, sports drinks, and soda.  Avoid alcohol.  Avoid spicy or fatty foods. General instructions  Take over-the-counter and prescription medicines only as told by your health care provider.  Drink enough fluid to keep your urine pale yellow.  Wash your hands often using soap and water. If soap and water are not available, use hand sanitizer.  Make sure that all people in your household wash their hands well and often.  Rest at home while you recover.  Watch your condition for any  changes.  Breathe slowly and deeply when you feel nauseated.  Keep all follow-up visits as told by your health care provider. This is important. Contact a health care provider if:  Your symptoms get worse.  You have new symptoms.  You have a fever.  You cannot drink fluids without vomiting.  Your nausea does not go away after 2 days.  You feel light-headed or dizzy.  You have a headache.  You have muscle cramps.  You have a rash.  You have pain while urinating. Get help right away if:  You have pain in your chest, neck, arm, or jaw.  You feel extremely weak or you faint.  You have persistent vomiting.  You have vomit that is bright red or looks like  black coffee grounds.  You have bloody or black stools or stools that look like tar.  You have a severe headache, a stiff neck, or both.  You have severe pain, cramping, or bloating in your abdomen.  You have difficulty breathing, or you are breathing very quickly.  Your heart is beating very quickly.  Your skin feels cold and clammy.  You feel confused.  You have signs of dehydration, such as: ? Dark urine, very little urine, or no urine. ? Cracked lips. ? Dry mouth. ? Sunken eyes. ? Sleepiness. ? Weakness. These symptoms may represent a serious problem that is an emergency. Do not wait to see if the symptoms will go away. Get medical help right away. Call your local emergency services (911 in the [place] Do not drive yourself to the hospital. Summary  Nausea is the feeling that you have an upset stomach or that you are about to vomit. As nausea gets worse, it can lead to vomiting. Vomiting can make you feel weak and cause you to become dehydrated.  Follow instructions from your health care provider about eating and drinking to prevent dehydration.  Take over-the-counter and prescription medicines only as told by your health care provider.  Contact your health care provider if your symptoms get worse, or  you have new symptoms.  Keep all follow-up visits as told by your health care provider. This is important. This information is not intended to replace advice given to you by your health care provider. Make sure you discuss any questions you have with your health care provider. Document Released: 09/17/2005 Document Revised: 01/09/2019 Document Reviewed: 02/25/2018 Elsevier Patient Education  2020 [employer].

## 2019-07-18 NOTE — Progress Notes (Signed)
Tamera A Minus to be D/C'd home with husband per MD order.  Discussed prescriptions and follow up appointments with the patient. Prescriptions given to patient, medication list explained in detail. Pt verbalized understanding.  Allergies as of 07/18/2019   No Known Allergies      Medication List     TAKE these medications    fluticasone 50 MCG/ACT nasal spray Commonly known as: FLONASE Place 1 spray into the nose 2 (two) times daily.   montelukast 10 MG tablet Commonly known as: SINGULAIR Take 10 mg by mouth at bedtime.   sertraline 100 MG tablet Commonly known as: ZOLOFT Take 100 mg by mouth daily.        Vitals:   07/18/19 0429 07/18/19 1351  BP: (!) 163/73 (!) 168/91  Pulse: 66 69  Resp: 18   Temp: 98.6 F (37 C) 98.2 F (36.8 C)  SpO2: 99% 100%    Skin clean, dry and intact without evidence of skin break down, no evidence of skin tears noted. IV catheter discontinued intact. Site without signs and symptoms of complications. Dressing and pressure applied. Pt denies pain at this time. No complaints noted.  An After Visit Summary was printed and given to the patient. Patient escorted via [place], and D/C home via private auto.  [place] A Cheryl Stabenow

## 2019-07-19 NOTE — Discharge Summary (Signed)
[facility] [place] [facility]   PATIENT NAME: Jessica Espinoza    MR#:  [id number]  DATE OF BIRTH:  1985/11/06  DATE OF ADMISSION:  07/17/2019 ADMITTING PHYSICIAN: Campbell Stall, MD  DATE OF DISCHARGE: 07/18/2019  4:59 PM  PRIMARY CARE PHYSICIAN: Carren Rang, PA-C    ADMISSION DIAGNOSIS:  Hypokalemia [E87.6] Abnormal EKG [R94.31] Intractable vomiting with nausea, unspecified vomiting type [R11.2]  DISCHARGE DIAGNOSIS:  Active Problems:   Intractable nausea and vomiting   SECONDARY DIAGNOSIS:   Past Medical History:  Diagnosis Date  . BMI 37.0-37.9, adult   . Obesity     HOSPITAL COURSE:   33 year old female with past medical history of obesity, substance abuse who presents to the hospital due to intractable nausea and vomiting.  1.  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 -Patient was treated supportively with IV fluids, antiemetics.  With supportive care patient's clinical symptoms have significantly improved.  Her diet has been slowly advanced from clear to regular diet which she is tolerating without any exacerbation of her symptoms.  She was strongly advised to abstain from marijuana abuse.  Patient is therefore being discharged home.  2.  Hypokalemia-secondary to the intractable nausea vomiting.   Improved and resolved with supplementation.  3.  Hyponatremia-hypovolemic hyponatremia-secondary to volume loss from nausea vomiting. -Resolved with IV fluid hydration.  4.  Depression- pt. Will continue Zoloft.  5.  Leukocytosis-likely stress mediated from her nausea vomiting. CT abdomen pelvis was negative for acute pathology.Marland Kitchen  DISCHARGE CONDITIONS:   Stable.   CONSULTS OBTAINED:    DRUG ALLERGIES:  No Known Allergies  DISCHARGE MEDICATIONS:   Allergies as of 07/18/2019   No Known Allergies     Medication List    TAKE these medications   fluticasone 50  MCG/ACT nasal spray Commonly known as: FLONASE Place 1 spray into the nose 2 (two) times daily.   montelukast 10 MG tablet Commonly known as: SINGULAIR Take 10 mg by mouth at bedtime.   sertraline 100 MG tablet Commonly known as: ZOLOFT Take 100 mg by mouth daily.         DISCHARGE INSTRUCTIONS:   DIET:  Regular diet  DISCHARGE CONDITION:  Stable  ACTIVITY:  Activity as tolerated  OXYGEN:  Home Oxygen: No.   Oxygen Delivery: room air  DISCHARGE LOCATION:  Home   If you experience worsening of your admission symptoms, develop shortness of breath, life threatening emergency, suicidal or homicidal thoughts you must seek medical attention immediately by calling 911 or calling your MD immediately  if symptoms less severe.  You Must read complete instructions/literature along with all the possible adverse reactions/side effects for all the Medicines you take and that have been prescribed to you. Take any new Medicines after you have completely understood and accpet all the possible adverse reactions/side effects.   Please note  You were cared for by a hospitalist during your hospital stay. If you have any questions about your discharge medications or the care you received while you were in the hospital after you are discharged, you can call the unit and asked to speak with the hospitalist on call if the hospitalist that took care of you is not available. Once you are discharged, your primary care physician will handle any further medical issues. Please note that NO REFILLS for any discharge medications will be authorized once you are discharged, as it is imperative that you return to your primary care physician (or  establish a relationship with a primary care physician if you do not have one) for your aftercare needs so that they can reassess your need for medications and monitor your lab values.     DATA REVIEW:   CBC Recent Labs  Lab 07/18/19 0419  WBC 11.5*  HGB  12.8  HCT 38.2  PLT 241    Chemistries  Recent Labs  Lab 07/18/19 0419 07/18/19 1442  NA 133*  --   K 2.5* 3.3*  CL 97*  --   CO2 26  --   GLUCOSE 128*  --   BUN <5*  --   CREATININE 0.37*  --   CALCIUM 8.7*  --   MG 2.1  --   AST 44*  --   ALT 35  --   ALKPHOS 62  --   BILITOT 1.5*  --     Cardiac Enzymes No results for input(s): TROPONINI in the last 168 hours.  Microbiology Results  Results for orders placed or performed during the hospital encounter of 07/17/19  SARS CORONAVIRUS 2 (TAT 6-24 HRS) Nasopharyngeal Nasopharyngeal Swab     Status: None   Collection Time: 07/17/19  4:35 PM   Specimen: Nasopharyngeal Swab  Result Value Ref Range Status   SARS Coronavirus 2 NEGATIVE NEGATIVE Final    Comment: (NOTE) SARS-CoV-2 target nucleic acids are NOT DETECTED. The SARS-CoV-2 RNA is generally detectable in upper and lower respiratory specimens during the acute phase of infection. Negative results do not preclude SARS-CoV-2 infection, do not rule out co-infections with other pathogens, and should not be used as the sole basis for treatment or other patient management decisions. Negative results must be combined with clinical observations, patient history, and epidemiological information. The expected result is Negative. Fact Sheet for Patients: SugarRoll.be Fact Sheet for Healthcare Providers: https://www.woods-mathews.com/ This test is not yet approved or cleared by the [place] FDA and  has been authorized for detection and/or diagnosis of SARS-CoV-2 by FDA under an Emergency Use Authorization (EUA). This EUA will remain  in effect (meaning this test can be used) for the duration of the COVID-19 declaration under Section 56 4(b)(1) of the Act, 21 U.S.C. section 360bbb-3(b)(1), unless the authorization is terminated or revoked sooner. Performed at [facility], [place] [address], [place], [place] [address]      RADIOLOGY:  Ct Abdomen Pelvis W Contrast  Result Date: 07/17/2019 CLINICAL DATA:  Abdominal pain, nausea, vomiting EXAM: CT ABDOMEN AND PELVIS WITH CONTRAST TECHNIQUE: Multidetector CT imaging of the abdomen and pelvis was performed using the standard protocol following bolus administration of intravenous contrast. CONTRAST:  142mL OMNIPAQUE IOHEXOL 300 MG/ML  SOLN COMPARISON:  07/15/2019 FINDINGS: Lower chest: No acute abnormality. Hepatobiliary: Decreased attenuation of the fatty parenchyma suggesting diffuse fatty infiltration. Additional area of low attenuation adjacent to the falciform ligament suggests a more focal area of fatty infiltration. No new hepatic lesions. Gallbladder is unremarkable. No biliary dilatation. Pancreas: Unremarkable. No pancreatic ductal dilatation or surrounding inflammatory changes. Spleen: Normal in size without focal abnormality. Adrenals/Urinary Tract: Adrenal glands are unremarkable. Kidneys are normal, without renal calculi, focal lesion, or hydronephrosis. Bladder is unremarkable. Stomach/Bowel: Stomach is within normal limits. Stomach and proximal small bowel are well distended with oral contrast. Appendix appears normal. No evidence of bowel wall thickening, distention, or inflammatory changes. Vascular/Lymphatic: No significant vascular findings are present. No enlarged abdominal or pelvic lymph nodes. Reproductive: Uterus and right adnexa are unremarkable. Previously seen left ovarian cyst has slightly decreased  in size now measuring 3.7 cm (previously 4.1 cm on 07/15/2019). Other: No abdominal wall hernia or abnormality. No abdominopelvic ascites. Musculoskeletal: No acute or significant osseous findings. IMPRESSION: 1. No acute abdominopelvic findings. 2. Fatty infiltration of the liver. 3. Slight interval decrease in size of left ovarian cyst now measuring 3.7 cm (previously 4.1 cm on 07/15/2019). Electronically Signed   By: Duanne GuessNicholas  Plundo M.D.   On:  07/17/2019 15:58   Dg Chest Portable 1 View  Result Date: 07/17/2019 CLINICAL DATA:  Chest pain EXAM: PORTABLE CHEST 1 VIEW COMPARISON:  None FINDINGS: Lungs are clear. Heart size and pulmonary vascularity are normal. No adenopathy. No pneumothorax. No bone lesions. IMPRESSION: No edema or consolidation. Electronically Signed   By: Bretta BangWilliam  Woodruff III M.D.   On: 07/17/2019 14:46      Management plans discussed with the patient, family and they are in agreement.  CODE STATUS:  Code Status History    Date Active Date Inactive Code Status Order ID Comments User Context   07/17/2019 1721 07/18/2019 1959 Full Code [id number]  Mayo, Allyn KennerKaty Dodd, MD ED   TOTAL TIME TAKING CARE OF THIS PATIENT: 40 minutes.    [place] SirenVivek J Justun Anaya M.D on 07/19/2019 at 2:24 PM  Between 7am to 6pm - Pager - [phone]  After 6pm go to [email or web address] - Social research officer, governmentpassword [email or web address]  [facility]  Office  [phone]  CC: Primary care physician; Carren Rangarter, Danielle, PA-C

## 2020-02-01 LAB — HM PAP SMEAR: HM Pap smear: NEGATIVE

## 2020-06-13 IMAGING — CT CT ABD-PELV W/ CM
2 of 4 series · 16 of 46 positions shown, 18 images · IV contrast (APPLIED)
Comparison: 07/15/2019

CLINICAL DATA: Abdominal pain, nausea, vomiting

EXAM:
CT ABDOMEN AND PELVIS WITH CONTRAST
TECHNIQUE: Multidetector CT imaging of the abdomen and pelvis was performed
using the standard protocol following bolus administration of
intravenous contrast.
CONTRAST:  125mL OMNIPAQUE IOHEXOL 300 MG/ML  SOLN

[Series 2: routine abd/pel with · axial · 0.93mm/px · z∈[-527,-72]mm · 13 of 101 slices shown, 15 images]
[im 5/101  soft-tissue]
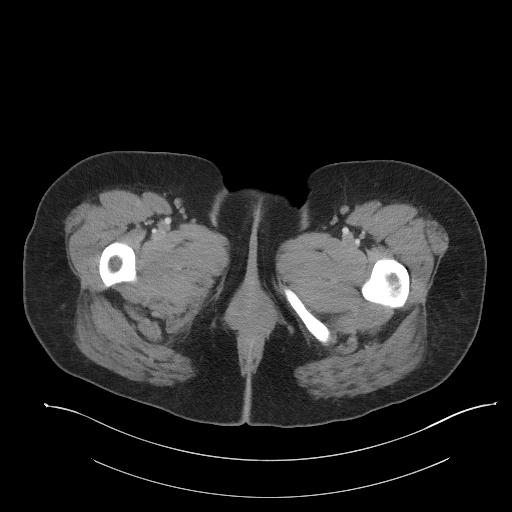
[im 5/101  bone]
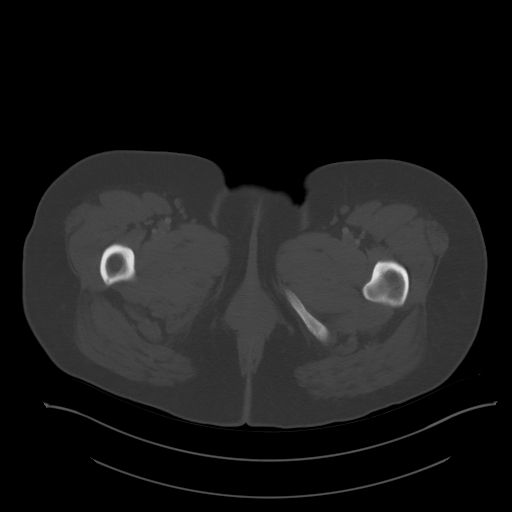
[im 13/101  soft-tissue]
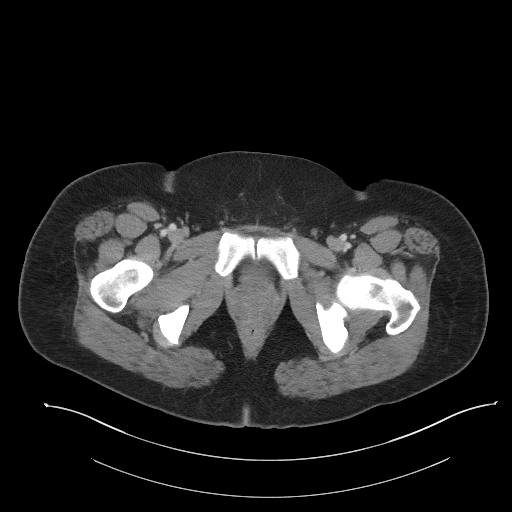
[im 21/101  soft-tissue]
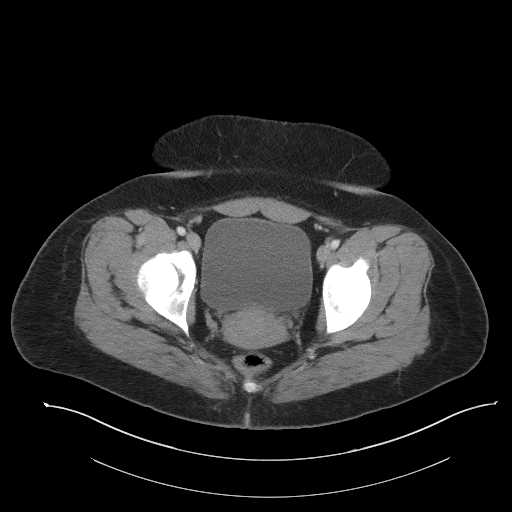
[im 30/101  soft-tissue]
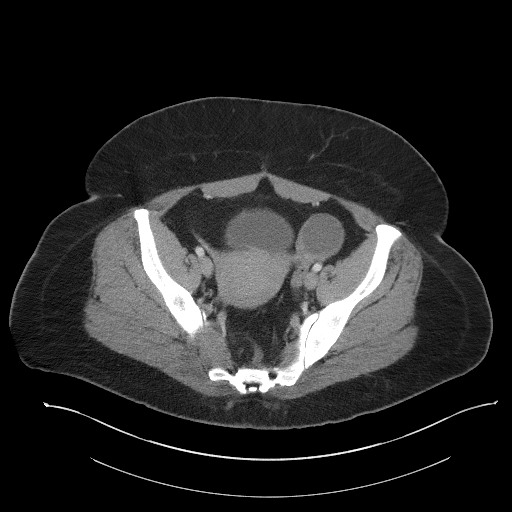
[im 34/101  soft-tissue]
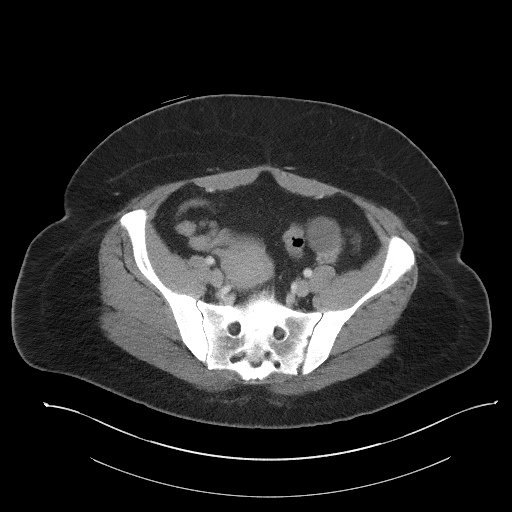
[im 42/101  soft-tissue]
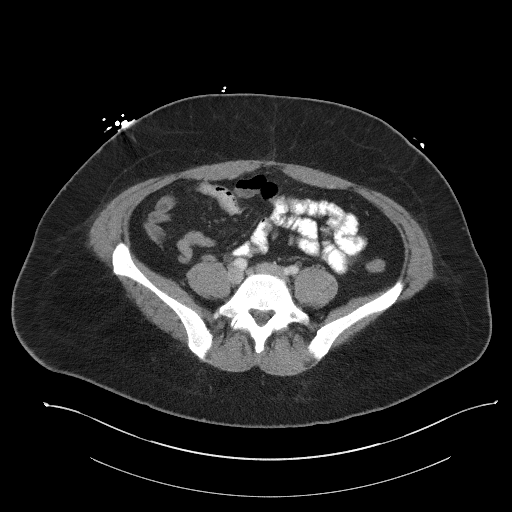
[im 51/101  soft-tissue]
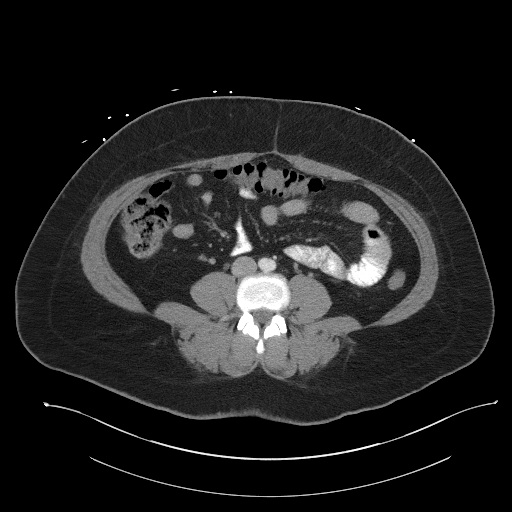
[im 59/101  soft-tissue]
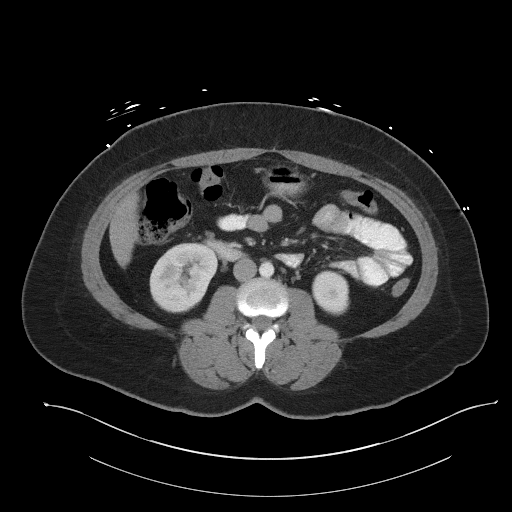
[im 67/101  soft-tissue]
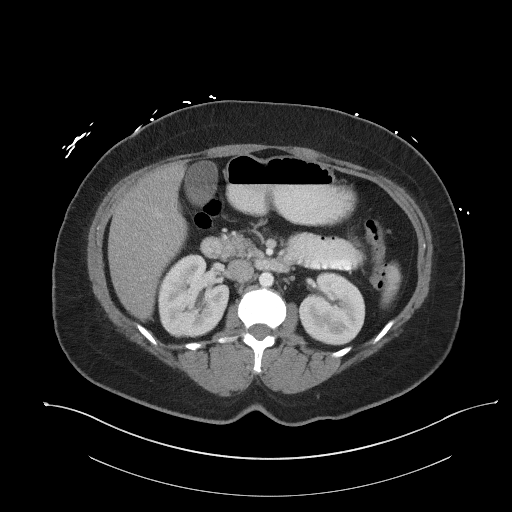
[im 67/101  bone]
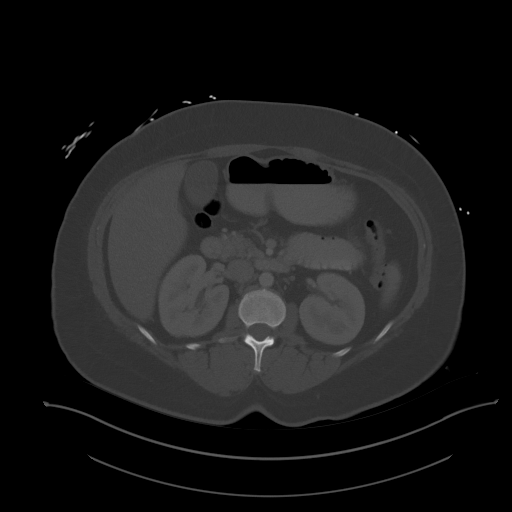
[im 71/101  soft-tissue]
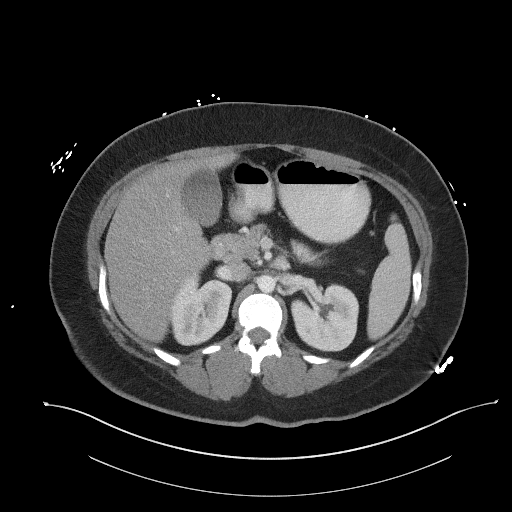
[im 80/101  soft-tissue]
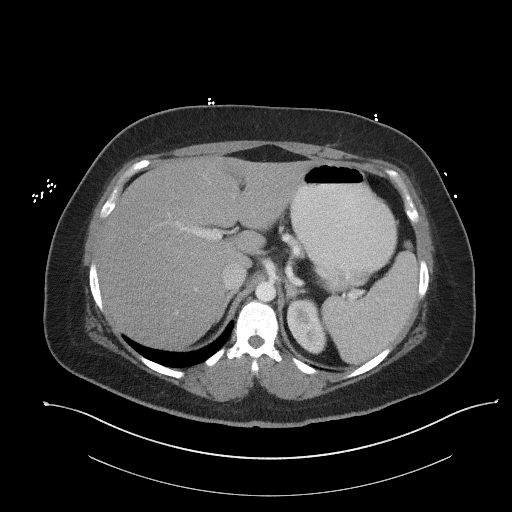
[im 88/101  soft-tissue]
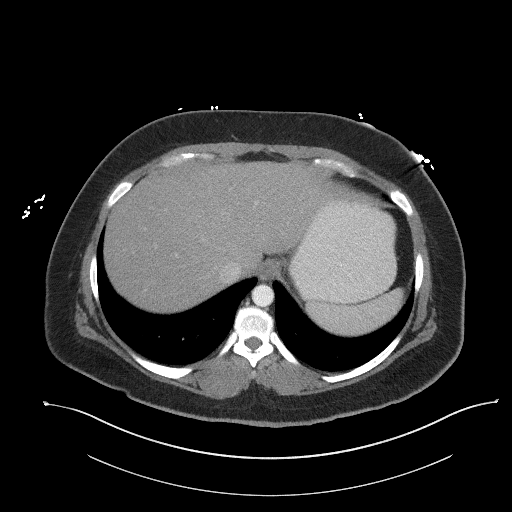
[im 96/101  soft-tissue]
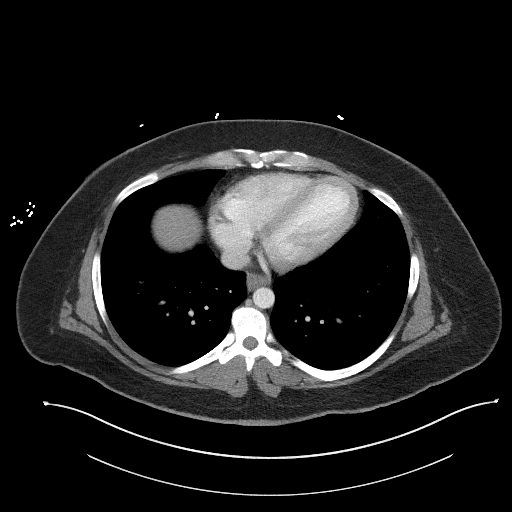

[Series 5: coronal st · coronal · 0.80mm/px · 3 of 89 slices shown]
[im 30/89  soft-tissue]
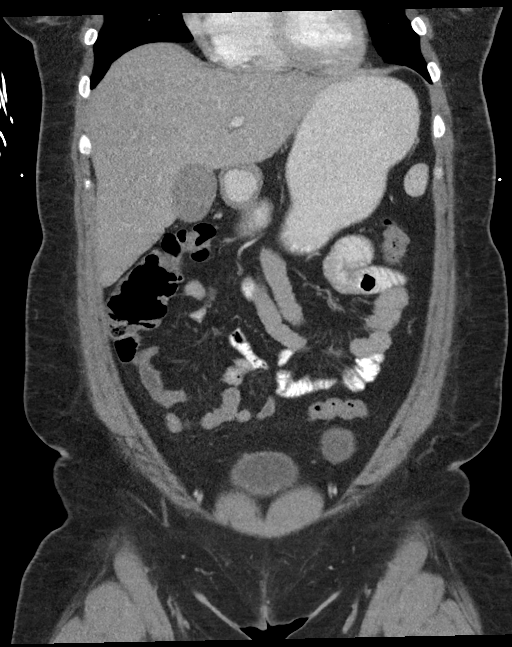
[im 40/89  soft-tissue]
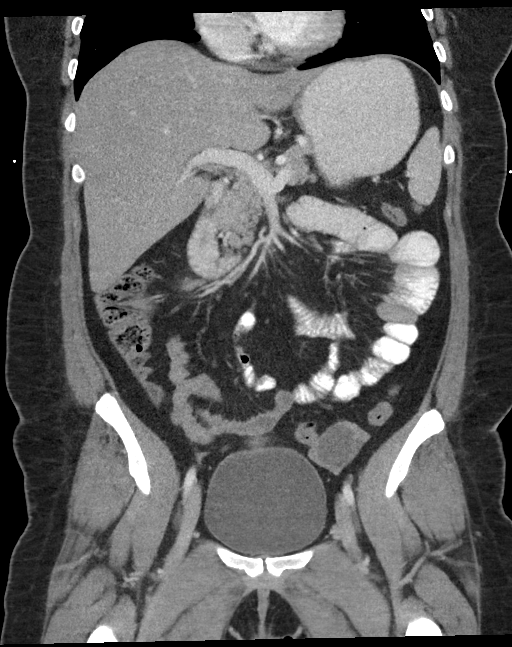
[im 49/89  soft-tissue]
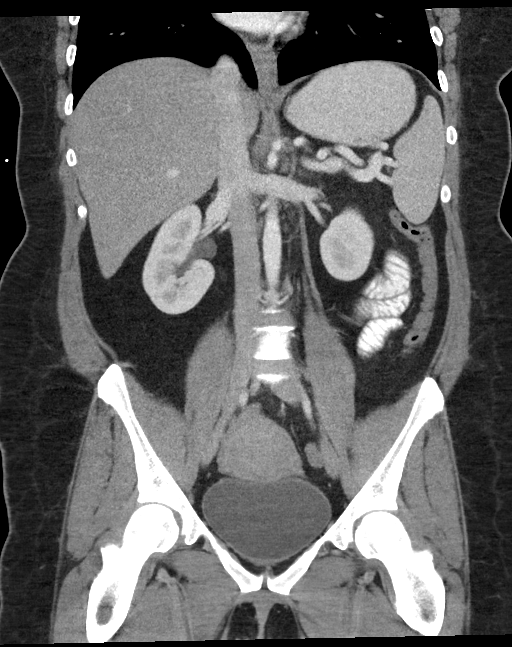

[16 of 46 positions shown; findings below may reference images not displayed]

FINDINGS: Lower chest: No acute abnormality.

Hepatobiliary: Decreased attenuation of the fatty parenchyma
suggesting diffuse fatty infiltration. Additional area of low
attenuation adjacent to the falciform ligament suggests a more focal
area of fatty infiltration. No new hepatic lesions. Gallbladder is
unremarkable. No biliary dilatation.

Pancreas: Unremarkable. No pancreatic ductal dilatation or
surrounding inflammatory changes.

Spleen: Normal in size without focal abnormality.

Adrenals/Urinary Tract: Adrenal glands are unremarkable. Kidneys are
normal, without renal calculi, focal lesion, or hydronephrosis.
Bladder is unremarkable.

Stomach/Bowel: Stomach is within normal limits. Stomach and proximal
small bowel are well distended with oral contrast. Appendix appears
normal. No evidence of bowel wall thickening, distention, or
inflammatory changes.

Vascular/Lymphatic: No significant vascular findings are present. No
enlarged abdominal or pelvic lymph nodes.

Reproductive: Uterus and right adnexa are unremarkable. Previously
seen left ovarian cyst has slightly decreased in size now measuring
3.7 cm (previously 4.1 cm on 07/15/2019).

Other: No abdominal wall hernia or abnormality. No abdominopelvic
ascites.

Musculoskeletal: No acute or significant osseous findings.
IMPRESSION: 1. No acute abdominopelvic findings.
2. Fatty infiltration of the liver.
3. Slight interval decrease in size of left ovarian cyst now
measuring 3.7 cm (previously 4.1 cm on 07/15/2019).

## 2020-08-09 DIAGNOSIS — I10 Essential (primary) hypertension: Secondary | ICD-10-CM | POA: Insufficient documentation

## 2021-06-01 ENCOUNTER — Emergency Department
Admission: EM | Admit: 2021-06-01 | Discharge: 2021-06-01 | Disposition: A | Discharge status: Home / Self Care | Payer: Commercial Managed Care - PPO | Attending: Emergency Medicine | Admitting: Emergency Medicine

## 2021-06-01 ENCOUNTER — Other Ambulatory Visit: Payer: Self-pay

## 2021-06-01 ENCOUNTER — Encounter: Payer: Self-pay | Admitting: Intensive Care

## 2021-06-01 DIAGNOSIS — T7840XA Allergy, unspecified, initial encounter: Secondary | ICD-10-CM

## 2021-06-01 DIAGNOSIS — T781XXA Other adverse food reactions, not elsewhere classified, initial encounter: Secondary | ICD-10-CM | POA: Diagnosis not present

## 2021-06-01 DIAGNOSIS — L299 Pruritus, unspecified: Secondary | ICD-10-CM | POA: Insufficient documentation

## 2021-06-01 DIAGNOSIS — X58XXXA Exposure to other specified factors, initial encounter: Secondary | ICD-10-CM | POA: Insufficient documentation

## 2021-06-01 DIAGNOSIS — R21 Rash and other nonspecific skin eruption: Secondary | ICD-10-CM | POA: Diagnosis present

## 2021-06-01 DIAGNOSIS — I10 Essential (primary) hypertension: Secondary | ICD-10-CM | POA: Insufficient documentation

## 2021-06-01 DIAGNOSIS — Z79899 Other long term (current) drug therapy: Secondary | ICD-10-CM | POA: Diagnosis not present

## 2021-06-01 HISTORY — DX: Attention-deficit hyperactivity disorder, unspecified type: F90.9

## 2021-06-01 HISTORY — DX: Essential (primary) hypertension: I10

## 2021-06-01 MED ORDER — EPINEPHRINE 0.3 MG/0.3ML IJ SOAJ: 0.3000 mg | Freq: Once | INTRAMUSCULAR | 0 refills | Status: AC

## 2021-06-01 MED ORDER — DEXAMETHASONE SODIUM PHOSPHATE 10 MG/ML IJ SOLN
10.0000 mg | Freq: Once | INTRAMUSCULAR | Status: AC
  Administered 2021-06-01: 10 mg via INTRAMUSCULAR
  Filled 2021-06-01: qty 1

## 2021-06-01 MED ORDER — FAMOTIDINE 20 MG PO TABS
20.0000 mg | ORAL_TABLET | Freq: Once | ORAL | Status: AC
  Administered 2021-06-01: 20 mg via ORAL
  Filled 2021-06-01: qty 1

## 2021-06-01 NOTE — ED Provider Notes (Signed)
[facility] Emergency Department Provider Note  ____________________________________________  Time seen: Approximately 4:37 PM  I have reviewed the triage vital signs and the nursing notes.   HISTORY  Chief Complaint Allergic Reaction    HPI Jessica Espinoza is a 35 y.o. female who presents the emergency department complaining of possible allergic reaction.  Patient states that she was eating lunch with a friend at a new restaurant but was not eating anything that she would not typically eat when she started breaking out in a rash around her neck.  She states that it was noticeable and she could feel it but it was not overly pruritic.  Rash spread all the way around neck to include anterior chest awake to posterior neck.  No difficulty breathing but patient states that her lips started to swell as to the end of her tongue.  Again no difficulty breathing or swallowing.  She took Benadryl and use hydrocortisone cream on the rash.  Symptoms improved and she states that she is essentially back to her baseline.  No emesis.  No chest tightness or cough.  Patient describes the rash as hive-like.       Past Medical History:  Diagnosis Date   ADHD    BMI 37.0-37.9, adult    Hypertension    Obesity     Patient Active Problem List   Diagnosis Date Noted   Intractable nausea and vomiting 07/17/2019   Cellulitis 03/21/2017   Routine postpartum follow-up 09/28/2015   Postpartum care and examination of lactating mother 09/28/2015   Postpartum care following vaginal delivery 09/28/2015   Supervision of normal pregnancy in third trimester 09/26/2015   Nausea and vomiting during pregnancy 07/07/2015   Obesity    BMI 37.0-37.9, adult     Past Surgical History:  Procedure Laterality Date   NO PAST SURGERIES      Prior to Admission medications   Medication Sig Start Date End Date Taking? Authorizing Provider  EPINEPHrine 0.3 mg/0.3 mL IJ SOAJ injection Inject 0.3  mg into the muscle once for 1 dose. 06/01/21 06/01/21 Yes Dorothey Oetken, Delorise Royals, PA-C  fluticasone (FLONASE) 50 MCG/ACT nasal spray Place 1 spray into the nose 2 (two) times daily. 06/30/19 06/29/20  [provider]  montelukast (SINGULAIR) 10 MG tablet Take 10 mg by mouth at bedtime. 06/30/19 06/29/20  [provider]  sertraline (ZOLOFT) 100 MG tablet Take 100 mg by mouth daily.    [provider]    Allergies Patient has no known allergies.  Family History  Problem Relation Age of Onset   CAD Neg Hx    Hypertension Neg Hx    Diabetes Neg Hx     Social History Social History   Tobacco Use   Smoking status: Former    Types: Cigarettes    Quit date: 05/07/2008    Years since quitting: 13.0   Smokeless tobacco: Never  Vaping Use   Vaping Use: Never used  Substance Use Topics   Alcohol use: No   Drug use: Not Currently    Types: Marijuana     Review of Systems  Constitutional: No fever/chills Eyes: No visual changes. No discharge ENT: No upper respiratory complaints. Cardiovascular: no chest pain. Respiratory: no cough. No SOB. Gastrointestinal: No abdominal pain.  No nausea, no vomiting.  No diarrhea.  No constipation. Musculoskeletal: Negative for musculoskeletal pain. Skin: Hive-like rash to the neck Neurological: Negative for headaches, focal weakness or numbness.  10 System ROS otherwise negative.  ____________________________________________  PHYSICAL EXAM:  VITAL SIGNS: ED Triage Vitals  Enc Vitals Group     BP 06/01/21 1519 130/81     Pulse Rate 06/01/21 1519 84     Resp 06/01/21 1519 18     Temp 06/01/21 1519 98.6 F (37 C)     Temp Source 06/01/21 1519 Oral     SpO2 06/01/21 1519 100 %     Weight 06/01/21 1519 237 lb (107.5 kg)     Height 06/01/21 1519 5\' 4"  (1.626 m)     Head Circumference --      Peak Flow --      Pain Score 06/01/21 1548 3     Pain Loc --      Pain Edu? --      Excl. in GC? --      Constitutional:  Alert and oriented. Well appearing and in no acute distress. Eyes: Conjunctivae are normal. PERRL. EOMI. Head: Atraumatic. ENT:      Ears:       Nose: No congestion/rhinnorhea.      Mouth/Throat: Mucous membranes are moist.  Neck: No stridor.    Cardiovascular: Normal rate, regular rhythm. Normal S1 and S2.  Good peripheral circulation. Respiratory: Normal respiratory effort without tachypnea or retractions. Lungs CTAB. Good air entry to the bases with no decreased or absent breath sounds. Gastrointestinal: Bowel sounds 4 quadrants. Soft and nontender to palpation. No guarding or rigidity. No palpable masses. No distention. No CVA tenderness. Musculoskeletal: Full range of motion to all extremities. No gross deformities appreciated. Neurologic:  Normal speech and language. No gross focal neurologic deficits are appreciated.  Skin:  Skin is warm, dry and intact. No rash noted. Psychiatric: Mood and affect are normal. Speech and behavior are normal. Patient exhibits appropriate insight and judgement.   ____________________________________________   LABS (all labs ordered are listed, but only abnormal results are displayed)  Labs Reviewed - No data to display ____________________________________________  EKG   ____________________________________________  RADIOLOGY   No results found.  ____________________________________________    PROCEDURES  Procedure(s) performed:    Procedures    Medications  famotidine (PEPCID) tablet 20 mg (20 mg Oral Given 06/01/21 1629)  dexamethasone (DECADRON) injection 10 mg (10 mg Intramuscular Given 06/01/21 1629)     ____________________________________________   INITIAL IMPRESSION / ASSESSMENT AND PLAN / ED COURSE  Pertinent labs & imaging results that were available during my care of the patient were reviewed by me and considered in my medical decision making (see chart for details).  Review of the [place] CSRS was performed in  accordance of the [facility] prior to dispensing any controlled drugs.           Patient's diagnosis is consistent with allergic reaction.  Patient presented to the emergency department after developing hives around her neck.  No systemic symptoms concerning for anaphylaxis.  Patient had almost complete resolution with Benadryl and hydrocortisone cream prior to arrival.  Patient famotidine and steroid here.  She will use nondrowsy antihistamine over the next several days.  Return precautions discussed with the patient.  Patient states that she has a strong familial history of people developing allergies midlife and having anaphylactic reaction so I will prescribe epinephrine the patient had no symptoms concerning for anaphylaxis today.  Instructed the patient on the use of epinephrine and that she would need to present to the ED if she does use an EpiPen for any reaction.  Patient verbalizes understanding of same..  Follow-up primary care as needed.  Patient is given ED precautions to return to the ED for any worsening or new symptoms.     ____________________________________________  FINAL CLINICAL IMPRESSION(S) / ED DIAGNOSES  Final diagnoses:  Allergic reaction, initial encounter      NEW MEDICATIONS STARTED DURING THIS VISIT:  ED Discharge Orders          Ordered    EPINEPHrine 0.3 mg/0.3 mL IJ SOAJ injection   Once        06/01/21 1644                This chart was dictated using voice recognition software/Dragon. Despite best efforts to proofread, errors can occur which can change the meaning. Any change was purely unintentional.    Racheal Patches, PA-C 06/01/21 1645    Arnaldo Natal, MD 06/01/21 2020

## 2021-06-01 NOTE — ED Triage Notes (Signed)
Patient reports rash started around neck and tingling of lips and throat while eating lunch. Reports able to swallow since and took benadryl tablet before arriving to ER and rubbed hydrocortisone on rash. Denies trouble breathing at this time. Reports some chest pressure that has subsided some.

## 2021-08-09 ENCOUNTER — Other Ambulatory Visit: Payer: Self-pay

## 2021-08-09 ENCOUNTER — Encounter: Payer: Self-pay | Admitting: Emergency Medicine

## 2021-08-09 ENCOUNTER — Ambulatory Visit
Admission: EM | Admit: 2021-08-09 | Discharge: 2021-08-09 | Disposition: A | Discharge status: Home / Self Care | Payer: Commercial Managed Care - PPO

## 2021-08-09 DIAGNOSIS — I10 Essential (primary) hypertension: Secondary | ICD-10-CM | POA: Diagnosis not present

## 2021-08-09 DIAGNOSIS — J0100 Acute maxillary sinusitis, unspecified: Secondary | ICD-10-CM | POA: Diagnosis not present

## 2021-08-09 DIAGNOSIS — J209 Acute bronchitis, unspecified: Secondary | ICD-10-CM | POA: Diagnosis not present

## 2021-08-09 MED ORDER — ALBUTEROL SULFATE HFA 108 (90 BASE) MCG/ACT IN AERS: 1.0000 | INHALATION_SPRAY | Freq: Four times a day (QID) | RESPIRATORY_TRACT | 0 refills | Status: DC | PRN

## 2021-08-09 MED ORDER — PREDNISONE 10 MG (21) PO TBPK: ORAL_TABLET | Freq: Every day | ORAL | 0 refills | Status: DC

## 2021-08-09 MED ORDER — AZITHROMYCIN 250 MG PO TABS: 250.0000 mg | ORAL_TABLET | Freq: Every day | ORAL | 0 refills | Status: DC

## 2021-08-09 MED ORDER — BENZONATATE 100 MG PO CAPS: 100.0000 mg | ORAL_CAPSULE | Freq: Three times a day (TID) | ORAL | 0 refills | Status: DC | PRN

## 2021-08-09 NOTE — Discharge Instructions (Addendum)
Take the Zithromax and prednisone as directed.  Use the albuterol inhaler as directed.  Follow-up with your primary care provider.   Your blood pressure is elevated today at 166/83.  Please have this rechecked by your primary care provider in 2-4 weeks.

## 2021-08-09 NOTE — ED Provider Notes (Signed)
Jessica Espinoza    CSN: [id number] Arrival date & time: 08/09/21  6789      History   Chief Complaint Chief Complaint  Patient presents with   Cough   Nasal Congestion   Sinus Pressure    HPI Jessica Espinoza is a 35 y.o. female.  Patient presents with 2-week history of sinus congestion, runny nose, sinus pressure, cough, shortness of breath.  She has been using her albuterol inhaler as needed but she thinks it may be out of date.  No fever, rash, vomiting, diarrhea, or other symptoms.  Her medical history includes hypertension.  The history is provided by the patient and medical records.   Past Medical History:  Diagnosis Date   ADHD    BMI 37.0-37.9, adult    Hypertension    Obesity     Patient Active Problem List   Diagnosis Date Noted   Intractable nausea and vomiting 07/17/2019   Cellulitis 03/21/2017   Routine postpartum follow-up 09/28/2015   Postpartum care and examination of lactating mother 09/28/2015   Postpartum care following vaginal delivery 09/28/2015   Supervision of normal pregnancy in third trimester 09/26/2015   Nausea and vomiting during pregnancy 07/07/2015   Obesity    BMI 37.0-37.9, adult     Past Surgical History:  Procedure Laterality Date   NO PAST SURGERIES      OB History     Gravida  3   Para  3   Term  3   Preterm      AB      Living  3      SAB      IAB      Ectopic      Multiple  0   Live Births  3            Home Medications    Prior to Admission medications   Medication Sig Start Date End Date Taking? Authorizing Provider  albuterol (VENTOLIN HFA) 108 (90 Base) MCG/ACT inhaler Inhale 1-2 puffs into the lungs every 6 (six) hours as needed for wheezing or shortness of breath. 08/09/21  Yes Mickie Bail, NP  azithromycin (ZITHROMAX) 250 MG tablet Take 1 tablet (250 mg total) by mouth daily. Take first 2 tablets together, then 1 every day until finished. 08/09/21  Yes Mickie Bail, NP   benzonatate (TESSALON) 100 MG capsule Take 1 capsule (100 mg total) by mouth 3 (three) times daily as needed for cough. 08/09/21  Yes Mickie Bail, NP  cetirizine (ZYRTEC) 10 MG tablet Take by mouth.   Yes [provider]  Cholecalciferol 50 MCG (2000 UT) TABS Take by mouth.   Yes [provider]  dicyclomine (BENTYL) 20 MG tablet Take 20 mg by mouth every 6 (six) hours as needed. 06/14/21  Yes [provider]  EPINEPHrine 0.3 mg/0.3 mL IJ SOAJ injection Inject into the muscle. 06/01/21  Yes [provider]  Multiple Vitamin (MULTI-VITAMIN) tablet Take 1 tablet by mouth daily.   Yes [provider]  NIFEdipine (ADALAT CC) 30 MG 24 hr tablet Take 30 mg by mouth daily. 08/02/21  Yes [provider]  predniSONE (STERAPRED UNI-PAK 21 TAB) 10 MG (21) TBPK tablet Take by mouth daily. As directed 08/09/21  Yes Mickie Bail, NP  sertraline (ZOLOFT) 100 MG tablet Take 100 mg by mouth daily.   Yes [provider]  VYVANSE 50 MG capsule Take 50 mg by mouth daily. 08/01/21  Yes [provider]  fluticasone (FLONASE) 50 MCG/ACT nasal spray Place 1 spray into the nose 2 (two) times daily. 06/30/19 06/29/20  [provider]  montelukast (SINGULAIR) 10 MG tablet Take 10 mg by mouth at bedtime. 06/30/19 06/29/20  [provider]    Family History Family History  Problem Relation Age of Onset   CAD Neg Hx    Hypertension Neg Hx    Diabetes Neg Hx     Social History Social History   Tobacco Use   Smoking status: Former    Types: Cigarettes    Quit date: 05/07/2008    Years since quitting: 13.2   Smokeless tobacco: Never  Vaping Use   Vaping Use: Never used  Substance Use Topics   Alcohol use: No   Drug use: Not Currently    Types: Marijuana     Allergies   Patient has no known allergies.   Review of Systems Review of Systems  Constitutional:  Negative for chills and fever.  HENT:  Positive for congestion,  postnasal drip, rhinorrhea and sinus pressure. Negative for ear pain and sore throat.   Respiratory:  Positive for cough and shortness of breath.   Cardiovascular:  Negative for chest pain and palpitations.  Gastrointestinal:  Negative for diarrhea and vomiting.  Skin:  Negative for color change and rash.  All other systems reviewed and are negative.   Physical Exam Triage Vital Signs ED Triage Vitals  Enc Vitals Group     BP      Pulse      Resp      Temp      Temp src      SpO2      Weight      Height      Head Circumference      Peak Flow      Pain Score      Pain Loc      Pain Edu?      Excl. in GC?    No data found.  Updated Vital Signs BP (!) 166/83 (BP Location: Left Arm)   Pulse 95   Temp 99 F (37.2 C) (Oral)   LMP 07/27/2021   SpO2 98%   Visual Acuity Right Eye Distance:   Left Eye Distance:   Bilateral Distance:    Right Eye Near:   Left Eye Near:    Bilateral Near:     Physical Exam Vitals and nursing note reviewed.  Constitutional:      General: She is not in acute distress.    Appearance: She is well-developed. She is obese.  HENT:     Head: Normocephalic and atraumatic.     Right Ear: Tympanic membrane normal.     Left Ear: Tympanic membrane normal.     Nose: Nose normal.     Mouth/Throat:     Mouth: Mucous membranes are moist.     Pharynx: Oropharynx is clear.  Eyes:     Conjunctiva/sclera: Conjunctivae normal.  Cardiovascular:     Rate and Rhythm: Normal rate and regular rhythm.     Heart sounds: Normal heart sounds.  Pulmonary:     Effort: Pulmonary effort is normal. No respiratory distress.     Breath sounds: Normal breath sounds. No wheezing.     Comments: No respiratory distress.  Nonproductive cough during exam. Abdominal:     Palpations: Abdomen is soft.     Tenderness: There is no abdominal tenderness.  Musculoskeletal:     Cervical back:  Neck supple.  Skin:    General: Skin is warm and dry.  Neurological:     Mental  Status: She is alert.  Psychiatric:        Mood and Affect: Mood normal.        Behavior: Behavior normal.     UC Treatments / Results  Labs (all labs ordered are listed, but only abnormal results are displayed) Labs Reviewed - No data to display  EKG   Radiology No results found.  Procedures Procedures (including critical care time)  Medications Ordered in UC Medications - No data to display  Initial Impression / Assessment and Plan / UC Course  I have reviewed the triage vital signs and the nursing notes.  Pertinent labs & imaging results that were available during my care of the patient were reviewed by me and considered in my medical decision making (see chart for details).  Acute sinusitis, acute bronchitis, elevated blood pressure with hypertension.  Patient has a deep nonproductive cough throughout exam.  No respiratory distress.  Lungs are clear.  O2 sat 98% on room air.  She has been symptomatic for 2 weeks.  Treating with albuterol inhaler, prednisone, Zithromax.  Instructed her to follow-up with her PCP.  Also discussed that her blood pressure is elevated today and needs to be rechecked by her PCP in 2 to 4 weeks.  Education provided on managing hypertension.  She agrees to plan of care.   Final Clinical Impressions(s) / UC Diagnoses   Final diagnoses:  Acute non-recurrent maxillary sinusitis  Acute bronchitis, unspecified organism  Elevated blood pressure reading in office with diagnosis of hypertension     Discharge Instructions      Take the Zithromax and prednisone as directed.  Use the albuterol inhaler as directed.  Follow-up with your primary care provider.   Your blood pressure is elevated today at 166/83.  Please have this rechecked by your primary care provider in 2-4 weeks.          ED Prescriptions     Medication Sig Dispense Auth. Provider   albuterol (VENTOLIN HFA) 108 (90 Base) MCG/ACT inhaler Inhale 1-2 puffs into the lungs every 6  (six) hours as needed for wheezing or shortness of breath. 18 g Mickie Bail, NP   predniSONE (STERAPRED UNI-PAK 21 TAB) 10 MG (21) TBPK tablet Take by mouth daily. As directed 21 tablet Mickie Bail, NP   azithromycin (ZITHROMAX) 250 MG tablet Take 1 tablet (250 mg total) by mouth daily. Take first 2 tablets together, then 1 every day until finished. 6 tablet Mickie Bail, NP   benzonatate (TESSALON) 100 MG capsule Take 1 capsule (100 mg total) by mouth 3 (three) times daily as needed for cough. 21 capsule Mickie Bail, NP      PDMP not reviewed this encounter.   Mickie Bail, NP 08/09/21 [phone]

## 2021-08-09 NOTE — ED Triage Notes (Signed)
Pt c/o deep cough, chest congestion, runny nose, and sinus pressure off and on x 2 weeks

## 2021-10-18 ENCOUNTER — Other Ambulatory Visit: Payer: Self-pay

## 2021-10-18 MED ORDER — VYVANSE 50 MG PO CAPS
ORAL_CAPSULE | ORAL | 0 refills | Status: DC
  Filled 2021-12-22: qty 30, 30d supply, fill #0

## 2021-10-18 MED ORDER — VYVANSE 50 MG PO CAPS
ORAL_CAPSULE | ORAL | 0 refills | Status: DC
  Filled 2021-10-18 – 2021-11-23 (×2): qty 30, 30d supply, fill #0

## 2021-10-18 MED ORDER — VYVANSE 50 MG PO CAPS: ORAL_CAPSULE | ORAL | 0 refills | Status: DC

## 2021-11-22 ENCOUNTER — Emergency Department
Admission: EM | Admit: 2021-11-22 | Discharge: 2021-11-22 | Disposition: A | Discharge status: Home / Self Care | Payer: 59 | Attending: Emergency Medicine | Admitting: Emergency Medicine

## 2021-11-22 ENCOUNTER — Other Ambulatory Visit: Payer: Self-pay

## 2021-11-22 ENCOUNTER — Emergency Department: Payer: 59

## 2021-11-22 ENCOUNTER — Encounter: Payer: Self-pay | Admitting: Emergency Medicine

## 2021-11-22 DIAGNOSIS — M79604 Pain in right leg: Secondary | ICD-10-CM | POA: Insufficient documentation

## 2021-11-22 MED ORDER — METHOCARBAMOL 500 MG PO TABS
500.0000 mg | ORAL_TABLET | Freq: Three times a day (TID) | ORAL | 0 refills | Status: AC | PRN
Start: 2021-11-22 — End: 2021-11-27

## 2021-11-22 MED ORDER — METHOCARBAMOL 500 MG PO TABS: 500.0000 mg | ORAL_TABLET | Freq: Three times a day (TID) | ORAL | 0 refills | Status: DC | PRN

## 2021-11-22 MED ORDER — MELOXICAM 15 MG PO TABS: 15.0000 mg | ORAL_TABLET | Freq: Every day | ORAL | 2 refills | Status: DC

## 2021-11-22 MED ORDER — KETOROLAC TROMETHAMINE 30 MG/ML IJ SOLN
30.0000 mg | Freq: Once | INTRAMUSCULAR | Status: AC
  Administered 2021-11-22: 30 mg via INTRAMUSCULAR
  Filled 2021-11-22: qty 1

## 2021-11-22 NOTE — Discharge Instructions (Signed)
Take meloxicam once daily for pain and inflammation. 

## 2021-11-22 NOTE — ED Triage Notes (Signed)
C/O right knee and calf pain since 11/14/2021.  REferred to ED by [facility] for DVT eval.

## 2021-11-22 NOTE — ED Provider Notes (Signed)
[facility] Provider Note  Patient Contact: 4:40 PM (approximate)   History   Leg Pain   HPI  Jessica Espinoza is a 36 y.o. female presents to the emergency department with right calf pain since 11/14/2021.  Patient was referred from [facility] clinic for DVT rule out.  Patient states that she was recently bedbound with gallbladder pain for 3 days.  She denies any history of smoking, recent surgery or prolonged immobilization.  No prior history of DVT or PE.  No recent strenuous exercise or heavy lifting.  Patient states that the calf pain is keeping her up at night.  She has not noticed any redness of the right lower extremity but states that her calf is painful to the touch.      Physical Exam   Triage Vital Signs: ED Triage Vitals  Enc Vitals Group     BP 11/22/21 1631 (!) 147/85     Pulse Rate 11/22/21 1631 82     Resp 11/22/21 1631 18     Temp 11/22/21 1631 99.2 F (37.3 C)     Temp Source 11/22/21 1631 Oral     SpO2 11/22/21 1631 100 %     Weight 11/22/21 1613 236 lb 15.9 oz (107.5 kg)     Height 11/22/21 1613 5\' 4"  (1.626 m)     Head Circumference --      Peak Flow --      Pain Score 11/22/21 1613 7     Pain Loc --      Pain Edu? --      Excl. in GC? --     Most recent vital signs: Vitals:   11/22/21 1631  BP: (!) 147/85  Pulse: 82  Resp: 18  Temp: 99.2 F (37.3 C)  SpO2: 100%     General: Alert and in no acute distress. Eyes:  PERRL. EOMI. Head: No acute traumatic findings ENT:      Ears:       Nose: No congestion/rhinnorhea.      Mouth/Throat: Mucous membranes are moist. Neck: No stridor. No cervical spine tenderness to palpation. Cardiovascular:  Good peripheral perfusion Respiratory: Normal respiratory effort without tachypnea or retractions. Lungs CTAB. Good air entry to the bases with no decreased or absent breath sounds. Gastrointestinal: Bowel sounds 4 quadrants. Soft and nontender to palpation. No guarding or  rigidity. No palpable masses. No distention. No CVA tenderness. Musculoskeletal: Full range of motion to all extremities.  Patient has right calf tenderness to palpation.  Palpable dorsalis pedis pulse bilaterally and symmetrically. Neurologic:  No gross focal neurologic deficits are appreciated.  Skin:   No rash noted Other:   ED Results / Procedures / Treatments   Labs (all labs ordered are listed, but only abnormal results are displayed) Labs Reviewed - No data to display      RADIOLOGY  I personally viewed and evaluated these images as part of my medical decision making, as well as reviewing the written report by the radiologist.  ED Provider Interpretation:  I personally reviewed venous ultrasound of right lower extremity and agree with radiologist interpretation.  No evidence of DVT.  PROCEDURES:  Critical Care performed: No  Procedures   MEDICATIONS ORDERED IN ED: Medications  ketorolac (TORADOL) 30 MG/ML injection 30 mg (30 mg Intramuscular Given 11/22/21 1838)     IMPRESSION / MDM / ASSESSMENT AND PLAN / ED COURSE  I reviewed the triage vital signs and the nursing notes.  Differential diagnosis includes, but is not limited to, DVT, MSK pain,   Assessment and plan:  Calf pain:  36 year old female presents to the emergency department with 2 weeks of calf pain following a 3-day period of being mostly bedbound.  Vital signs are reassuring at triage.  On physical exam, patient was alert, active and nontoxic-appearing.  Patient had no edema or erythema overlying the right lower extremity.  Venous ultrasound was obtained which showed no evidence of DVT.  Recommended daily meloxicam, ice and elevation at home.  Recommended following up with orthopedics if pain persists.  All patient questions were answered.   FINAL CLINICAL IMPRESSION(S) / ED DIAGNOSES   Final diagnoses:  Right leg pain     Rx / DC Orders   ED Discharge Orders           Ordered    meloxicam (MOBIC) 15 MG tablet  Daily        11/22/21 1811    methocarbamol (ROBAXIN) 500 MG tablet  Every 8 hours PRN        11/22/21 1818             Note:  This document was prepared using Dragon voice recognition software and may include unintentional dictation errors.   Pia Mau [place], PA-C 11/22/21 1842    Dionne Bucy, MD 11/22/21 2253

## 2021-11-23 ENCOUNTER — Other Ambulatory Visit: Payer: Self-pay

## 2021-11-23 MED ORDER — METHOCARBAMOL 500 MG PO TABS
ORAL_TABLET | ORAL | 0 refills | Status: DC
  Filled 2021-11-23: qty 15, 5d supply, fill #0

## 2021-11-23 MED ORDER — MELOXICAM 15 MG PO TABS
ORAL_TABLET | ORAL | 2 refills | Status: DC
  Filled 2021-11-23: qty 30, 30d supply, fill #0

## 2021-12-22 ENCOUNTER — Other Ambulatory Visit: Payer: Self-pay

## 2021-12-25 ENCOUNTER — Other Ambulatory Visit: Payer: Self-pay

## 2021-12-25 MED ORDER — CARESTART COVID-19 HOME TEST VI KIT
PACK | 0 refills | Status: DC
  Filled 2021-12-25: qty 2, 4d supply, fill #0

## 2021-12-26 ENCOUNTER — Other Ambulatory Visit: Payer: Self-pay

## 2021-12-26 MED ORDER — AMOXICILLIN-POT CLAVULANATE 875-125 MG PO TABS
ORAL_TABLET | ORAL | 0 refills | Status: DC
  Filled 2021-12-26: qty 20, 10d supply, fill #0

## 2021-12-26 MED ORDER — PREDNISONE 20 MG PO TABS
ORAL_TABLET | ORAL | 0 refills | Status: DC
  Filled 2021-12-26: qty 5, 5d supply, fill #0

## 2022-01-04 ENCOUNTER — Other Ambulatory Visit: Payer: Self-pay

## 2022-01-04 MED ORDER — OLMESARTAN MEDOXOMIL 20 MG PO TABS
ORAL_TABLET | ORAL | 1 refills | Status: DC
  Filled 2022-01-04: qty 30, 30d supply, fill #0
  Filled 2022-02-02: qty 30, 30d supply, fill #1

## 2022-01-04 MED ORDER — SERTRALINE HCL 50 MG PO TABS
50.0000 mg | ORAL_TABLET | Freq: Every day | ORAL | 1 refills | Status: DC
  Filled 2022-01-04: qty 30, 30d supply, fill #0
  Filled 2022-02-02: qty 30, 30d supply, fill #1
  Filled 2022-02-20: qty 30, 30d supply, fill #2
  Filled 2022-03-18: qty 30, 30d supply, fill #3
  Filled 2022-04-14: qty 30, 30d supply, fill #4
  Filled 2022-06-01: qty 30, 30d supply, fill #5

## 2022-01-04 MED ORDER — DRYSOL 20 % EX SOLN
CUTANEOUS | 1 refills | Status: DC
  Filled 2022-01-04 – 2022-03-18 (×3): qty 60, 30d supply, fill #0
  Filled 2022-05-11 – 2022-06-01 (×2): qty 60, 30d supply, fill #1

## 2022-01-15 ENCOUNTER — Other Ambulatory Visit: Payer: Self-pay

## 2022-01-15 MED ORDER — VYVANSE 60 MG PO CAPS
ORAL_CAPSULE | ORAL | 0 refills | Status: DC
  Filled 2022-01-15: qty 30, 30d supply, fill #0

## 2022-02-02 ENCOUNTER — Other Ambulatory Visit: Payer: Self-pay

## 2022-02-16 ENCOUNTER — Emergency Department
Admission: EM | Admit: 2022-02-16 | Discharge: 2022-02-16 | Disposition: A | Discharge status: Home / Self Care | Payer: PRIVATE HEALTH INSURANCE | Attending: Emergency Medicine | Admitting: Emergency Medicine

## 2022-02-16 ENCOUNTER — Other Ambulatory Visit: Payer: Self-pay

## 2022-02-16 DIAGNOSIS — I10 Essential (primary) hypertension: Secondary | ICD-10-CM | POA: Insufficient documentation

## 2022-02-16 DIAGNOSIS — T754XXA Electrocution, initial encounter: Secondary | ICD-10-CM | POA: Insufficient documentation

## 2022-02-16 LAB — CK: Total CK: 296 U/L — ABNORMAL HIGH (ref 38–234)

## 2022-02-16 LAB — CBC
HCT: 37.2 % (ref 36.0–46.0)
Hemoglobin: 12 g/dL (ref 12.0–15.0)
MCH: 25.9 pg — ABNORMAL LOW (ref 26.0–34.0)
MCHC: 32.3 g/dL (ref 30.0–36.0)
MCV: 80.3 fL (ref 80.0–100.0)
Platelets: 238 10*3/uL (ref 150–400)
RBC: 4.63 MIL/uL (ref 3.87–5.11)
RDW: 13.7 % (ref 11.5–15.5)
WBC: 6.3 10*3/uL (ref 4.0–10.5)
nRBC: 0 % (ref 0.0–0.2)

## 2022-02-16 LAB — COMPREHENSIVE METABOLIC PANEL
ALT: 21 U/L (ref 0–44)
AST: 21 U/L (ref 15–41)
Albumin: 4.3 g/dL (ref 3.5–5.0)
Alkaline Phosphatase: 77 U/L (ref 38–126)
Anion gap: 6 (ref 5–15)
BUN: 10 mg/dL (ref 6–20)
CO2: 28 mmol/L (ref 22–32)
Calcium: 9.5 mg/dL (ref 8.9–10.3)
Chloride: 104 mmol/L (ref 98–111)
Creatinine, Ser: 0.56 mg/dL (ref 0.44–1.00)
GFR, Estimated: >60 mL/min (ref >60)
Glucose, Bld: 104 mg/dL — ABNORMAL HIGH (ref 70–99)
Potassium: 3.8 mmol/L (ref 3.5–5.1)
Sodium: 138 mmol/L (ref 135–145)
Total Bilirubin: 0.3 mg/dL (ref 0.3–1.2)
Total Protein: 7.3 g/dL (ref 6.5–8.1)

## 2022-02-16 NOTE — ED Provider Notes (Signed)
[facility] Provider Note    Event Date/Time   First MD Initiated Contact with Patient 02/16/22 1658     (approximate)   History   Electric Shock   HPI  Jessica Espinoza is a 36 y.o. female  who, per PCP note dated 9 days ago has history of HTN, who presents to the emergency department today because of concern for electrical shock.  The patient states that she went to grab a plug to pull it out of the wall.  When she grabbed the plug she felt a shock.  She saw the referring wires.  She then pulled the plug out of the outlet.  She did have some numbness to her left hand and tingling going up her arm.  She felt like her heart was racing.  The time my exam she still has a little numbness to her fingers.  Physical Exam   Triage Vital Signs: ED Triage Vitals  Enc Vitals Group     BP 02/16/22 1507 137/90     Pulse Rate 02/16/22 1507 79     Resp 02/16/22 1507 16     Temp 02/16/22 1507 98.9 F (37.2 C)     Temp Source 02/16/22 1507 Oral     SpO2 02/16/22 1507 98 %     Weight 02/16/22 1508 220 lb (99.8 kg)     Height 02/16/22 1508 5\' 4"  (1.626 m)     Head Circumference --      Peak Flow --      Pain Score 02/16/22 1508 3     Pain Loc --      Pain Edu? --      Excl. in [place]? --     Most recent vital signs: Vitals:   02/16/22 1507 02/16/22 1641  BP: 137/90 (!) 148/83  Pulse: 79 80  Resp: 16 20  Temp: 98.9 F (37.2 C)   SpO2: 98% 100%    General: Awake, alert and oriented. CV:  Good peripheral perfusion. Regular rate and rhythm. No m/r/g. Resp:  Normal effort. Lungs clear. Abd:  No distention.  Other:  No skin lesions/burn over area of concern. Left arm compartments soft.    ED Results / Procedures / Treatments   Labs (all labs ordered are listed, but only abnormal results are displayed) Labs Reviewed  CBC - Abnormal; Notable for the following components:      Result Value   MCH 25.9 (*)    All other components within normal limits   COMPREHENSIVE METABOLIC PANEL - Abnormal; Notable for the following components:   Glucose, Bld 104 (*)    All other components within normal limits  CK - Abnormal; Notable for the following components:   Total CK 296 (*)    All other components within normal limits     EKG  I, Nance Pear, attending physician, personally viewed and interpreted this EKG  EKG Time: 1529 Rate: 77 Rhythm: normal sinus rhythm  Axis: normal Intervals: qtc 409 QRS: narrow ST changes: no st elevation Impression: normal ekg  RADIOLOGY None   PROCEDURES:  Critical Care performed: No  Procedures   MEDICATIONS ORDERED IN ED: Medications - No data to display   IMPRESSION / MDM / [place] / ED COURSE  I reviewed the triage vital signs and the nursing notes.  Differential diagnosis includes, but is not limited to, electrical burn, arrythmia.  Patient presented to the emergency department today after suffering electric shock when trying to unplug a cord from a wall outlet.  On exam no evidence of burn where she states a electrical injury occurred.  Apartments are soft and then arm.  EKG without concerning arrhythmia.  CK was minimally elevated.  At this time I do not feel patient has significant electrical injury.  Do not feel patient requires inpatient admission at this time. Discussed return precautions with the patient.   FINAL CLINICAL IMPRESSION(S) / ED DIAGNOSES   Final diagnoses:  Electrical shock of hand, initial encounter     Note:  This document was prepared using Dragon voice recognition software and may include unintentional dictation errors.    Nance Pear, MD 02/16/22 1723

## 2022-02-16 NOTE — Discharge Instructions (Signed)
Please seek medical attention for any high fevers, chest pain, shortness of breath, change in behavior, persistent vomiting, bloody stool or any other new or concerning symptoms.  

## 2022-02-16 NOTE — ED Provider Triage Note (Signed)
Emergency Medicine Provider Triage Evaluation Note  Zonnie A Scherer, a 36 y.o. female  was evaluated in triage.  Pt complains of electric shock.  Patient sustained a shock after she was upstairs and was unplugging an empty patient bed to transfer equipment, as she pulled the 3 prong plug from the wall, she experienced immediate shock sensation from her left hand or arm and down to her left foot.  She denies any head injury, syncope, nausea, vomiting, or dizziness.  She reports of now resolved paresthesias to the left upper extremity. She denies any CP, SOB, or weakness  Review of Systems  Positive: Electric shock, LEU paresthesias Negative: CP, SOB  Physical Exam  BP 137/90 (BP Location: Left Arm)   Pulse 79   Temp 98.9 F (37.2 C) (Oral)   Resp 16   Ht 5\' 4"  (1.626 m)   Wt 99.8 kg   SpO2 98%   BMI 37.76 kg/m  Gen:   Awake, no distress  NAD Resp:  Normal effort CTA MSK:   Moves extremities without difficulty  CVS:  RRR  Medical Decision Making  Medically screening exam initiated at 3:36 PM.  Appropriate orders placed.  Ezri A Lipton was informed that the remainder of the evaluation will be completed by another provider, this initial triage assessment does not replace that evaluation, and the importance of remaining in the ED until their evaluation is complete.  Patient to the ED for evaluation of work-related injury which she sustained an electric shock injury while unplugging a bed in a patient room upstairs.   Melvenia Needles, PA-C 02/16/22 1538

## 2022-02-16 NOTE — ED Triage Notes (Addendum)
Pt states that she was here working and touched a frayed cord attached to a bed that was plugged in, pt reports that it hit the palm of her left hand and went up her left arm, through her chest and down her left leg, pt states that she has some pain on her palm, but no outward sign of injury. Pt states that this occurred at 1230

## 2022-02-16 NOTE — ED Notes (Signed)
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 Pt states she had CP about 15 min after incident. States she has a slight pressure in central chest. Denies SOB. Denies LOC. Pt has numbness and paresthesia in L hand/fingers. Pt states when incident happened, her L arm had numbness sensation.   Pt is A&Ox4.

## 2022-02-20 ENCOUNTER — Other Ambulatory Visit: Payer: Self-pay

## 2022-02-20 MED ORDER — VYVANSE 60 MG PO CAPS
ORAL_CAPSULE | ORAL | 0 refills | Status: DC
  Filled 2022-02-20: qty 30, 30d supply, fill #0

## 2022-02-21 ENCOUNTER — Other Ambulatory Visit: Payer: Self-pay

## 2022-02-21 MED ORDER — OLMESARTAN MEDOXOMIL 40 MG PO TABS
40.0000 mg | ORAL_TABLET | Freq: Every day | ORAL | 3 refills | Status: DC
  Filled 2022-02-21: qty 30, 30d supply, fill #0

## 2022-02-21 MED ORDER — OLMESARTAN MEDOXOMIL 40 MG PO TABS
ORAL_TABLET | ORAL | 1 refills | Status: DC
  Filled 2022-02-21 – 2022-03-18 (×2): qty 30, 30d supply, fill #0
  Filled 2022-04-27: qty 30, 30d supply, fill #1
  Filled 2022-06-01: qty 30, 30d supply, fill #2
  Filled 2022-07-06: qty 30, 30d supply, fill #3

## 2022-02-21 MED ORDER — WEGOVY 0.25 MG/0.5ML ~~LOC~~ SOAJ
SUBCUTANEOUS | 0 refills | Status: DC
  Filled 2022-05-11 – 2022-06-28 (×5): qty 2, 28d supply, fill #0

## 2022-02-21 MED ORDER — WEGOVY 1 MG/0.5ML ~~LOC~~ SOAJ
SUBCUTANEOUS | 0 refills | Status: DC
  Filled 2022-04-27: qty 2, 28d supply, fill #0
  Filled 2022-05-11: qty 2, fill #0

## 2022-02-21 MED ORDER — WEGOVY 0.5 MG/0.5ML ~~LOC~~ SOAJ
SUBCUTANEOUS | 0 refills | Status: DC
  Filled 2022-05-11: qty 2, fill #0

## 2022-02-27 ENCOUNTER — Other Ambulatory Visit: Payer: Self-pay | Admitting: Obstetrics and Gynecology

## 2022-02-27 ENCOUNTER — Other Ambulatory Visit: Payer: Self-pay

## 2022-02-27 MED ORDER — SAXENDA 18 MG/3ML ~~LOC~~ SOPN
PEN_INJECTOR | SUBCUTANEOUS | 2 refills | Status: DC
  Filled 2022-02-27 – 2022-03-01 (×2): qty 15, 30d supply, fill #0
  Filled 2022-03-26: qty 15, 30d supply, fill #1

## 2022-03-01 ENCOUNTER — Other Ambulatory Visit: Payer: Self-pay

## 2022-03-01 MED ORDER — UNIFINE PENTIPS 31G X 5 MM MISC
0 refills | Status: DC
  Filled 2022-03-01: qty 100, 100d supply, fill #0

## 2022-03-02 ENCOUNTER — Other Ambulatory Visit: Payer: Self-pay

## 2022-03-05 ENCOUNTER — Other Ambulatory Visit: Payer: Self-pay | Admitting: Obstetrics and Gynecology

## 2022-03-18 ENCOUNTER — Other Ambulatory Visit: Payer: Self-pay

## 2022-03-19 ENCOUNTER — Other Ambulatory Visit: Payer: Self-pay

## 2022-03-19 MED ORDER — VYVANSE 60 MG PO CAPS
ORAL_CAPSULE | ORAL | 0 refills | Status: DC
  Filled 2022-03-19: qty 30, 30d supply, fill #0

## 2022-03-20 ENCOUNTER — Other Ambulatory Visit: Payer: Self-pay

## 2022-03-26 ENCOUNTER — Other Ambulatory Visit: Payer: Self-pay

## 2022-03-27 ENCOUNTER — Encounter
Admission: RE | Admit: 2022-03-27 | Discharge: 2022-03-27 | Disposition: A | Discharge status: Home / Self Care | Payer: 59 | Source: Ambulatory Visit | Attending: Obstetrics and Gynecology | Admitting: Obstetrics and Gynecology

## 2022-03-27 ENCOUNTER — Other Ambulatory Visit: Payer: Self-pay

## 2022-03-27 VITALS — Ht 64.0 in | Wt 215.0 lb

## 2022-03-27 DIAGNOSIS — Z01818 Encounter for other preprocedural examination: Secondary | ICD-10-CM

## 2022-04-04 MED ORDER — CHLORHEXIDINE GLUCONATE 0.12 % MT SOLN: 15.0000 mL | Freq: Once | OROMUCOSAL | Status: AC

## 2022-04-04 MED ORDER — ORAL CARE MOUTH RINSE: 15.0000 mL | Freq: Once | OROMUCOSAL | Status: AC

## 2022-04-04 MED ORDER — LACTATED RINGERS IV SOLN: INTRAVENOUS | Status: DC

## 2022-04-04 MED ORDER — FAMOTIDINE 20 MG PO TABS: 20.0000 mg | ORAL_TABLET | Freq: Once | ORAL | Status: AC

## 2022-04-05 ENCOUNTER — Other Ambulatory Visit: Payer: Self-pay

## 2022-04-05 ENCOUNTER — Encounter
Admission: RE | Disposition: A | Discharge status: Home / Self Care | Payer: Self-pay | Source: Home / Self Care | Attending: Obstetrics and Gynecology

## 2022-04-05 ENCOUNTER — Ambulatory Visit
Admission: RE | Admit: 2022-04-05 | Discharge: 2022-04-05 | Disposition: A | Discharge status: Home / Self Care | Payer: 59 | Attending: Obstetrics and Gynecology | Admitting: Obstetrics and Gynecology

## 2022-04-05 ENCOUNTER — Ambulatory Visit: Payer: 59 | Admitting: Anesthesiology

## 2022-04-05 ENCOUNTER — Encounter: Payer: Self-pay | Admitting: Obstetrics and Gynecology

## 2022-04-05 DIAGNOSIS — Z6836 Body mass index (BMI) 36.0-36.9, adult: Secondary | ICD-10-CM | POA: Diagnosis not present

## 2022-04-05 DIAGNOSIS — E669 Obesity, unspecified: Secondary | ICD-10-CM | POA: Insufficient documentation

## 2022-04-05 DIAGNOSIS — R102 Pelvic and perineal pain: Secondary | ICD-10-CM | POA: Diagnosis not present

## 2022-04-05 DIAGNOSIS — Z01818 Encounter for other preprocedural examination: Secondary | ICD-10-CM

## 2022-04-05 DIAGNOSIS — F909 Attention-deficit hyperactivity disorder, unspecified type: Secondary | ICD-10-CM | POA: Insufficient documentation

## 2022-04-05 DIAGNOSIS — I10 Essential (primary) hypertension: Secondary | ICD-10-CM | POA: Insufficient documentation

## 2022-04-05 DIAGNOSIS — N840 Polyp of corpus uteri: Secondary | ICD-10-CM | POA: Diagnosis present

## 2022-04-05 DIAGNOSIS — Z87891 Personal history of nicotine dependence: Secondary | ICD-10-CM | POA: Insufficient documentation

## 2022-04-05 HISTORY — PX: HYSTEROSCOPY WITH D & C: SHX1775

## 2022-04-05 LAB — POCT PREGNANCY, URINE: Preg Test, Ur: NEGATIVE

## 2022-04-05 SURGERY — DILATATION AND CURETTAGE /HYSTEROSCOPY
Anesthesia: General

## 2022-04-05 MED ORDER — FENTANYL CITRATE (PF) 100 MCG/2ML IJ SOLN
25.0000 ug | INTRAMUSCULAR | Status: DC | PRN
  Administered 2022-04-05 (×2): 25 ug via INTRAVENOUS

## 2022-04-05 MED ORDER — FAMOTIDINE 20 MG PO TABS
ORAL_TABLET | ORAL | Status: AC
  Administered 2022-04-05: 20 mg via ORAL
  Filled 2022-04-05: qty 1

## 2022-04-05 MED ORDER — KETOROLAC TROMETHAMINE 30 MG/ML IJ SOLN
INTRAMUSCULAR | Status: AC
  Filled 2022-04-05: qty 1

## 2022-04-05 MED ORDER — MIDAZOLAM HCL 2 MG/2ML IJ SOLN
INTRAMUSCULAR | Status: AC
  Filled 2022-04-05: qty 2

## 2022-04-05 MED ORDER — SEVOFLURANE IN SOLN
RESPIRATORY_TRACT | Status: AC
  Filled 2022-04-05: qty 250

## 2022-04-05 MED ORDER — 0.9 % SODIUM CHLORIDE (POUR BTL) OPTIME
TOPICAL | Status: DC | PRN
  Administered 2022-04-05: 1140 mL

## 2022-04-05 MED ORDER — ONDANSETRON HCL 4 MG/2ML IJ SOLN
INTRAMUSCULAR | Status: AC
  Filled 2022-04-05: qty 2

## 2022-04-05 MED ORDER — EPHEDRINE 5 MG/ML INJ
INTRAVENOUS | Status: AC
  Filled 2022-04-05: qty 5

## 2022-04-05 MED ORDER — FENTANYL CITRATE (PF) 100 MCG/2ML IJ SOLN
INTRAMUSCULAR | Status: DC | PRN
  Administered 2022-04-05 (×2): 50 ug via INTRAVENOUS

## 2022-04-05 MED ORDER — KETOROLAC TROMETHAMINE 30 MG/ML IJ SOLN
INTRAMUSCULAR | Status: DC | PRN
  Administered 2022-04-05: 30 mg via INTRAVENOUS

## 2022-04-05 MED ORDER — PROPOFOL 10 MG/ML IV BOLUS
INTRAVENOUS | Status: DC | PRN
  Administered 2022-04-05: 150 mg via INTRAVENOUS

## 2022-04-05 MED ORDER — MIDAZOLAM HCL 2 MG/2ML IJ SOLN
INTRAMUSCULAR | Status: DC | PRN
  Administered 2022-04-05: 2 mg via INTRAVENOUS

## 2022-04-05 MED ORDER — DEXMEDETOMIDINE HCL IN NACL 200 MCG/50ML IV SOLN
INTRAVENOUS | Status: DC | PRN
  Administered 2022-04-05: 8 ug via INTRAVENOUS

## 2022-04-05 MED ORDER — FENTANYL CITRATE (PF) 100 MCG/2ML IJ SOLN
INTRAMUSCULAR | Status: AC
  Filled 2022-04-05: qty 2

## 2022-04-05 MED ORDER — CHLORHEXIDINE GLUCONATE 0.12 % MT SOLN
OROMUCOSAL | Status: AC
  Administered 2022-04-05: 15 mL via OROMUCOSAL
  Filled 2022-04-05: qty 15

## 2022-04-05 MED ORDER — DEXAMETHASONE SODIUM PHOSPHATE 10 MG/ML IJ SOLN
INTRAMUSCULAR | Status: DC | PRN
  Administered 2022-04-05: 10 mg via INTRAVENOUS

## 2022-04-05 MED ORDER — PROPOFOL 10 MG/ML IV BOLUS
INTRAVENOUS | Status: AC
  Filled 2022-04-05: qty 20

## 2022-04-05 MED ORDER — EPHEDRINE SULFATE (PRESSORS) 50 MG/ML IJ SOLN
INTRAMUSCULAR | Status: DC | PRN
  Administered 2022-04-05: 10 mg via INTRAVENOUS

## 2022-04-05 MED ORDER — SUCCINYLCHOLINE CHLORIDE 200 MG/10ML IV SOSY
PREFILLED_SYRINGE | INTRAVENOUS | Status: DC | PRN
  Administered 2022-04-05: 100 mg via INTRAVENOUS

## 2022-04-05 MED ORDER — IBUPROFEN 600 MG PO TABS
600.0000 mg | ORAL_TABLET | Freq: Four times a day (QID) | ORAL | 0 refills | Status: DC
  Filled 2022-04-05: qty 30, 8d supply, fill #0

## 2022-04-05 MED ORDER — LIDOCAINE HCL (CARDIAC) PF 100 MG/5ML IV SOSY
PREFILLED_SYRINGE | INTRAVENOUS | Status: DC | PRN
  Administered 2022-04-05: 50 mg via INTRAVENOUS

## 2022-04-05 MED ORDER — DEXAMETHASONE SODIUM PHOSPHATE 10 MG/ML IJ SOLN
INTRAMUSCULAR | Status: AC
  Filled 2022-04-05: qty 1

## 2022-04-05 MED ORDER — ONDANSETRON HCL 4 MG/2ML IJ SOLN
4.0000 mg | Freq: Once | INTRAMUSCULAR | Status: AC | PRN
  Administered 2022-04-05: 4 mg via INTRAVENOUS

## 2022-04-05 MED ORDER — SUCCINYLCHOLINE CHLORIDE 200 MG/10ML IV SOSY
PREFILLED_SYRINGE | INTRAVENOUS | Status: AC
  Filled 2022-04-05: qty 10

## 2022-04-05 SURGICAL SUPPLY — 27 items
BACTOSHIELD CHG 4% 4OZ (MISCELLANEOUS) ×1
BAG URINE DRAIN 2000ML AR STRL (UROLOGICAL SUPPLIES) IMPLANT
CATH FOLEY 2WAY  5CC 16FR (CATHETERS)
CATH URTH 16FR FL 2W BLN LF (CATHETERS) IMPLANT
DEVICE MYOSURE LITE (MISCELLANEOUS) ×1 IMPLANT
DEVICE MYOSURE REACH (MISCELLANEOUS) ×2 IMPLANT
DRSG TELFA 3X8 NADH (GAUZE/BANDAGES/DRESSINGS) IMPLANT
ELECT REM PT RETURN 9FT ADLT (ELECTROSURGICAL) ×2
ELECTRODE REM PT RTRN 9FT ADLT (ELECTROSURGICAL) ×1 IMPLANT
GLOVE BIO SURGEON STRL SZ7 (GLOVE) ×2 IMPLANT
GLOVE SURG UNDER POLY LF SZ7.5 (GLOVE) ×2 IMPLANT
GOWN STRL REUS W/ TWL LRG LVL3 (GOWN DISPOSABLE) ×2 IMPLANT
GOWN STRL REUS W/TWL LRG LVL3 (GOWN DISPOSABLE) ×2
IV NS IRRIG 3000ML ARTHROMATIC (IV SOLUTION) ×2 IMPLANT
KIT PROCEDURE FLUENT (KITS) ×2 IMPLANT
KIT TURNOVER CYSTO (KITS) ×2 IMPLANT
MANIFOLD NEPTUNE II (INSTRUMENTS) ×2 IMPLANT
PACK DNC HYST (MISCELLANEOUS) ×2 IMPLANT
PAD DRESSING TELFA 3X8 NADH (GAUZE/BANDAGES/DRESSINGS) IMPLANT
PAD OB MATERNITY 4.3X12.25 (PERSONAL CARE ITEMS) ×2 IMPLANT
PAD PREP 24X41 OB/GYN DISP (PERSONAL CARE ITEMS) ×2 IMPLANT
SCRUB CHG 4% DYNA-HEX 4OZ (MISCELLANEOUS) ×1 IMPLANT
SEAL ROD LENS SCOPE MYOSURE (ABLATOR) ×2 IMPLANT
SET CYSTO W/LG BORE CLAMP LF (SET/KITS/TRAYS/PACK) IMPLANT
SURGILUBE 2OZ TUBE FLIPTOP (MISCELLANEOUS) ×2 IMPLANT
TUBING CONNECTING 10 (TUBING) ×2 IMPLANT
WATER STERILE IRR 500ML POUR (IV SOLUTION) ×2 IMPLANT

## 2022-04-05 NOTE — Transfer of Care (Signed)
Immediate Anesthesia Transfer of Care Note  Patient: Jessica Espinoza  Procedure(s) Performed: DILATATION AND CURETTAGE /HYSTEROSCOPY, ENDOMETRIAL POLYPECTOMY  Patient Location: PACU  Anesthesia Type:General  Level of Consciousness: drowsy and patient cooperative  Airway & Oxygen Therapy: Patient Spontanous Breathing  Post-op Assessment: Report given to RN and Post -op Vital signs reviewed and stable  Post vital signs: Reviewed and stable  Last Vitals:  Vitals Value Taken Time  BP 114/59 04/05/22 1500  Temp 36.1 C 04/05/22 1449  Pulse 82 04/05/22 1505  Resp 25 04/05/22 1505  SpO2 96 % 04/05/22 1505  Vitals shown include unvalidated device data.  Last Pain:  Vitals:   04/05/22 1501  TempSrc:   PainSc: 7          Complications: No notable events documented.

## 2022-04-05 NOTE — Discharge Instructions (Signed)
AMBULATORY SURGERY  ?DISCHARGE INSTRUCTIONS ? ? ?The drugs that you were given will stay in your system until tomorrow so for the next 24 hours you should not: ? ?Drive an automobile ?Make any legal decisions ?Drink any alcoholic beverage ? ? ?You may resume regular meals tomorrow.  Today it is better to start with liquids and gradually work up to solid foods. ? ?You may eat anything you prefer, but it is better to start with liquids, then soup and crackers, and gradually work up to solid foods. ? ? ?Please notify your doctor immediately if you have any unusual bleeding, trouble breathing, redness and pain at the surgery site, drainage, fever, or pain not relieved by medication. ? ? ? ?Additional Instructions: ? ? ? ?Please contact your physician with any problems or Same Day Surgery at [phone], Monday through Friday 6 am to 4 pm, or [place] [facility] number at [phone].  ?

## 2022-04-05 NOTE — H&P (Signed)
Preoperative History and Physical  Jessica Espinoza is a 36 y.o. [id number] here for surgical management of endometrial polyp and pelvic pain in female.   No significant preoperative concerns.  History of Present Illness: 36 y.o. [phone] female who has a history of her pelvic pain and suspected endometrial polyp. She notes the pain regularly.  The pain may be present a couple of days prior to ovulation up until her period starts and then she has a sharp pain and then the symptoms drop off.  She continues to spot between periods. She has brown or pink spotting, usually dark brown.     She had negative STI testing 10/06/2021 Last pap smear 01/2020: NILM, HPV negative. 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     She had a pelvic ultrasound on 2/2/203 and it showed:  Uterus anteverted Endometrium=5.64m; small amt of fluid within-post menstrual cycle Possible endometrial polyp=1.29 x 0.58 x 1.20cm Rt ovary volume=15.745mLeft ovary appears wnl No free fluid seen   Her periods have been irregular. They used to be every 28-30 days.  Last month her period came a week early, the month before, her period came 4 days early.  Proposed surgery: Hysteroscopy, dilation and curettage, endometrial polypectomy  Past Medical History:  Diagnosis Date   ADHD    BMI 37.0-37.9, adult    Hypertension    Obesity    Past Surgical History:  Procedure Laterality Date   NO PAST SURGERIES     OB History  Gravida Para Term Preterm AB Living  '3 3 3     3  ' SAB IAB Ectopic Multiple Live Births        0 3    # Outcome Date GA Lbr Len/2nd Weight Sex Delivery Anes PTL Lv  3 Term 09/26/15 [id number]:15 / 00:05 3160 g M Vag-Spont Other  LIV  2 Term 09/13/13   3430 g M Vag-Spont  N LIV  1 Term 04/03/09   3175 g F Vag-Spont  N LIV  Patient denies any other pertinent gynecologic issues.   No current facility-administered medications on file prior to encounter.   Current Outpatient Medications on  File Prior to Encounter  Medication Sig Dispense Refill   albuterol (VENTOLIN HFA) 108 (90 Base) MCG/ACT inhaler Inhale 1-2 puffs into the lungs every 6 (six) hours as needed for wheezing or shortness of breath. 18 g 0   aluminum chloride (DRYSOL) 20 % external solution Apply solution sparingly topically once weekly. 60 mL 1   cetirizine (ZYRTEC) 10 MG tablet 10 mg daily.     Cholecalciferol 50 MCG (2000 UT) TABS Take 1 tablet by mouth daily.     dicyclomine (BENTYL) 20 MG tablet Take 20 mg by mouth every 6 (six) hours as needed.     Multiple Vitamin (MULTI-VITAMIN) tablet Take 1 tablet by mouth daily.     sertraline (ZOLOFT) 50 MG tablet Take 1 tablet (50 mg total) by mouth daily. 90 tablet 1   COVID-19 At Home Antigen Test (CARESTART COVID-19 HOME TEST) KIT Use as directed 2 kit 0   EPINEPHrine 0.3 mg/0.3 mL IJ SOAJ injection Inject into the muscle.     fluticasone (FLONASE) 50 MCG/ACT nasal spray Place 1 spray into the nose 2 (two) times daily as needed for rhinitis or allergies.     lisdexamfetamine (VYVANSE) 50 MG capsule Take 1 capsule by mouth daily; may be filled 30 days after date prescribed (Patient not taking: Reported on  04/05/2022) 30 capsule 0   lisdexamfetamine (VYVANSE) 50 MG capsule Take 1 capsule by mouth daily (Patient not taking: Reported on 04/05/2022) 30 capsule 0   lisdexamfetamine (VYVANSE) 50 MG capsule Take 1 capsule by mouth daily; may be filled 60 days after date prescribed (Patient not taking: Reported on 04/05/2022) 30 capsule 0   meloxicam (MOBIC) 15 MG tablet Take 1 tablet (15 mg total) by mouth daily. (Patient not taking: Reported on 04/05/2022) 30 tablet 2   meloxicam (MOBIC) 15 MG tablet TAKE 1 TABLET BY MOUTH DAILY (Patient not taking: Reported on 04/05/2022) 30 tablet 2   olmesartan (BENICAR) 20 MG tablet Take 1 tablet (20 mg total) by mouth once daily (Patient not taking: Reported on 04/05/2022) 30 tablet 1   predniSONE (STERAPRED UNI-PAK 21 TAB) 10 MG (21) TBPK tablet  Take by mouth daily. As directed (Patient not taking: Reported on 04/05/2022) 21 tablet 0   Allergies  Allergen Reactions   Mustard Oil [Allyl Isothiocyanate] Swelling and Rash    Social History:   reports that she quit smoking about 13 years ago. Her smoking use included cigarettes. She has never used smokeless tobacco. She reports that she does not currently use drugs after having used the following drugs: Marijuana. She reports that she does not drink alcohol.  Family History  Problem Relation Age of Onset   CAD Neg Hx    Hypertension Neg Hx    Diabetes Neg Hx     Review of Systems: Noncontributory  PHYSICAL EXAM: Blood pressure 117/78, pulse 82, temperature 97.8 F (36.6 C), temperature source Oral, resp. rate 16, weight 97.5 kg, last menstrual period 03/13/2022, SpO2 99 %. CONSTITUTIONAL: Well-developed, well-nourished female in no acute distress.  HENT:  Normocephalic, atraumatic, External right and left ear normal. Oropharynx is clear and moist EYES: Conjunctivae and EOM are normal. Pupils are equal, round, and reactive to light. No scleral icterus.  NECK: Normal range of motion, supple, no masses SKIN: Skin is warm and dry. No rash noted. Not diaphoretic. No erythema. No pallor. [place]: Alert and oriented to person, place, and time. Normal reflexes, muscle tone coordination. No cranial nerve deficit noted. PSYCHIATRIC: Normal mood and affect. Normal behavior. Normal judgment and thought content. CARDIOVASCULAR: Normal heart rate noted, regular rhythm RESPIRATORY: Effort and breath sounds normal, no problems with respiration noted ABDOMEN: Soft, nontender, nondistended. PELVIC: Deferred MUSCULOSKELETAL: Normal range of motion. No edema and no tenderness. 2+ distal pulses.  Labs: Results for orders placed or performed during the hospital encounter of 04/05/22 (from the past 336 hour(s))  Pregnancy, urine POC   Collection Time: 04/05/22 11:55 AM  Result Value Ref Range    Preg Test, Ur NEGATIVE NEGATIVE    Imaging Studies: No results found.  Assessment: Patient Active Problem List   Diagnosis Date Noted   Endometrial polyp 04/05/2022   Pelvic pain in female 04/05/2022    Plan: Patient will undergo surgical management with the above noted surgery.   The risks of surgery were discussed in detail with the patient including but not limited to: bleeding which may require transfusion or reoperation; infection which may require antibiotics; injury to surrounding organs which may involve bowel, bladder, ureters ; need for additional procedures including laparoscopy or laparotomy; thromboembolic phenomenon, surgical site problems and other postoperative/anesthesia complications. Likelihood of success in alleviating the patient's condition was discussed. Routine postoperative instructions will be reviewed with the patient and her family in detail after surgery.  The patient concurred with the proposed plan, giving  informed written consent for the surgery.  Preoperative prophylactic antibiotics, as indicated, and SCDs ordered on call to the OR.    We have previously discussed that this is a chronic issue that will not resolve without treatment. My recommendation is for surgical removal via hysteroscopy, dilation and curettage, endometrial polypectomy. She agrees to this plan. We discussed that this may not be a cure for her pelvic pain. However, it would likely be a cure for her irregular bleeding and may or may not normalize her periods again. All questions answered.   Prentice Docker, MD 04/05/2022 1:30 PM

## 2022-04-05 NOTE — Anesthesia Preprocedure Evaluation (Signed)
Anesthesia Evaluation  Patient identified by MRN, date of birth, ID band Patient awake    Reviewed: Allergy & Precautions, NPO status , Patient's Chart, lab work & pertinent test results  Airway Mallampati: II  TM Distance: >3 FB Neck ROM: Full    Dental  (+) Teeth Intact   Pulmonary neg pulmonary ROS, Patient abstained from smoking., former smoker,    Pulmonary exam normal breath sounds clear to auscultation       Cardiovascular Exercise Tolerance: Good hypertension, Pt. on medications negative cardio ROS Normal cardiovascular exam Rhythm:Regular     Neuro/Psych negative neurological ROS  negative psych ROS   GI/Hepatic negative GI ROS, Neg liver ROS,   Endo/Other  negative endocrine ROS  Renal/GU negative Renal ROS     Musculoskeletal   Abdominal (+) + obese,   Peds negative pediatric ROS (+)  Hematology negative hematology ROS (+)   Anesthesia Other Findings Past Medical History: No date: ADHD No date: BMI 37.0-37.9, adult No date: Hypertension No date: Obesity  Past Surgical History: No date: NO PAST SURGERIES  BMI    Body Mass Index: 36.90 kg/m      Reproductive/Obstetrics negative OB ROS                             Anesthesia Physical Anesthesia Plan  ASA: 2  Anesthesia Plan: General   Post-op Pain Management:    Induction: Intravenous  PONV Risk Score and Plan: 1 and Ondansetron and Dexamethasone  Airway Management Planned: LMA  Additional Equipment:   Intra-op Plan:   Post-operative Plan: Extubation in OR  Informed Consent: I have reviewed the patients History and Physical, chart, labs and discussed the procedure including the risks, benefits and alternatives for the proposed anesthesia with the patient or authorized representative who has indicated his/her understanding and acceptance.     Dental Advisory Given  Plan Discussed with: CRNA and  Surgeon  Anesthesia Plan Comments:         Anesthesia Quick Evaluation

## 2022-04-05 NOTE — Op Note (Signed)
Operative Note   Date of Service: 04/05/2022  DOB: 1986/02/03  MRN: [id number]   PRE-OP DIAGNOSIS:  1) Endometrial polyp [N84.0] 2) Pelvic pain in female [R10.2]   POST-OP DIAGNOSIS:  1) Endometrial polyp [N84.0] 2) Pelvic pain in female [R10.2]   SURGEON: Surgeon(s) and Role:    Conard Novak, MD - Primary  PROCEDURE: Procedure(s): DILATATION AND CURETTAGE /HYSTEROSCOPY  ANESTHESIA: Choice   ESTIMATED BLOOD LOSS: 20 mL  DRAINS: none   TOTAL IV FLUIDS: 400 mL  SPECIMENS:  Endometrial curettings  VTE PROPHYLAXIS: SCDs to the bilateral lower extremities  ANTIBIOTICS: none indicated and none given  FLUID DEFICIT: minimal  COMPLICATIONS: none  DISPOSITION: PACU - hemodynamically stable.  CONDITION: stable  INDICATION: 36 y.o. [id number] who had a pelvic ultrasound in February concerning for an endometrial polyp. She also had spotting between periods.  She also has a history of pelvic pain, though it is unclear whether the two could be related.   FINDINGS: Exam under anesthesia revealed small, mobile anteverted uterus with no masses and bilateral adnexa without masses or fullness. Hysteroscopy revealed a grossly normal appearing uterine cavity with bilateral tubal ostia and normal appearing endocervical canal. There was an increased thickness in the lower anterior endometrium, though not clearly a polyp.  PROCEDURE IN DETAIL:  After informed consent was obtained, the patient was taken to the operating room where anesthesia was obtained without difficulty. The patient was positioned in the dorsal lithotomy position in [place] stirrups.  The patient's bladder was catheterized with an in and out foley catheter.  The patient was examined under anesthesia, with the above noted findings.  The bi-valved speculum was placed inside the patient's vagina, and the the anterior lip of the cervix was seen and grasped with the tenaculum.  The cervix was progressively dilated to a 7 mm Hegar  dilator.  The hysteroscope was introduced, with the above noted findings. The MyoSure Reach device was utilized to completely flatten the thickened anterior area and gently sample other areas of the uterus.  The hystersocope was removed and the uterine cavity was curetted until a gritty texture was noted, yielding moderate endometrial curettings.  Excellent hemostasis was noted, and all instruments were removed. The tenaculum was removed and hemostasis was obtained at the tenaculum entry sites with the aid of silver nitrate with excellent hemostasis noted throughout.  After verifying that the vagina was free of instruments and sponges, the speculum was removed.  She was then taken out of dorsal lithotomy.  The patient tolerated the procedure well.  Sponge, lap and needle counts were correct x2.  The patient was taken to recovery room in excellent condition.  Thomasene Mohair, MD, [facility] 04/05/2022 3:02 PM

## 2022-04-05 NOTE — Anesthesia Procedure Notes (Signed)
Procedure Name: Intubation Date/Time: 04/05/2022 1:54 PM  Performed by: Jonna Clark, CRNAPre-anesthesia Checklist: Patient identified, Patient being monitored, Timeout performed, Emergency Drugs available and Suction available Patient Re-evaluated:Patient Re-evaluated prior to induction Oxygen Delivery Method: Circle system utilized Preoxygenation: Pre-oxygenation with 100% oxygen Induction Type: IV induction and Rapid sequence Ventilation: Mask ventilation without difficulty Laryngoscope Size: Mac and 3 Grade View: Grade I Tube type: Oral Tube size: 6.5 mm Number of attempts: 1 Placement Confirmation: ETT inserted through vocal cords under direct vision, positive ETCO2 and breath sounds checked- equal and bilateral Secured at: 21 cm Tube secured with: Tape Dental Injury: Teeth and Oropharynx as per pre-operative assessment  Comments: Use of ETT due to pt taking Ozempic....placed OGT after induction

## 2022-04-06 ENCOUNTER — Encounter: Payer: Self-pay | Admitting: Obstetrics and Gynecology

## 2022-04-06 NOTE — Anesthesia Postprocedure Evaluation (Signed)
Anesthesia Post Note  Patient: Jessica Espinoza  Procedure(s) Performed: DILATATION AND CURETTAGE /HYSTEROSCOPY, ENDOMETRIAL POLYPECTOMY  Patient location during evaluation: PACU Anesthesia Type: General Level of consciousness: awake and alert Pain management: satisfactory to patient Vital Signs Assessment: post-procedure vital signs reviewed and stable Respiratory status: nonlabored ventilation Cardiovascular status: stable Anesthetic complications: no   No notable events documented.   Last Vitals:  Vitals:   04/05/22 1545 04/05/22 1553  BP: 110/64 127/84  Pulse: 76 72  Resp: 12 15  Temp:  (!) 36.1 C  SpO2: 100% 100%    Last Pain:  Vitals:   04/05/22 1553  TempSrc: Temporal  PainSc: 1                  VAN STAVEREN,Shellyann Wandrey

## 2022-04-09 LAB — SURGICAL PATHOLOGY

## 2022-04-11 ENCOUNTER — Telehealth: Payer: 59 | Admitting: Physician Assistant

## 2022-04-11 ENCOUNTER — Other Ambulatory Visit: Payer: Self-pay

## 2022-04-11 DIAGNOSIS — N390 Urinary tract infection, site not specified: Secondary | ICD-10-CM

## 2022-04-11 MED ORDER — SULFAMETHOXAZOLE-TRIMETHOPRIM 800-160 MG PO TABS
ORAL_TABLET | ORAL | 0 refills | Status: DC
  Filled 2022-04-11: qty 10, 5d supply, fill #0

## 2022-04-11 NOTE — Progress Notes (Signed)
Because you have recently had a urinary catheter your risk for atypical, resistant bacteria and upper urinary tract/kidney infection are much higher, I feel your condition warrants further evaluation and I recommend that you be seen in a face to face visit. You would require a thorough evaluation and urine culture to rule out atypical/resistant bacteria.   NOTE: There will be NO CHARGE for this eVisit   If you are having a true medical emergency please call 911.      For an urgent face to face visit, [facility] has seven urgent care centers for your convenience:     [facility] at [facility] Directions [phone] [address] [place], [place] [address]    [facility]) Get Driving Directions [phone] [address] [place], [place] [address]  [facility]) Get Driving Directions [phone] [address] [place],  [place]  [address]  [facility] - at [facility] Directions  [phone] [address] [place],  [place] [address]   [facility] at [facility] Get Driving Directions [phone] [address], [address] [place], [place] [address]   [facility] at [facility] Get Driving Directions  [phone] [address] [place], [place] [address]   [facility] at [facility] Directions [phone] [address], [address] [place], [place] [address]  Your MyChart E-visit questionnaire answers were reviewed by a board certified advanced clinical practitioner to complete your personal care plan based on your specific symptoms.  Thank you for using e-Visits.   I provided 5 minutes of non face-to-face time during this encounter for chart review and documentation.

## 2022-04-15 ENCOUNTER — Other Ambulatory Visit: Payer: Self-pay

## 2022-04-16 ENCOUNTER — Other Ambulatory Visit: Payer: Self-pay

## 2022-04-21 ENCOUNTER — Other Ambulatory Visit: Payer: Self-pay

## 2022-04-23 ENCOUNTER — Other Ambulatory Visit: Payer: Self-pay

## 2022-04-23 MED ORDER — VYVANSE 60 MG PO CAPS
ORAL_CAPSULE | ORAL | 0 refills | Status: DC
  Filled 2022-04-23: qty 30, 30d supply, fill #0

## 2022-04-27 ENCOUNTER — Other Ambulatory Visit: Payer: Self-pay

## 2022-04-30 ENCOUNTER — Other Ambulatory Visit: Payer: Self-pay

## 2022-04-30 DIAGNOSIS — E559 Vitamin D deficiency, unspecified: Secondary | ICD-10-CM | POA: Insufficient documentation

## 2022-04-30 DIAGNOSIS — F909 Attention-deficit hyperactivity disorder, unspecified type: Secondary | ICD-10-CM | POA: Insufficient documentation

## 2022-04-30 MED ORDER — SAXENDA 18 MG/3ML ~~LOC~~ SOPN
PEN_INJECTOR | SUBCUTANEOUS | 3 refills | Status: DC
Start: 2022-04-30 — End: 2022-09-20
  Filled 2022-05-11 – 2022-06-01 (×2): qty 15, 30d supply, fill #0
  Filled 2022-07-13: qty 15, 30d supply, fill #1

## 2022-05-11 ENCOUNTER — Other Ambulatory Visit: Payer: Self-pay

## 2022-05-13 ENCOUNTER — Other Ambulatory Visit: Payer: Self-pay

## 2022-05-14 ENCOUNTER — Other Ambulatory Visit: Payer: Self-pay

## 2022-05-15 ENCOUNTER — Encounter: Payer: Self-pay | Admitting: Pharmacist

## 2022-05-15 ENCOUNTER — Other Ambulatory Visit: Payer: Self-pay

## 2022-05-16 ENCOUNTER — Other Ambulatory Visit: Payer: Self-pay

## 2022-05-17 ENCOUNTER — Other Ambulatory Visit: Payer: Self-pay

## 2022-05-20 ENCOUNTER — Other Ambulatory Visit: Payer: Self-pay

## 2022-05-21 ENCOUNTER — Other Ambulatory Visit: Payer: Self-pay

## 2022-05-21 MED ORDER — UNIFINE PENTIPS 31G X 5 MM MISC
0 refills | Status: DC
  Filled 2022-05-21 – 2022-06-01 (×2): qty 100, 90d supply, fill #0

## 2022-06-01 ENCOUNTER — Other Ambulatory Visit: Payer: Self-pay

## 2022-06-01 MED ORDER — VYVANSE 60 MG PO CAPS
ORAL_CAPSULE | ORAL | 0 refills | Status: DC
  Filled 2022-06-01 – 2022-06-15 (×2): qty 30, 30d supply, fill #0

## 2022-06-05 ENCOUNTER — Other Ambulatory Visit: Payer: Self-pay

## 2022-06-08 ENCOUNTER — Other Ambulatory Visit: Payer: Self-pay

## 2022-06-15 ENCOUNTER — Other Ambulatory Visit: Payer: Self-pay

## 2022-06-27 ENCOUNTER — Other Ambulatory Visit: Payer: Self-pay

## 2022-06-28 ENCOUNTER — Other Ambulatory Visit: Payer: Self-pay

## 2022-07-06 ENCOUNTER — Other Ambulatory Visit: Payer: Self-pay

## 2022-07-13 ENCOUNTER — Other Ambulatory Visit: Payer: Self-pay

## 2022-07-17 ENCOUNTER — Other Ambulatory Visit: Payer: Self-pay

## 2022-07-18 ENCOUNTER — Other Ambulatory Visit: Payer: Self-pay

## 2022-07-18 MED ORDER — LISDEXAMFETAMINE DIMESYLATE 60 MG PO CAPS
60.0000 mg | ORAL_CAPSULE | Freq: Every day | ORAL | 0 refills | Status: DC
  Filled 2022-07-18: qty 30, 30d supply, fill #0

## 2022-07-19 ENCOUNTER — Other Ambulatory Visit: Payer: Self-pay

## 2022-07-19 ENCOUNTER — Ambulatory Visit
Admission: EM | Admit: 2022-07-19 | Discharge: 2022-07-19 | Disposition: A | Discharge status: Home / Self Care | Payer: 59

## 2022-07-19 DIAGNOSIS — J019 Acute sinusitis, unspecified: Secondary | ICD-10-CM | POA: Diagnosis not present

## 2022-07-19 DIAGNOSIS — B9689 Other specified bacterial agents as the cause of diseases classified elsewhere: Secondary | ICD-10-CM

## 2022-07-19 MED ORDER — PREDNISONE 10 MG PO TABS
ORAL_TABLET | Freq: Every day | ORAL | 0 refills | Status: DC
  Filled 2022-07-19: qty 21, 6d supply, fill #0

## 2022-07-19 MED ORDER — AMOXICILLIN-POT CLAVULANATE 875-125 MG PO TABS
1.0000 | ORAL_TABLET | Freq: Two times a day (BID) | ORAL | 0 refills | Status: DC
  Filled 2022-07-19: qty 14, 7d supply, fill #0

## 2022-07-19 NOTE — ED Triage Notes (Signed)
Pt.presents to UC w/ c/o nasal congestion and sinus pressure since yesterday. Pt. States she is also having lime green drainage.

## 2022-07-19 NOTE — Discharge Instructions (Addendum)
Follow up here or with your primary care provider if your symptoms are worsening or not improving with treatment.     

## 2022-07-19 NOTE — ED Provider Notes (Signed)
Jessica Espinoza    CSN: [id number] Arrival date & time: 07/19/22  1112      History   Chief Complaint Chief Complaint  Patient presents with   Nasal Congestion   Facial Pain    HPI Jessica Espinoza is a 36 y.o. female.   HPI  Presents to urgent care with complaint of nasal congestion and sinus pressure since yesterday.  Endorses green drainage from her nose.  She states these symptoms are following a upper respiratory infection starting 1-1/2 to 2 weeks ago.  Reports her symptoms improved with current symptoms starting yesterday.  Facial pain on the left side from her ear to her jaw.  Past Medical History:  Diagnosis Date   ADHD    BMI 37.0-37.9, adult    Hypertension    Obesity     Patient Active Problem List   Diagnosis Date Noted   Endometrial polyp 04/05/2022   Pelvic pain in female 04/05/2022   Intractable nausea and vomiting 07/17/2019   Cellulitis 03/21/2017   Routine postpartum follow-up 09/28/2015   Postpartum care and examination of lactating mother 09/28/2015   Postpartum care following vaginal delivery 09/28/2015   Supervision of normal pregnancy in third trimester 09/26/2015   Nausea and vomiting during pregnancy 07/07/2015   Obesity    BMI 37.0-37.9, adult     Past Surgical History:  Procedure Laterality Date   HYSTEROSCOPY WITH D & C N/A 04/05/2022   Procedure: DILATATION AND CURETTAGE /HYSTEROSCOPY, ENDOMETRIAL POLYPECTOMY;  Surgeon: Will Bonnet, MD;  Location: [facility] ORS;  Service: Gynecology;  Laterality: N/A;   NO PAST SURGERIES      OB History     Gravida  3   Para  3   Term  3   Preterm      AB      Living  3      SAB      IAB      Ectopic      Multiple  0   Live Births  3            Home Medications    Prior to Admission medications   Medication Sig Start Date End Date Taking? Authorizing Provider  albuterol (VENTOLIN HFA) 108 (90 Base) MCG/ACT inhaler Inhale 1-2 puffs into the lungs every 6  (six) hours as needed for wheezing or shortness of breath. 08/09/21   Sharion Balloon, NP  aluminum chloride (DRYSOL) 20 % external solution Apply solution sparingly topically once weekly. 01/04/22     cetirizine (ZYRTEC) 10 MG tablet 10 mg daily.    [provider]  Cholecalciferol 50 MCG (2000 UT) TABS Take 1 tablet by mouth daily.    [provider]  COVID-19 At Home Antigen Test [facility] COVID-19 HOME TEST) KIT Use as directed 12/25/21   Letta Median, RPH  dicyclomine (BENTYL) 20 MG tablet Take 20 mg by mouth every 6 (six) hours as needed. 06/14/21   [provider]  EPINEPHrine 0.3 mg/0.3 mL IJ SOAJ injection Inject into the muscle. 06/01/21   [provider]  fluticasone (FLONASE) 50 MCG/ACT nasal spray Place 1 spray into the nose 2 (two) times daily as needed for rhinitis or allergies. 06/30/19 03/27/22  [provider]  ibuprofen (ADVIL) 600 MG tablet Take 1 tablet (600 mg total) by mouth every 6 (six) hours. 04/05/22   Will Bonnet, MD  Insulin Pen Needle (UNIFINE PENTIPS) 31G X 5 MM MISC Use as directed for Saxenda  05/21/22     Liraglutide -Weight Management (SAXENDA) 18 MG/3ML SOPN Inject 3 mg subcutaneously once daily 04/30/22     lisdexamfetamine (VYVANSE) 60 MG capsule Take 1 capsule (60 mg total) by mouth daily. 07/18/22     Multiple Vitamin (MULTI-VITAMIN) tablet Take 1 tablet by mouth daily.    [provider]  olmesartan (BENICAR) 40 MG tablet Take 1 tablet (40 mg total) by mouth once daily 02/21/22     Semaglutide-Weight Management (WEGOVY) 0.25 MG/0.5ML SOAJ Inject 0.5 mLs (0.25 mg total) subcutaneously once a week for 4 doses 02/21/22     Semaglutide-Weight Management (WEGOVY) 0.5 MG/0.5ML SOAJ Inject 0.5 mLs (0.5 mg total) subcutaneously once a week for 4 doses 02/21/22     Semaglutide-Weight Management (WEGOVY) 1 MG/0.5ML SOAJ Inject 0.5 mLs (1 mg total) subcutaneously once a week for 4 doses 02/21/22     sertraline (ZOLOFT) 50 MG  tablet Take 1 tablet (50 mg total) by mouth daily. 01/04/22     sulfamethoxazole-trimethoprim (BACTRIM DS) 800-160 MG tablet Take 1 tablet (160 mg of trimethoprim total) by mouth 2 (two) times daily for 5 days 04/11/22       Family History Family History  Problem Relation Age of Onset   CAD Neg Hx    Hypertension Neg Hx    Diabetes Neg Hx     Social History Social History   Tobacco Use   Smoking status: Former    Types: Cigarettes    Quit date: 05/07/2008    Years since quitting: 14.2   Smokeless tobacco: Never  Vaping Use   Vaping Use: Never used  Substance Use Topics   Alcohol use: No   Drug use: Not Currently    Types: Marijuana     Allergies   Mustard oil [allyl isothiocyanate]   Review of Systems Review of Systems   Physical Exam Triage Vital Signs ED Triage Vitals [07/19/22 1123]  Enc Vitals Group     BP 112/77     Pulse Rate 79     Resp 16     Temp 97.7 F (36.5 C)     Temp src      SpO2 96 %     Weight      Height      Head Circumference      Peak Flow      Pain Score 7     Pain Loc      Pain Edu?      Excl. in [place]?    No data found.  Updated Vital Signs BP 112/77   Pulse 79   Temp 97.7 F (36.5 C)   Resp 16   SpO2 96%   Visual Acuity Right Eye Distance:   Left Eye Distance:   Bilateral Distance:    Right Eye Near:   Left Eye Near:    Bilateral Near:     Physical Exam HENT:     Nose: Congestion present.     Left Sinus: Maxillary sinus tenderness and frontal sinus tenderness present.     Mouth/Throat:     Mouth: Mucous membranes are moist.     Pharynx: No oropharyngeal exudate or posterior oropharyngeal erythema.  Skin:    General: Skin is warm and dry.  Neurological:     General: No focal deficit present.     Mental Status: She is oriented to person, place, and time.  Psychiatric:        Mood and Affect: Mood normal.  Behavior: Behavior normal.      UC Treatments / Results  Labs (all labs ordered are listed, but  only abnormal results are displayed) Labs Reviewed - No data to display  EKG   Radiology No results found.  Procedures Procedures (including critical care time)  Medications Ordered in UC Medications - No data to display  Initial Impression / Assessment and Plan / UC Course  I have reviewed the triage vital signs and the nursing notes.  Pertinent labs & imaging results that were available during my care of the patient were reviewed by me and considered in my medical decision making (see chart for details).  Bacterial sinus infection secondary to past viral URI.  Will treat with both antibiotic and corticosteroid (steroid at patient request).   Final Clinical Impressions(s) / UC Diagnoses   Final diagnoses:  None   Discharge Instructions   None    ED Prescriptions   None    PDMP not reviewed this encounter.   Rose Phi, [place] 07/19/22 1130

## 2022-07-25 ENCOUNTER — Other Ambulatory Visit: Payer: Self-pay

## 2022-07-25 MED ORDER — SERTRALINE HCL 50 MG PO TABS
50.0000 mg | ORAL_TABLET | Freq: Every day | ORAL | 0 refills | Status: DC
  Filled 2022-07-25: qty 30, 30d supply, fill #0
  Filled 2022-08-29: qty 30, 30d supply, fill #1

## 2022-07-25 MED ORDER — OLMESARTAN MEDOXOMIL 40 MG PO TABS
ORAL_TABLET | ORAL | 0 refills | Status: DC
  Filled 2022-07-25: qty 30, 30d supply, fill #0

## 2022-07-26 ENCOUNTER — Other Ambulatory Visit: Payer: Self-pay

## 2022-07-30 ENCOUNTER — Other Ambulatory Visit: Payer: Self-pay

## 2022-08-03 ENCOUNTER — Ambulatory Visit
Admission: EM | Admit: 2022-08-03 | Discharge: 2022-08-03 | Disposition: A | Discharge status: Home / Self Care | Payer: 59 | Attending: Emergency Medicine | Admitting: Emergency Medicine

## 2022-08-03 ENCOUNTER — Other Ambulatory Visit: Payer: Self-pay

## 2022-08-03 DIAGNOSIS — J329 Chronic sinusitis, unspecified: Secondary | ICD-10-CM | POA: Diagnosis not present

## 2022-08-03 DIAGNOSIS — J209 Acute bronchitis, unspecified: Secondary | ICD-10-CM

## 2022-08-03 MED ORDER — BENZONATATE 100 MG PO CAPS
100.0000 mg | ORAL_CAPSULE | Freq: Three times a day (TID) | ORAL | 0 refills | Status: DC | PRN
  Filled 2022-08-03: qty 21, 7d supply, fill #0

## 2022-08-03 MED ORDER — PREDNISONE 10 MG PO TABS
ORAL_TABLET | Freq: Every day | ORAL | 0 refills | Status: DC
  Filled 2022-08-03: qty 21, 6d supply, fill #0

## 2022-08-03 MED ORDER — ALBUTEROL SULFATE HFA 108 (90 BASE) MCG/ACT IN AERS
1.0000 | INHALATION_SPRAY | Freq: Four times a day (QID) | RESPIRATORY_TRACT | 0 refills | Status: AC | PRN
  Filled 2022-08-03: qty 6.7, 30d supply, fill #0

## 2022-08-03 MED ORDER — AZITHROMYCIN 250 MG PO TABS
250.0000 mg | ORAL_TABLET | Freq: Every day | ORAL | 0 refills | Status: DC
  Filled 2022-08-03: qty 6, 5d supply, fill #0

## 2022-08-03 NOTE — ED Triage Notes (Signed)
Patient to Urgent Care with complaints of sinus pressure and bilateral ear congestion, right sided ear pressure. Reports a lot of post nasal drip. Symptoms started yesterday.   Cough today- productive with clear/ yellow tinged mucus.  Recently treated for sinus infection- reports no symptoms for approx a week.  Denies any known fevers.  Has been taking mucinex cold.

## 2022-08-03 NOTE — Discharge Instructions (Addendum)
Take the Tessalon Perles, prednisone, Zithromax, and albuterol inhaler as directed.  Follow up with your primary care provider on Monday.

## 2022-08-03 NOTE — ED Provider Notes (Signed)
[name]    CSN: [id number] Arrival date & time: 08/03/22  1512      History   Chief Complaint Chief Complaint  Patient presents with   Facial Pain   Otalgia    HPI Jessica Espinoza is a 36 y.o. female.  Patient presents with recurrent congestion and cough x 3 weeks; worse x 1 day.  Her symptoms had improved after taking antibiotic and prednisone but then returned yesterday.  She also reports ear pain and sinus pressure.  No fever, rash, sore throat, wheezing, shortness of breath, vomiting, diarrhea, or other symptoms.  Treatment at home with Mucinex.  Patient was seen at this urgent care on 07/19/2022; diagnosed with acute sinusitis, treated with Augmentin and prednisone.    The history is provided by the patient and medical records.    Past Medical History:  Diagnosis Date   ADHD    BMI 37.0-37.9, adult    Hypertension    Obesity     Patient Active Problem List   Diagnosis Date Noted   Adult ADHD 04/30/2022   Vitamin D deficiency 04/30/2022   Endometrial polyp 04/05/2022   Pelvic pain in female 04/05/2022   Essential hypertension 08/09/2020   Intractable nausea and vomiting 07/17/2019   Generalized anxiety disorder 07/16/2017   Irritable bowel syndrome 07/16/2017   Cellulitis 03/21/2017   Mild depression 03/07/2016   Routine postpartum follow-up 09/28/2015   Postpartum care and examination of lactating mother 09/28/2015   Postpartum care following vaginal delivery 09/28/2015   Supervision of normal pregnancy in third trimester 09/26/2015   Nausea and vomiting during pregnancy 07/07/2015   Obesity    BMI 37.0-37.9, adult     Past Surgical History:  Procedure Laterality Date   HYSTEROSCOPY WITH D & C N/A 04/05/2022   Procedure: DILATATION AND CURETTAGE /HYSTEROSCOPY, ENDOMETRIAL POLYPECTOMY;  Surgeon: Will Bonnet, MD;  Location: [facility] ORS;  Service: Gynecology;  Laterality: N/A;   NO PAST SURGERIES      OB History     Gravida  3   Para   3   Term  3   Preterm      AB      Living  3      SAB      IAB      Ectopic      Multiple  0   Live Births  3            Home Medications    Prior to Admission medications   Medication Sig Start Date End Date Taking? Authorizing Provider  albuterol (VENTOLIN HFA) 108 (90 Base) MCG/ACT inhaler Inhale 1-2 puffs into the lungs every 6 (six) hours as needed. 08/03/22  Yes Sharion Balloon, NP  azithromycin (ZITHROMAX) 250 MG tablet Take 2 tablets by mouth daily on day 1, then 1 tablet daily on days 2-5. 08/03/22  Yes Sharion Balloon, NP  benzonatate (TESSALON) 100 MG capsule Take 1 capsule (100 mg total) by mouth 3 (three) times daily as needed for cough. 08/03/22  Yes Sharion Balloon, NP  predniSONE (DELTASONE) 10 MG tablet Take 6 tabs on day 1, 5 tabs on day 2, 4 tabs on day 3, 3 tabs on day 4, 2 tabs on day 5, 1 tab on day 6. Then stop. 08/03/22  Yes Sharion Balloon, NP  aluminum chloride (DRYSOL) 20 % external solution Apply solution sparingly topically once weekly. 01/04/22     cetirizine (ZYRTEC) 10 MG tablet 10 mg  daily.    [provider]  Cholecalciferol 50 MCG (2000 UT) TABS Take 1 tablet by mouth daily.    [provider]  COVID-19 At Home Antigen Test [facility] COVID-19 HOME TEST) KIT Use as directed 12/25/21   Letta Median, RPH  dicyclomine (BENTYL) 20 MG tablet Take 20 mg by mouth every 6 (six) hours as needed. 06/14/21   [provider]  EPINEPHrine 0.3 mg/0.3 mL IJ SOAJ injection Inject into the muscle. 06/01/21   [provider]  EPINEPHrine 0.3 mg/0.3 mL IJ SOAJ injection INJECT 0.3MG INTO THE MUSCLE ONCE FOR 1 DOSE AS NEEDED 06/01/21   [provider]  fluticasone (FLONASE) 50 MCG/ACT nasal spray Place 1 spray into the nose 2 (two) times daily as needed for rhinitis or allergies. 06/30/19 03/27/22  [provider]  ibuprofen (ADVIL) 600 MG tablet Take 1 tablet (600 mg total) by mouth every 6 (six) hours. 04/05/22   Will Bonnet, MD  Insulin Pen Needle (UNIFINE PENTIPS) 31G X 5 MM MISC Use as directed for Saxenda 05/21/22     Liraglutide -Weight Management (SAXENDA) 18 MG/3ML SOPN Inject 3 mg subcutaneously once daily 04/30/22     lisdexamfetamine (VYVANSE) 60 MG capsule Take 1 capsule (60 mg total) by mouth daily. 07/18/22     Multiple Vitamin (MULTI-VITAMIN) tablet Take 1 tablet by mouth daily.    [provider]  olmesartan (BENICAR) 40 MG tablet Take 1 tablet (40 mg total) by mouth once daily 02/21/22     olmesartan (BENICAR) 40 MG tablet Take 1 tablet (40 mg total) by mouth once daily 07/25/22     Semaglutide-Weight Management (WEGOVY) 0.25 MG/0.5ML SOAJ Inject 0.5 mLs (0.25 mg total) subcutaneously once a week for 4 doses 02/21/22     Semaglutide-Weight Management (WEGOVY) 0.5 MG/0.5ML SOAJ Inject 0.5 mLs (0.5 mg total) subcutaneously once a week for 4 doses 02/21/22     Semaglutide-Weight Management (WEGOVY) 1 MG/0.5ML SOAJ Inject 0.5 mLs (1 mg total) subcutaneously once a week for 4 doses 02/21/22     sertraline (ZOLOFT) 50 MG tablet Take 1 tablet (50 mg total) by mouth daily. 07/25/22       Family History Family History  Problem Relation Age of Onset   CAD Neg Hx    Hypertension Neg Hx    Diabetes Neg Hx     Social History Social History   Tobacco Use   Smoking status: Former    Types: Cigarettes    Quit date: 05/07/2008    Years since quitting: 14.2   Smokeless tobacco: Never  Vaping Use   Vaping Use: Never used  Substance Use Topics   Alcohol use: No   Drug use: Not Currently    Types: Marijuana     Allergies   Mustard oil [allyl isothiocyanate]   Review of Systems Review of Systems  Constitutional:  Negative for chills and fever.  HENT:  Positive for congestion, ear pain, postnasal drip, rhinorrhea and sinus pressure. Negative for sore throat.   Respiratory:  Positive for cough. Negative for shortness of breath.   Cardiovascular:  Negative for chest pain and  palpitations.  Gastrointestinal:  Negative for diarrhea and vomiting.  Skin:  Negative for rash.  All other systems reviewed and are negative.    Physical Exam Triage Vital Signs ED Triage Vitals [08/03/22 1547]  Enc Vitals Group     BP      Pulse Rate 99     Resp 18  Temp 98.1 F (36.7 C)     Temp src      SpO2 97 %     Weight      Height      Head Circumference      Peak Flow      Pain Score      Pain Loc      Pain Edu?      Excl. in [place]?    No data found.  Updated Vital Signs BP 137/85 Comment: Forgot to take BP meds with am  Pulse 99   Temp 98.1 F (36.7 C)   Resp 18   Ht _0  (1.626 m)   Wt 215 lb (97.5 kg)   LMP 07/27/2022   SpO2 97%   BMI 36.90 kg/m   Visual Acuity Right Eye Distance:   Left Eye Distance:   Bilateral Distance:    Right Eye Near:   Left Eye Near:    Bilateral Near:     Physical Exam Vitals and nursing note reviewed.  Constitutional:      General: She is not in acute distress.    Appearance: She is well-developed. She is not ill-appearing.  HENT:     Right Ear: Tympanic membrane normal.     Left Ear: Tympanic membrane normal.     Nose: Congestion and rhinorrhea present.     Mouth/Throat:     Mouth: Mucous membranes are moist.     Pharynx: Oropharynx is clear.  Cardiovascular:     Rate and Rhythm: Normal rate and regular rhythm.     Heart sounds: Normal heart sounds.  Pulmonary:     Effort: Pulmonary effort is normal. No respiratory distress.     Breath sounds: Normal breath sounds.  Musculoskeletal:     Cervical back: Neck supple.  Skin:    General: Skin is warm and dry.  Neurological:     Mental Status: She is alert.  Psychiatric:        Mood and Affect: Mood normal.        Behavior: Behavior normal.      UC Treatments / Results  Labs (all labs ordered are listed, but only abnormal results are displayed) Labs Reviewed - No data to display  EKG   Radiology No results found.  Procedures Procedures  (including critical care time)  Medications Ordered in UC Medications - No data to display  Initial Impression / Assessment and Plan / UC Course  I have reviewed the triage vital signs and the nursing notes.  Pertinent labs & imaging results that were available during my care of the patient were reviewed by me and considered in my medical decision making (see chart for details).   Recurrent sinusitis, bronchitis.  Patient had improved on Augmentin and prednisone but then her symptoms returned yesterday.  Treating today with refill of albuterol inhaler, prednisone, Zithromax, Tessalon Perles.  Instructed patient to follow-up with her PCP on Monday.  Education provided on sinusitis and bronchitis.  She agrees to plan of care.   Final Clinical Impressions(s) / UC Diagnoses   Final diagnoses:  Recurrent sinusitis  Acute bronchitis, unspecified organism     Discharge Instructions      Take the Tessalon Perles, prednisone, Zithromax, and albuterol inhaler as directed.  Follow up with your primary care provider on Monday.        ED Prescriptions     Medication Sig Dispense Auth. Provider   albuterol (VENTOLIN HFA) 108 (90 Base) MCG/ACT inhaler Inhale 1-2  puffs into the lungs every 6 (six) hours as needed. 6.7 g Sharion Balloon, NP   azithromycin (ZITHROMAX) 250 MG tablet Take 2 tablets by mouth daily on day 1, then 1 tablet daily on days 2-5. 6 tablet Sharion Balloon, NP   predniSONE (DELTASONE) 10 MG tablet Take 6 tabs on day 1, 5 tabs on day 2, 4 tabs on day 3, 3 tabs on day 4, 2 tabs on day 5, 1 tab on day 6. Then stop. 21 tablet Sharion Balloon, NP   benzonatate (TESSALON) 100 MG capsule Take 1 capsule (100 mg total) by mouth 3 (three) times daily as needed for cough. 21 capsule Sharion Balloon, NP      PDMP not reviewed this encounter.   Sharion Balloon, NP 08/03/22 [phone]

## 2022-08-16 ENCOUNTER — Other Ambulatory Visit: Payer: Self-pay

## 2022-08-16 MED ORDER — LISDEXAMFETAMINE DIMESYLATE 60 MG PO CAPS
60.0000 mg | ORAL_CAPSULE | Freq: Every day | ORAL | 0 refills | Status: DC
  Filled 2022-08-16 – 2022-08-29 (×2): qty 30, 30d supply, fill #0

## 2022-08-28 ENCOUNTER — Other Ambulatory Visit: Payer: Self-pay

## 2022-08-29 ENCOUNTER — Other Ambulatory Visit: Payer: Self-pay

## 2022-09-20 ENCOUNTER — Encounter: Payer: Self-pay | Admitting: Nurse Practitioner

## 2022-09-20 ENCOUNTER — Other Ambulatory Visit: Payer: Self-pay

## 2022-09-20 ENCOUNTER — Ambulatory Visit (INDEPENDENT_AMBULATORY_CARE_PROVIDER_SITE_OTHER): Payer: 59 | Admitting: Nurse Practitioner

## 2022-09-20 VITALS — BP 130/78 | HR 79 | Temp 97.9°F | Resp 18 | Wt 224.4 lb

## 2022-09-20 DIAGNOSIS — G8929 Other chronic pain: Secondary | ICD-10-CM

## 2022-09-20 DIAGNOSIS — Z6838 Body mass index (BMI) 38.0-38.9, adult: Secondary | ICD-10-CM

## 2022-09-20 DIAGNOSIS — F331 Major depressive disorder, recurrent, moderate: Secondary | ICD-10-CM | POA: Diagnosis not present

## 2022-09-20 DIAGNOSIS — K580 Irritable bowel syndrome with diarrhea: Secondary | ICD-10-CM

## 2022-09-20 DIAGNOSIS — F411 Generalized anxiety disorder: Secondary | ICD-10-CM

## 2022-09-20 DIAGNOSIS — I10 Essential (primary) hypertension: Secondary | ICD-10-CM

## 2022-09-20 DIAGNOSIS — F909 Attention-deficit hyperactivity disorder, unspecified type: Secondary | ICD-10-CM

## 2022-09-20 DIAGNOSIS — M25511 Pain in right shoulder: Secondary | ICD-10-CM

## 2022-09-20 MED ORDER — OLMESARTAN MEDOXOMIL 40 MG PO TABS
ORAL_TABLET | ORAL | 1 refills | Status: DC
  Filled 2022-09-20: qty 30, 30d supply, fill #0
  Filled 2022-10-20: qty 30, 30d supply, fill #1
  Filled 2022-11-16: qty 30, 30d supply, fill #2
  Filled 2023-01-18: qty 30, 30d supply, fill #3
  Filled 2023-03-06 (×2): qty 30, 30d supply, fill #4
  Filled 2023-04-26: qty 30, 30d supply, fill #5

## 2022-09-20 MED ORDER — SERTRALINE HCL 100 MG PO TABS
100.0000 mg | ORAL_TABLET | Freq: Every day | ORAL | 0 refills | Status: DC
  Filled 2022-09-20: qty 30, 30d supply, fill #0

## 2022-09-20 MED ORDER — DICYCLOMINE HCL 20 MG PO TABS
20.0000 mg | ORAL_TABLET | Freq: Four times a day (QID) | ORAL | 1 refills | Status: AC | PRN
  Filled 2022-09-20: qty 30, 8d supply, fill #0
  Filled 2023-06-18: qty 30, 8d supply, fill #1

## 2022-09-20 NOTE — Assessment & Plan Note (Signed)
Continue to see [facility].  Continue taking vyvanse 60 mg daily

## 2022-09-20 NOTE — Assessment & Plan Note (Signed)
Increasing zoloft to 100 mg daily, follow up in 4 weeks 

## 2022-09-20 NOTE — Assessment & Plan Note (Signed)
Refill of bentyl sent in . No changes in treatment plan

## 2022-09-20 NOTE — Progress Notes (Signed)
BP 130/78   Pulse 79   Temp 97.9 F (36.6 C) (Oral)   Resp 18   Wt 224 lb 6.4 oz (101.8 kg)   LMP 09/20/2022   SpO2 98%   BMI 38.52 kg/m    Subjective:    Patient ID: Jessica Espinoza, female    DOB: 1986-08-01, 36 y.o.   MRN: [id number]  HPI: Jessica Espinoza is a 36 y.o. female  Chief Complaint  Patient presents with   Establish Care   Hypertension   Obesity    Trying to get approved for Bariatric Surgery   Depression   Shoulder Pain    right   Establish care: her last physical was this past summer.  Pap 2021.  Medical history includes htn, ADHD, depression, anxiety, endometrial polyp, prediabetic, obesity.  Family history includes bipolar, schizophrenia, DM, lung ca, intestinal ca, hearing loss, depression and anxiety .  HTN: her blood pressure today is 130/78.  She is currently taking olmesartan 40 mg daily. She denies any chest pain, shortness of breath, headaches or blurred vision.   IBS:she says that her IBS has not been bad lately.  She says that she does have flares but says the zoloft has helped.  She says that she uses bentyl for flares.   Obesity: Her weight today is 224 lbs with a BMI of 38.52.  She is working on getting approved for bariatric surgery. She was previously on saxenda and wegovy but unable to get it.   Right shoulder pain:she says she has been having right shoulder pain for several years, she says she has had to take a muscle relaxer for some relief.  She says she occasionally goes to the chiropractor.  She reports that she has been on robaxin.  She says she has not taken it in months.    Depression/anxiety/ADHD: she is currently taking vyvanse 60 mg daily for her ADHD.  She is established with [place] [facility] for her ADHD.    she is currently taking zoloft 50 mg daily for her anxiety and depression. She says she feels like she needs to increase her dose.      09/20/2022    1:11 PM  Depression screen PHQ 2/9  Decreased Interest 3   Down, Depressed, Hopeless 2  PHQ - 2 Score 5  Altered sleeping 3  Tired, decreased energy 3  Change in appetite 3  Feeling bad or failure about yourself  3  Trouble concentrating 1  Moving slowly or fidgety/restless 1  Suicidal thoughts 0  PHQ-9 Score 19  Difficult doing work/chores Somewhat difficult       09/20/2022    1:12 PM  GAD 7 : Generalized Anxiety Score  Nervous, Anxious, on Edge 3  Control/stop worrying 3  Worry too much - different things 3  Trouble relaxing 2  Restless 1  Easily annoyed or irritable 3  Afraid - awful might happen 3  Total GAD 7 Score 18  Anxiety Difficulty Somewhat difficult      Relevant past medical, surgical, family and social history reviewed and updated as indicated. Interim medical history since our last visit reviewed. Allergies and medications reviewed and updated.  Review of Systems  Constitutional: Negative for fever or weight change.  Respiratory: Negative for cough and shortness of breath.   Cardiovascular: Negative for chest pain or palpitations.  Gastrointestinal: Negative for abdominal pain, no bowel changes.  Musculoskeletal: Negative for gait problem or joint swelling.  Skin: Negative for rash.  Neurological:  Negative for dizziness or headache.  No other specific complaints in a complete review of systems (except as listed in HPI above).      Objective:    BP 130/78   Pulse 79   Temp 97.9 F (36.6 C) (Oral)   Resp 18   Wt 224 lb 6.4 oz (101.8 kg)   LMP 09/20/2022   SpO2 98%   BMI 38.52 kg/m   Wt Readings from Last 3 Encounters:  09/20/22 224 lb 6.4 oz (101.8 kg)  08/03/22 215 lb (97.5 kg)  04/05/22 214 lb 15.2 oz (97.5 kg)    Physical Exam  Constitutional: Patient appears well-developed and well-nourished. Obese  No distress.  HEENT: head atraumatic, normocephalic, pupils equal and reactive to light, neck supple Cardiovascular: Normal rate, regular rhythm and normal heart sounds.  No murmur heard. No BLE  edema. Pulmonary/Chest: Effort normal and breath sounds normal. No respiratory distress. Abdominal: Soft.  There is no tenderness. Psychiatric: Patient has a normal mood and affect. behavior is normal. Judgment and thought content normal.      Assessment & Plan:   Problem List Items Addressed This Visit       Cardiovascular and Mediastinum   Essential hypertension - Primary    Continue taking olmesartan 40 mg daily.       Relevant Medications   olmesartan (BENICAR) 40 MG tablet     Digestive   Irritable bowel syndrome    Refill of bentyl sent in . No changes in treatment plan      Relevant Medications   dicyclomine (BENTYL) 20 MG tablet     Other   Obesity    Patient is working on getting approved for bariatric surgery      Adult ADHD    Continue to see [place] [facility].  Continue taking vyvanse 60 mg daily      Generalized anxiety disorder    Increasing zoloft to 100 mg daily, follow up in 4 weeks      Relevant Medications   sertraline (ZOLOFT) 100 MG tablet   Moderate episode of recurrent major depressive disorder (HCC)    Increasing zoloft to 100 mg daily, follow up in 4 weeks      Relevant Medications   sertraline (ZOLOFT) 100 MG tablet   Chronic right shoulder pain    Continue using robaxin as needed, recommend NSAID for pain      Relevant Medications   sertraline (ZOLOFT) 100 MG tablet     Follow up plan: Return in about 4 weeks (around 10/18/2022) for follow up.

## 2022-09-20 NOTE — Assessment & Plan Note (Signed)
Increasing zoloft to 100 mg daily, follow up in 4 weeks

## 2022-09-20 NOTE — Assessment & Plan Note (Deleted)
Patient is working on getting approved for bariatric surgery

## 2022-09-20 NOTE — Assessment & Plan Note (Signed)
Patient is working on getting approved for bariatric surgery 

## 2022-09-20 NOTE — Assessment & Plan Note (Signed)
Continue taking olmesartan 40 mg daily 

## 2022-09-20 NOTE — Assessment & Plan Note (Signed)
Continue using robaxin as needed, recommend NSAID for pain

## 2022-09-21 ENCOUNTER — Other Ambulatory Visit: Payer: Self-pay

## 2022-09-28 ENCOUNTER — Other Ambulatory Visit: Payer: Self-pay | Admitting: Nurse Practitioner

## 2022-09-28 ENCOUNTER — Other Ambulatory Visit: Payer: Self-pay

## 2022-09-28 ENCOUNTER — Encounter: Payer: Self-pay | Admitting: Nurse Practitioner

## 2022-09-28 DIAGNOSIS — G8929 Other chronic pain: Secondary | ICD-10-CM

## 2022-09-28 MED ORDER — METHOCARBAMOL 500 MG PO TABS
500.0000 mg | ORAL_TABLET | Freq: Two times a day (BID) | ORAL | 0 refills | Status: DC | PRN
  Filled 2022-09-28 (×2): qty 20, 10d supply, fill #0

## 2022-09-28 MED ORDER — METHOCARBAMOL 500 MG PO TABS: 500.0000 mg | ORAL_TABLET | Freq: Two times a day (BID) | ORAL | 0 refills | Status: DC | PRN

## 2022-10-17 NOTE — Progress Notes (Signed)
BP 126/82   Pulse 97   Temp 98.3 F (36.8 C) (Oral)   Resp 18   Ht 5\' 4"  (1.626 m)   Wt 222 lb 6.4 oz (100.9 kg)   LMP 09/20/2022   SpO2 98%   BMI 38.17 kg/m    Subjective:    Patient ID: Jessica Espinoza, female    DOB: 07-07-1986, 37 y.o.   MRN: [id number]  HPI: Dorenda A Kant is a 37 y.o. female  Chief Complaint  Patient presents with   Hypertension   Depression   Obesity    1 month follow up    Depression/anxiety: last visit increased her zoloft to 100 mg daily. Her PHQ9 and GAD scores only slightly improved.  Today patient reports she noticed a difference with the depression, she says that her anxiety has gotten worse.  She says she does not feel like the zoloft is helping her anxiety. She says that she worries a lot. She says that she gets angry and frustrated. Discussed starting Abilify and she is in agreement with plan. Also discussed going to psychiatry.   She says that she would like to see psychiatry.      10/18/2022    9:58 AM 09/20/2022    1:11 PM  Depression screen PHQ 2/9  Decreased Interest 2 3  Down, Depressed, Hopeless 2 2  PHQ - 2 Score 4 5  Altered sleeping 3 3  Tired, decreased energy 3 3  Change in appetite 3 3  Feeling bad or failure about yourself  1 3  Trouble concentrating 2 1  Moving slowly or fidgety/restless 1 1  Suicidal thoughts 0 0  PHQ-9 Score 17 19  Difficult doing work/chores Very difficult Somewhat difficult       10/18/2022   10:01 AM 09/20/2022    1:12 PM  GAD 7 : Generalized Anxiety Score  Nervous, Anxious, on Edge 3 3  Control/stop worrying 3 3  Worry too much - different things 3 3  Trouble relaxing 1 2  Restless 0 1  Easily annoyed or irritable 3 3  Afraid - awful might happen 3 3  Total GAD 7 Score 16 18  Anxiety Difficulty Very difficult Somewhat difficult     HTN: her blood pressure today is 126/82.  She is currently on olmesartan 40 mg daily. She denies any chest pain, shortness of breath, headaches or  blurred vision.  Obesity: her weight today is 222 lbs with a BMI of 38.17.she is working on getting approved for bariatric surgery.   Relevant past medical, surgical, family and social history reviewed and updated as indicated. Interim medical history since our last visit reviewed. Allergies and medications reviewed and updated.  Review of Systems  Constitutional: Negative for fever or weight change.  Respiratory: Negative for cough and shortness of breath.   Cardiovascular: Negative for chest pain or palpitations.  Gastrointestinal: Negative for abdominal pain, no bowel changes.  Musculoskeletal: Negative for gait problem or joint swelling.  Skin: Negative for rash.  Neurological: Negative for dizziness or headache.  No other specific complaints in a complete review of systems (except as listed in HPI above).      Objective:    BP 126/82   Pulse 97   Temp 98.3 F (36.8 C) (Oral)   Resp 18   Ht 5\' 4"  (1.626 m)   Wt 222 lb 6.4 oz (100.9 kg)   LMP 09/20/2022   SpO2 98%   BMI 38.17 kg/m   Wt Readings  from Last 3 Encounters:  10/18/22 222 lb 6.4 oz (100.9 kg)  09/20/22 224 lb 6.4 oz (101.8 kg)  08/03/22 215 lb (97.5 kg)    Physical Exam  Constitutional: Patient appears well-developed and well-nourished. Obese  No distress.  HEENT: head atraumatic, normocephalic, pupils equal and reactive to light, neck supple Cardiovascular: Normal rate, regular rhythm and normal heart sounds.  No murmur heard. No BLE edema. Pulmonary/Chest: Effort normal and breath sounds normal. No respiratory distress. Abdominal: Soft.  There is no tenderness. Psychiatric: Patient has a normal mood and affect. behavior is normal. Judgment and thought content normal.      Assessment & Plan:   Problem List Items Addressed This Visit       Cardiovascular and Mediastinum   Essential hypertension    Continue taking olmesartan 40 mg daily        Other   Obesity    Working on getting bariatric  surgery      Generalized anxiety disorder    Referral placed to psychiatry, continue zoloft 100 mg, start abilify 2 mg daily      Relevant Medications   ARIPiprazole (ABILIFY) 2 MG tablet   Other Relevant Orders   Ambulatory referral to Psychiatry   Moderate episode of recurrent major depressive disorder ([place]) - Primary    Referral placed to psychiatry, continue zoloft 100 mg, start abilify 2 mg daily      Relevant Medications   ARIPiprazole (ABILIFY) 2 MG tablet   Other Relevant Orders   Ambulatory referral to Psychiatry   Other Visit Diagnoses     Screening for cholesterol level       Relevant Orders   Lipid panel   Screening for diabetes mellitus       Relevant Orders   COMPLETE METABOLIC PANEL WITH GFR   Hemoglobin A1c   Encounter for hepatitis C screening test for low risk patient       Relevant Orders   Hepatitis C antibody   Screening for deficiency anemia       Relevant Orders   CBC with Differential/Platelet        Follow up plan: Return in about 4 weeks (around 11/15/2022) for follow up.

## 2022-10-18 ENCOUNTER — Other Ambulatory Visit: Payer: Self-pay

## 2022-10-18 ENCOUNTER — Ambulatory Visit (INDEPENDENT_AMBULATORY_CARE_PROVIDER_SITE_OTHER): Payer: 59 | Admitting: Nurse Practitioner

## 2022-10-18 ENCOUNTER — Encounter: Payer: Self-pay | Admitting: Nurse Practitioner

## 2022-10-18 VITALS — BP 126/82 | HR 97 | Temp 98.3°F | Resp 18 | Ht 64.0 in | Wt 222.4 lb

## 2022-10-18 DIAGNOSIS — Z1159 Encounter for screening for other viral diseases: Secondary | ICD-10-CM

## 2022-10-18 DIAGNOSIS — Z6838 Body mass index (BMI) 38.0-38.9, adult: Secondary | ICD-10-CM | POA: Diagnosis not present

## 2022-10-18 DIAGNOSIS — F331 Major depressive disorder, recurrent, moderate: Secondary | ICD-10-CM

## 2022-10-18 DIAGNOSIS — F411 Generalized anxiety disorder: Secondary | ICD-10-CM

## 2022-10-18 DIAGNOSIS — I10 Essential (primary) hypertension: Secondary | ICD-10-CM

## 2022-10-18 DIAGNOSIS — Z13220 Encounter for screening for lipoid disorders: Secondary | ICD-10-CM | POA: Diagnosis not present

## 2022-10-18 DIAGNOSIS — Z130 Encounter for screening for diseases of the blood and blood-forming organs and certain disorders involving the immune mechanism: Secondary | ICD-10-CM

## 2022-10-18 DIAGNOSIS — Z131 Encounter for screening for diabetes mellitus: Secondary | ICD-10-CM

## 2022-10-18 MED ORDER — ARIPIPRAZOLE 2 MG PO TABS
2.0000 mg | ORAL_TABLET | Freq: Every day | ORAL | 0 refills | Status: DC
  Filled 2022-10-18: qty 30, 30d supply, fill #0

## 2022-10-18 NOTE — Assessment & Plan Note (Signed)
Referral placed to psychiatry, continue zoloft 100 mg, start abilify 2 mg daily 

## 2022-10-18 NOTE — Assessment & Plan Note (Signed)
Referral placed to psychiatry, continue zoloft 100 mg, start abilify 2 mg daily

## 2022-10-18 NOTE — Assessment & Plan Note (Signed)
Working on getting bariatric surgery

## 2022-10-18 NOTE — Assessment & Plan Note (Signed)
Continue taking olmesartan 40 mg daily

## 2022-10-19 LAB — CBC WITH DIFFERENTIAL/PLATELET
Absolute Monocytes: 517 cells/uL (ref 200–950)
Basophils Absolute: 68 cells/uL (ref 0–200)
Basophils Relative: 1 %
Eosinophils Absolute: 184 cells/uL (ref 15–500)
Eosinophils Relative: 2.7 %
HCT: 38.7 % (ref 35.0–45.0)
Hemoglobin: 12.9 g/dL (ref 11.7–15.5)
Lymphs Abs: 1510 cells/uL (ref 850–3900)
MCH: 26.5 pg — ABNORMAL LOW (ref 27.0–33.0)
MCHC: 33.3 g/dL (ref 32.0–36.0)
MCV: 79.6 fL — ABNORMAL LOW (ref 80.0–100.0)
MPV: 12 fL (ref 7.5–12.5)
Monocytes Relative: 7.6 %
Neutro Abs: 4522 cells/uL (ref 1500–7800)
Neutrophils Relative %: 66.5 %
Platelets: 234 10*3/uL (ref 140–400)
RBC: 4.86 10*6/uL (ref 3.80–5.10)
RDW: 13.3 % (ref 11.0–15.0)
Total Lymphocyte: 22.2 %
WBC: 6.8 10*3/uL (ref 3.8–10.8)

## 2022-10-19 LAB — COMPLETE METABOLIC PANEL WITH GFR
AG Ratio: 2 (calc) (ref 1.0–2.5)
ALT: 20 U/L (ref 6–29)
AST: 15 U/L (ref 10–30)
Albumin: 4.6 g/dL (ref 3.6–5.1)
Alkaline phosphatase (APISO): 84 U/L (ref 31–125)
BUN: 13 mg/dL (ref 7–25)
CO2: 27 mmol/L (ref 20–32)
Calcium: 9.7 mg/dL (ref 8.6–10.2)
Chloride: 102 mmol/L (ref 98–110)
Creat: 0.55 mg/dL (ref 0.50–0.97)
Globulin: 2.3 g/dL (calc) (ref 1.9–3.7)
Glucose, Bld: 97 mg/dL (ref 65–99)
Potassium: 4.4 mmol/L (ref 3.5–5.3)
Sodium: 138 mmol/L (ref 135–146)
Total Bilirubin: 0.5 mg/dL (ref 0.2–1.2)
Total Protein: 6.9 g/dL (ref 6.1–8.1)
eGFR: 123 mL/min/{1.73_m2} (ref >=60)

## 2022-10-19 LAB — HEMOGLOBIN A1C
Hgb A1c MFr Bld: 5.5 % of total Hgb (ref <5.7)
Mean Plasma Glucose: 111 mg/dL
eAG (mmol/L): 6.2 mmol/L

## 2022-10-19 LAB — LIPID PANEL
Cholesterol: 157 mg/dL (ref <200)
HDL: 48 mg/dL — ABNORMAL LOW (ref >=50)
LDL Cholesterol (Calc): 82 mg/dL (calc)
Non-HDL Cholesterol (Calc): 109 mg/dL (calc) (ref <130)
Total CHOL/HDL Ratio: 3.3 (calc) (ref <5.0)
Triglycerides: 170 mg/dL — ABNORMAL HIGH (ref <150)

## 2022-10-19 LAB — HEPATITIS C ANTIBODY: Hepatitis C Ab: NONREACTIVE

## 2022-10-20 ENCOUNTER — Other Ambulatory Visit: Payer: Self-pay

## 2022-10-20 ENCOUNTER — Other Ambulatory Visit: Payer: Self-pay | Admitting: Nurse Practitioner

## 2022-10-20 ENCOUNTER — Emergency Department
Admission: EM | Admit: 2022-10-20 | Discharge: 2022-10-20 | Disposition: A | Discharge status: Home / Self Care | Payer: PRIVATE HEALTH INSURANCE | Attending: Emergency Medicine | Admitting: Emergency Medicine

## 2022-10-20 ENCOUNTER — Emergency Department: Payer: PRIVATE HEALTH INSURANCE

## 2022-10-20 DIAGNOSIS — I10 Essential (primary) hypertension: Secondary | ICD-10-CM | POA: Insufficient documentation

## 2022-10-20 DIAGNOSIS — Y990 Civilian activity done for income or pay: Secondary | ICD-10-CM | POA: Diagnosis not present

## 2022-10-20 DIAGNOSIS — X509XXA Other and unspecified overexertion or strenuous movements or postures, initial encounter: Secondary | ICD-10-CM | POA: Insufficient documentation

## 2022-10-20 DIAGNOSIS — M545 Low back pain, unspecified: Secondary | ICD-10-CM | POA: Diagnosis present

## 2022-10-20 DIAGNOSIS — F331 Major depressive disorder, recurrent, moderate: Secondary | ICD-10-CM

## 2022-10-20 DIAGNOSIS — F411 Generalized anxiety disorder: Secondary | ICD-10-CM

## 2022-10-20 DIAGNOSIS — G8929 Other chronic pain: Secondary | ICD-10-CM

## 2022-10-20 DIAGNOSIS — S39012A Strain of muscle, fascia and tendon of lower back, initial encounter: Secondary | ICD-10-CM | POA: Diagnosis not present

## 2022-10-20 LAB — POC URINE PREG, ED: Preg Test, Ur: NEGATIVE

## 2022-10-20 IMAGING — US US EXTREM LOW VENOUS*R*
1 series · 14 of 24 positions shown · non-contrast
Comparison: None.

CLINICAL DATA: Right knee and right leg pain, swelling

EXAM:
RIGHT LOWER EXTREMITY VENOUS DOPPLER ULTRASOUND
TECHNIQUE: Gray-scale sonography with compression, as well as color and duplex
ultrasound, were performed to evaluate the deep venous system(s)
from the level of the common femoral vein through the popliteal and
proximal calf veins.

[Series 1: us venous img lower uni right (dvt) · portal-venous · 14 of 33 slices shown]
[im 1/33]
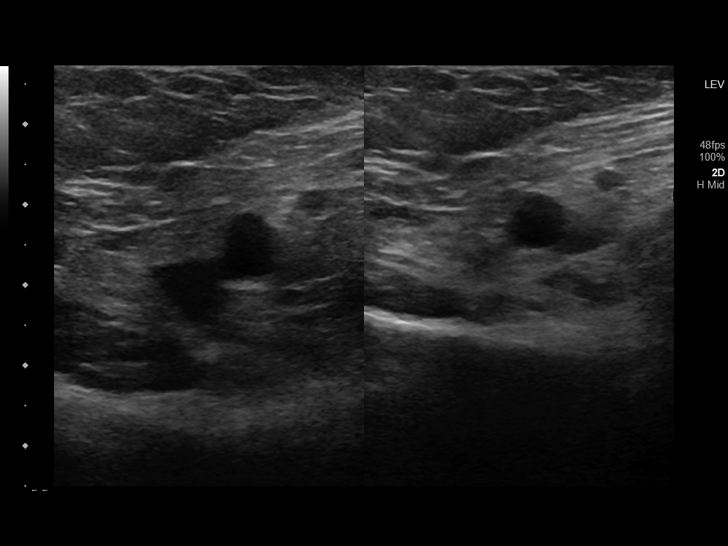
[im 3/33]
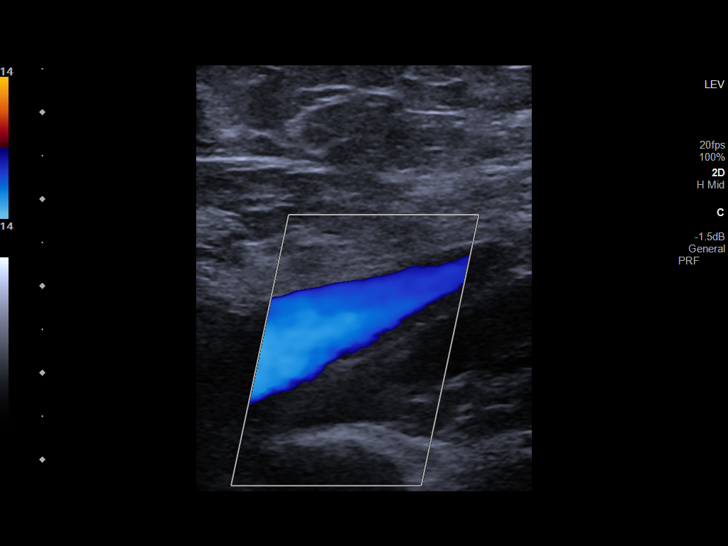
[im 6/33]
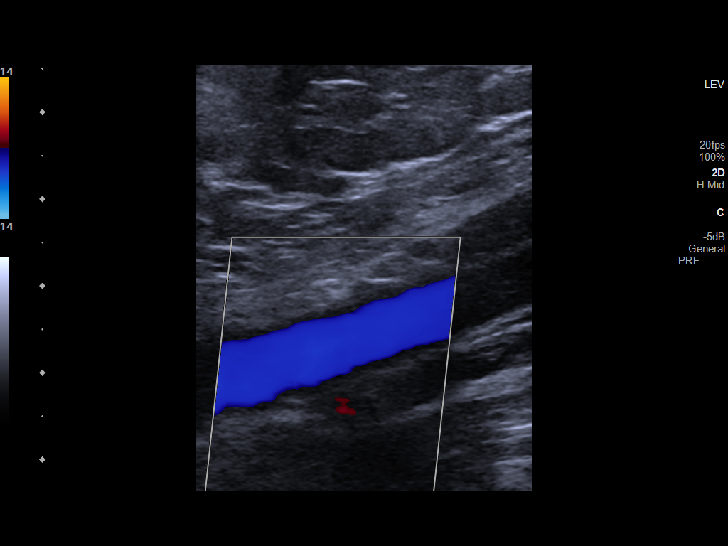
[im 9/33]
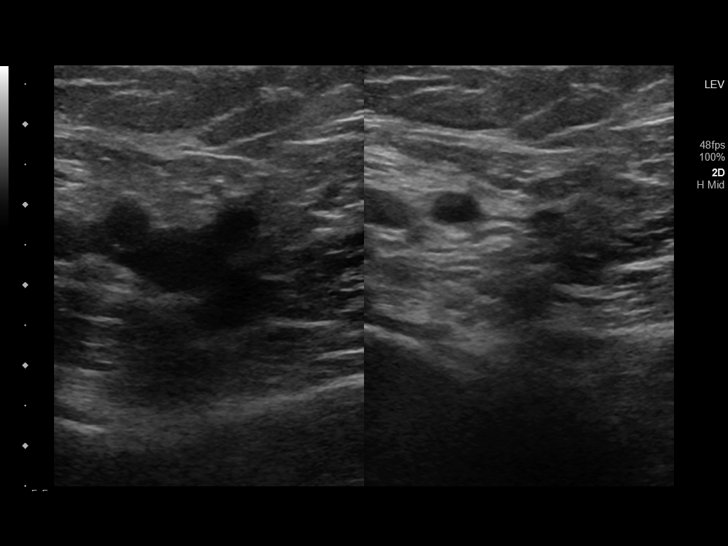
[im 10/33]
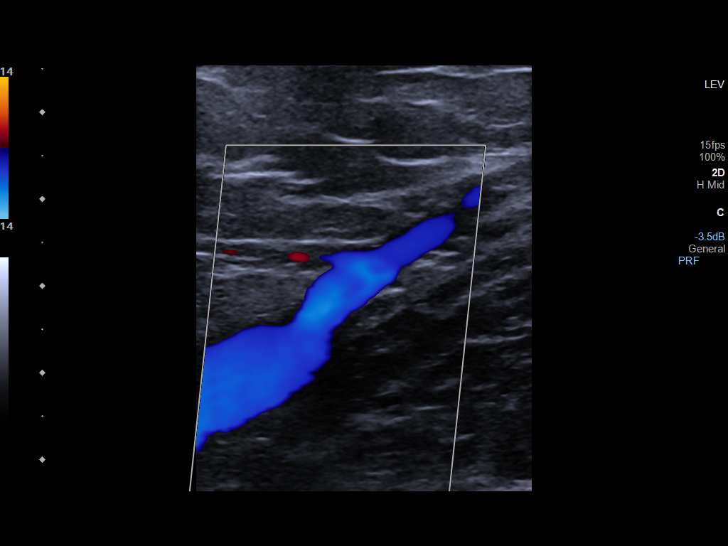
[im 13/33]
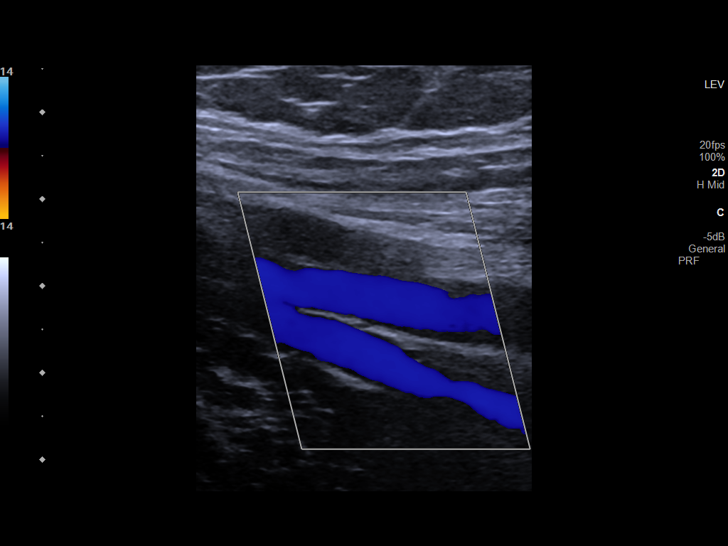
[im 16/33]
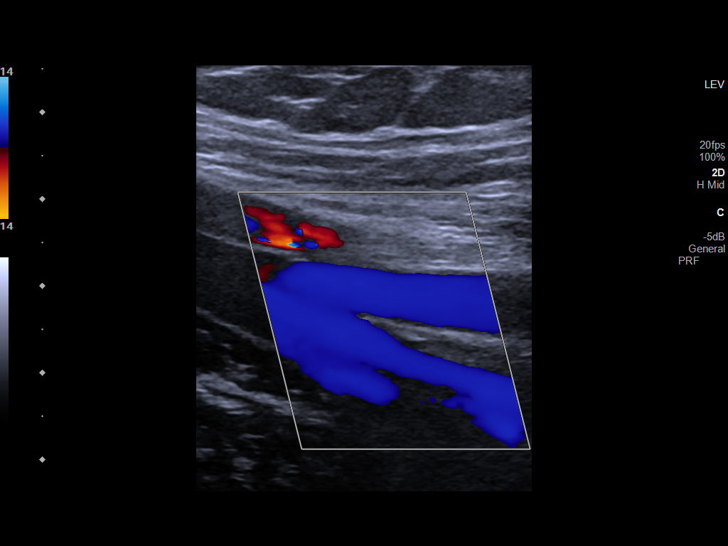
[im 17/33]
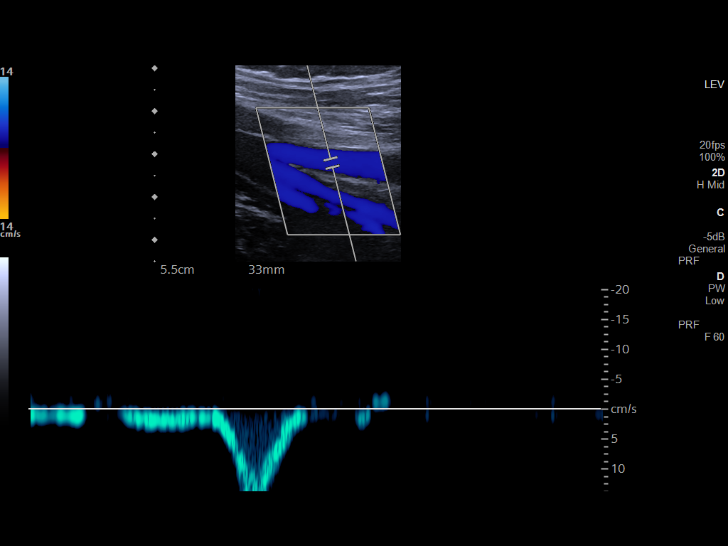
[im 20/33]
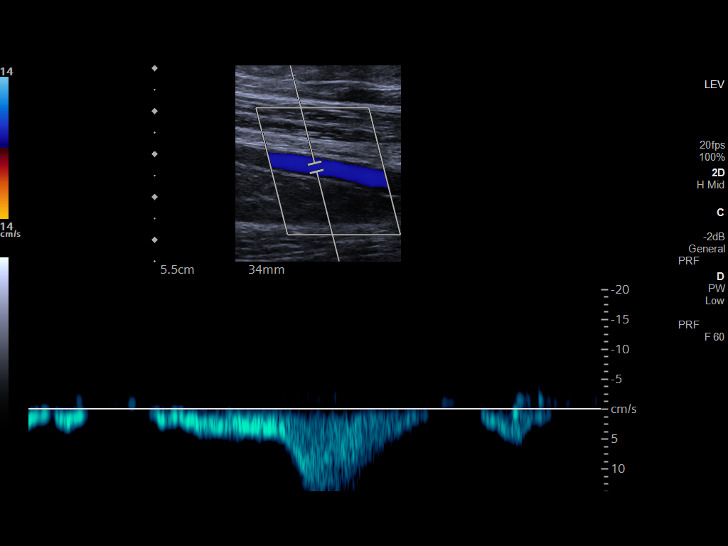
[im 23/33]
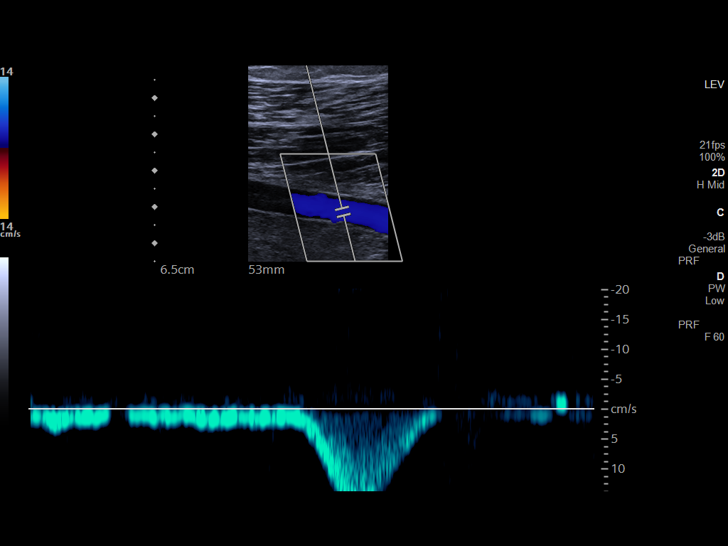
[im 26/33]
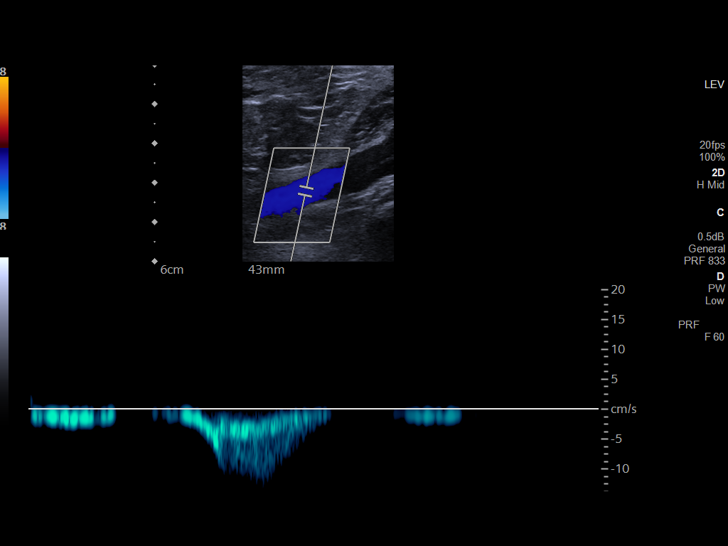
[im 27/33]
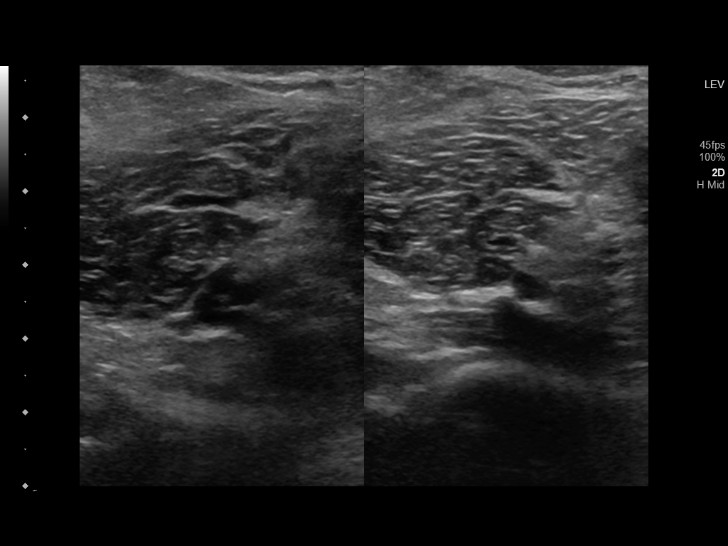
[im 30/33]
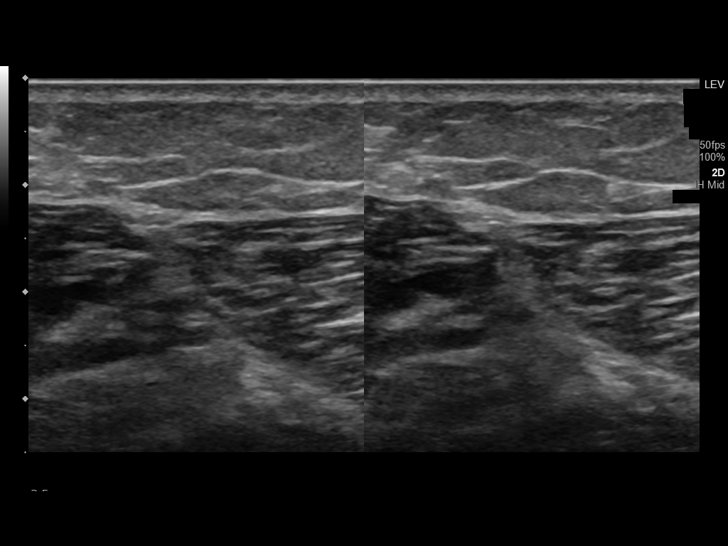
[im 33/33]
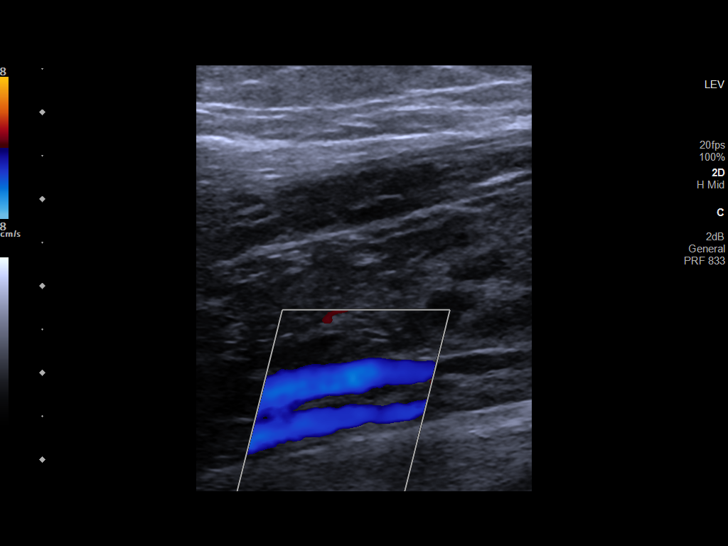

[14 of 24 positions shown; findings below may reference images not displayed]

FINDINGS: VENOUS

Normal compressibility of the common femoral, superficial femoral,
and popliteal veins, as well as the visualized calf veins.
Visualized portions of profunda femoral vein and great saphenous
vein unremarkable. No filling defects to suggest DVT on grayscale or
color Doppler imaging. Doppler waveforms show normal direction of
venous flow, normal respiratory plasticity and response to
augmentation.

Limited views of the contralateral common femoral vein are
unremarkable.

OTHER

None.

Limitations: none
IMPRESSION: No evidence of right lower extremity DVT.

## 2022-10-20 MED ORDER — CYCLOBENZAPRINE HCL 10 MG PO TABS: 10.0000 mg | ORAL_TABLET | Freq: Three times a day (TID) | ORAL | 0 refills | Status: DC | PRN

## 2022-10-20 MED ORDER — KETOROLAC TROMETHAMINE 60 MG/2ML IM SOLN
30.0000 mg | Freq: Once | INTRAMUSCULAR | Status: AC
  Administered 2022-10-20: 30 mg via INTRAMUSCULAR
  Filled 2022-10-20: qty 2

## 2022-10-20 MED ORDER — NAPROXEN 500 MG PO TABS: 500.0000 mg | ORAL_TABLET | Freq: Two times a day (BID) | ORAL | 0 refills | Status: DC

## 2022-10-20 NOTE — ED Provider Notes (Signed)
[facility] Provider Note    Event Date/Time   First MD Initiated Contact with Patient 10/20/22 2150     (approximate)   History   Back Pain   HPI  Jessica Espinoza is a 37 y.o. female with history of ADHD, hypertension, IBS, and as listed in the EMR presents to the emergency department for treatment and evaluation of low back pain and sacral pain.  While at work, she assisted the patient to the ground and back up into a chair or bed several times throughout the shift.  No specific injury but at the end of the day she started having tightness and burning in the low back radiating into both legs.  She has no history of back pain.  She has not taken any medications for relief.     Physical Exam   Triage Vital Signs: ED Triage Vitals  Enc Vitals Group     BP 10/20/22 1940 (!) 182/106     Pulse Rate 10/20/22 1940 77     Resp 10/20/22 1940 18     Temp 10/20/22 1940 98 F (36.7 C)     Temp Source 10/20/22 1940 Oral     SpO2 10/20/22 1940 100 %     Weight 10/20/22 1938 222 lb (100.7 kg)     Height 10/20/22 1938 5\' 4"  (1.626 m)     Head Circumference --      Peak Flow --      Pain Score 10/20/22 1938 9     Pain Loc --      Pain Edu? --      Excl. in [place]? --     Most recent vital signs: Vitals:   10/20/22 1940 10/20/22 2339  BP: (!) 182/106 (!) 163/89  Pulse: 77 76  Resp: 18 18  Temp: 98 F (36.7 C)   SpO2: 100%      General: Awake, no distress. CV:  Good peripheral perfusion.  Resp:  Normal effort.  Abd:  No distention.  Other:  FROM of lower extremities. No focal tenderness in the lumbar spine.   ED Results / Procedures / Treatments   Labs (all labs ordered are listed, but only abnormal results are displayed) Labs Reviewed  POC URINE PREG, ED     EKG     RADIOLOGY  Imaging of the lumbar spine is negative for acute concerns.  Images viewed and interpreted by me.  Radiology report consistent with the  same.  PROCEDURES:  Critical Care performed: No  Procedures   MEDICATIONS ORDERED IN ED: Medications  ketorolac (TORADOL) injection 30 mg (30 mg Intramuscular Given 10/20/22 2325)     IMPRESSION / MDM / ASSESSMENT AND PLAN / ED COURSE  I reviewed the triage vital signs and the nursing notes.                              Differential diagnosis includes, but is not limited to, lumbosacral strain, disc injury, sciatica  Patient's presentation is most consistent with acute illness / injury with system symptoms.  37 year old female presenting to the emergency department for treatment and evaluation of lower back pain after helping a patient several times today.  No specific injury.  Imaging is negative for any acute bony abnormality.  Plan will be to treat her with Toradol tonight, prescribe Naprosyn and Flexeril and put her on light duty tomorrow.  She is to follow-up with employee health/orthopedics  especially if symptoms or not improving over the week.  She was encouraged to return to the emergency department for symptoms of change or worsen if she is unable to schedule an appointment.     FINAL CLINICAL IMPRESSION(S) / ED DIAGNOSES   Final diagnoses:  Strain of lumbar region, initial encounter     Rx / DC Orders   ED Discharge Orders          Ordered    naproxen (NAPROSYN) 500 MG tablet  2 times daily with meals        10/20/22 2322    cyclobenzaprine (FLEXERIL) 10 MG tablet  3 times daily PRN        10/20/22 2322             Note:  This document was prepared using Dragon voice recognition software and may include unintentional dictation errors.   Victorino Dike, FNP 10/22/22 1821    Nance Pear, MD 10/24/22 [phone]

## 2022-10-20 NOTE — ED Triage Notes (Signed)
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 Pt now reporting lumbar and sacral pain with tightness and burning. Reports radiation down both legs and up into spine as well. Pt ambulatory in triage. Pt alert and oriented following commands. Breathing unlabored speaking in full sentences. Pt denies hx of back problems or surgery.

## 2022-10-22 ENCOUNTER — Other Ambulatory Visit: Payer: Self-pay

## 2022-10-22 MED ORDER — LISDEXAMFETAMINE DIMESYLATE 60 MG PO CAPS
60.0000 mg | ORAL_CAPSULE | Freq: Every day | ORAL | 0 refills | Status: AC
  Filled 2022-10-22: qty 30, 30d supply, fill #0

## 2022-10-22 NOTE — Telephone Encounter (Signed)
Requested medication (s) are due for refill today:   Provider to review for Robaxin, Sertraline - yes  Requested medication (s) are on the active medication list:   Yes for sertraline,   No for Robaxin  Future visit scheduled:   Yes   Last ordered: Sertraline 09/20/2022 #30, 0 refills;    Robaxin 500 mg discontinued 10/20/2022.    Returned for provider review.   Only #30 ordered for Sertraline.    Looks like pt is using Flexeril now.     Requested Prescriptions  Pending Prescriptions Disp Refills   sertraline (ZOLOFT) 100 MG tablet 30 tablet 0    Sig: Take 1 tablet (100 mg total) by mouth daily.     Psychiatry:  Antidepressants - SSRI - sertraline Passed - 10/20/2022  2:57 PM      Passed - AST in normal range and within 360 days    AST  Date Value Ref Range Status  10/18/2022 15 10 - 30 U/L Final         Passed - ALT in normal range and within 360 days    ALT  Date Value Ref Range Status  10/18/2022 20 6 - 29 U/L Final         Passed - Completed PHQ-2 or PHQ-9 in the last 360 days      Passed - Valid encounter within last 6 months    Recent Outpatient Visits           4 days ago Moderate episode of recurrent major depressive disorder [facility])   [facility] Bo Merino, FNP   1 month ago Essential hypertension   [facility] Bo Merino, FNP       Future Appointments             In 3 weeks Bo Merino, [facility], [facility]             methocarbamol (ROBAXIN) 500 MG tablet 20 tablet 0    Sig: Take 1 tablet (500 mg total) by mouth 2 (two) times daily as needed for muscle spasms.     Not Delegated - Analgesics:  Muscle Relaxants Failed - 10/20/2022  2:57 PM      Failed - This refill cannot be delegated      Passed - Valid encounter within last 6 months    Recent Outpatient Visits           4 days ago Moderate episode of recurrent major depressive disorder [facility])   [facility] Bo Merino, FNP   1 month ago Essential hypertension   [facility] Bo Merino, FNP       Future Appointments             In 3 weeks Reece Packer, Myna Hidalgo, [facility], [facility]

## 2022-10-23 ENCOUNTER — Other Ambulatory Visit: Payer: Self-pay

## 2022-10-23 ENCOUNTER — Encounter: Payer: Self-pay | Admitting: Nurse Practitioner

## 2022-10-23 MED ORDER — COVID-19 MRNA VAC-TRIS(PFIZER) 30 MCG/0.3ML IM SUSY
PREFILLED_SYRINGE | INTRAMUSCULAR | 0 refills | Status: DC
  Filled 2022-10-23 – 2022-10-24 (×2): qty 0.3, 1d supply, fill #0

## 2022-10-24 ENCOUNTER — Other Ambulatory Visit: Payer: Self-pay

## 2022-10-24 MED ORDER — SERTRALINE HCL 100 MG PO TABS
100.0000 mg | ORAL_TABLET | Freq: Every day | ORAL | 0 refills | Status: DC
  Filled 2022-10-24: qty 30, 30d supply, fill #0

## 2022-10-25 ENCOUNTER — Encounter: Payer: Self-pay | Admitting: Emergency Medicine

## 2022-11-14 NOTE — Progress Notes (Unsigned)
   There were no vitals taken for this visit.   Subjective:    Patient ID: Jessica Espinoza, female    DOB: Feb 05, 1986, 37 y.o.   MRN: [id number]  HPI: Jessica Espinoza is a 37 y.o. female  No chief complaint on file.   Depression/anxiety: patient is currently taking zoloft 100 mg daily. She was also recently started on abilify. She is here today for four week follow up.  We did place a referral to psychiatry at last appointment.  Patient reports ***.        10/18/2022    9:58 AM 09/20/2022    1:11 PM  Depression screen PHQ 2/9  Decreased Interest 2 3  Down, Depressed, Hopeless 2 2  PHQ - 2 Score 4 5  Altered sleeping 3 3  Tired, decreased energy 3 3  Change in appetite 3 3  Feeling bad or failure about yourself  1 3  Trouble concentrating 2 1  Moving slowly or fidgety/restless 1 1  Suicidal thoughts 0 0  PHQ-9 Score 17 19  Difficult doing work/chores Very difficult Somewhat difficult       10/18/2022   10:01 AM 09/20/2022    1:12 PM  GAD 7 : Generalized Anxiety Score  Nervous, Anxious, on Edge 3 3  Control/stop worrying 3 3  Worry too much - different things 3 3  Trouble relaxing 1 2  Restless 0 1  Easily annoyed or irritable 3 3  Afraid - awful might happen 3 3  Total GAD 7 Score 16 18  Anxiety Difficulty Very difficult Somewhat difficult      Relevant past medical, surgical, family and social history reviewed and updated as indicated. Interim medical history since our last visit reviewed. Allergies and medications reviewed and updated.  Review of Systems  Constitutional: Negative for fever or weight change.  Respiratory: Negative for cough and shortness of breath.   Cardiovascular: Negative for chest pain or palpitations.  Gastrointestinal: Negative for abdominal pain, no bowel changes.  Musculoskeletal: Negative for gait problem or joint swelling.  Skin: Negative for rash.  Neurological: Negative for dizziness or headache.  No other specific complaints  in a complete review of systems (except as listed in HPI above).      Objective:    There were no vitals taken for this visit.  Wt Readings from Last 3 Encounters:  10/20/22 222 lb (100.7 kg)  10/18/22 222 lb 6.4 oz (100.9 kg)  09/20/22 224 lb 6.4 oz (101.8 kg)    Physical Exam  Constitutional: Patient appears well-developed and well-nourished. Obese  No distress.  HEENT: head atraumatic, normocephalic, pupils equal and reactive to light, neck supple Cardiovascular: Normal rate, regular rhythm and normal heart sounds.  No murmur heard. No BLE edema. Pulmonary/Chest: Effort normal and breath sounds normal. No respiratory distress. Abdominal: Soft.  There is no tenderness. Psychiatric: Patient has a normal mood and affect. behavior is normal. Judgment and thought content normal.      Assessment & Plan:   Problem List Items Addressed This Visit   None    Follow up plan: No follow-ups on file.

## 2022-11-15 ENCOUNTER — Encounter: Payer: Self-pay | Admitting: Nurse Practitioner

## 2022-11-15 ENCOUNTER — Other Ambulatory Visit: Payer: Self-pay

## 2022-11-15 ENCOUNTER — Ambulatory Visit (INDEPENDENT_AMBULATORY_CARE_PROVIDER_SITE_OTHER): Payer: 59 | Admitting: Nurse Practitioner

## 2022-11-15 VITALS — BP 112/74 | HR 100 | Temp 97.8°F | Resp 18 | Ht 64.0 in | Wt 231.9 lb

## 2022-11-15 DIAGNOSIS — F331 Major depressive disorder, recurrent, moderate: Secondary | ICD-10-CM

## 2022-11-15 DIAGNOSIS — F411 Generalized anxiety disorder: Secondary | ICD-10-CM | POA: Diagnosis not present

## 2022-11-15 MED ORDER — SERTRALINE HCL 100 MG PO TABS
100.0000 mg | ORAL_TABLET | Freq: Every day | ORAL | 0 refills | Status: DC
  Filled 2022-11-15: qty 30, 30d supply, fill #0

## 2022-11-15 MED ORDER — ARIPIPRAZOLE 2 MG PO TABS
2.0000 mg | ORAL_TABLET | Freq: Every day | ORAL | 0 refills | Status: DC
  Filled 2022-11-15: qty 30, 30d supply, fill #0

## 2022-11-15 NOTE — Assessment & Plan Note (Signed)
Continue taking zoloft 100 mg daily and abilify 2 mg daily, schedule appointment with psychiatry.

## 2022-11-16 ENCOUNTER — Other Ambulatory Visit: Payer: Self-pay

## 2022-11-16 DIAGNOSIS — I10 Essential (primary) hypertension: Secondary | ICD-10-CM | POA: Diagnosis not present

## 2022-11-16 DIAGNOSIS — Z8659 Personal history of other mental and behavioral disorders: Secondary | ICD-10-CM | POA: Diagnosis not present

## 2022-11-16 DIAGNOSIS — F909 Attention-deficit hyperactivity disorder, unspecified type: Secondary | ICD-10-CM | POA: Diagnosis not present

## 2022-11-16 DIAGNOSIS — K760 Fatty (change of) liver, not elsewhere classified: Secondary | ICD-10-CM | POA: Diagnosis not present

## 2022-11-16 DIAGNOSIS — F419 Anxiety disorder, unspecified: Secondary | ICD-10-CM | POA: Diagnosis not present

## 2022-11-19 ENCOUNTER — Other Ambulatory Visit: Payer: Self-pay

## 2022-11-19 ENCOUNTER — Other Ambulatory Visit (HOSPITAL_COMMUNITY): Payer: Self-pay | Admitting: Surgery

## 2022-11-23 ENCOUNTER — Other Ambulatory Visit: Payer: Self-pay

## 2022-11-23 ENCOUNTER — Encounter: Payer: 59 | Attending: Surgery | Admitting: Dietician

## 2022-11-23 ENCOUNTER — Encounter: Payer: Self-pay | Admitting: Dietician

## 2022-11-23 VITALS — Ht 64.0 in | Wt 231.4 lb

## 2022-11-23 DIAGNOSIS — E669 Obesity, unspecified: Secondary | ICD-10-CM | POA: Diagnosis not present

## 2022-11-23 DIAGNOSIS — Z79899 Other long term (current) drug therapy: Secondary | ICD-10-CM | POA: Diagnosis not present

## 2022-11-23 DIAGNOSIS — F902 Attention-deficit hyperactivity disorder, combined type: Secondary | ICD-10-CM | POA: Diagnosis not present

## 2022-11-23 MED ORDER — AMPHETAMINE-DEXTROAMPHETAMINE 20 MG PO TABS
10.0000 mg | ORAL_TABLET | Freq: Every day | ORAL | 0 refills | Status: DC
  Filled 2022-11-23: qty 30, 30d supply, fill #0

## 2022-11-23 MED ORDER — LISDEXAMFETAMINE DIMESYLATE 60 MG PO CAPS
60.0000 mg | ORAL_CAPSULE | Freq: Every morning | ORAL | 0 refills | Status: DC
  Filled 2022-11-23: qty 30, 30d supply, fill #0

## 2022-11-23 NOTE — Progress Notes (Signed)
Nutrition Assessment for Bariatric Surgery: Pre-Surgery Behavioral and Nutrition Intervention Program   Medical Nutrition Therapy  Appt Start Time: 10:45    End Time: 11:58  Patient was seen on 11/23/2022 for Pre-Operative Nutrition Assessment. Purpose of todays visit  enhance perioperative outcomes along with a healthy weight maintenance   Referral stated Supervised Weight Loss (SWL) visits needed: 0  Pt completed visits.   Pt has cleared nutrition requirements.   Planned surgery: Sleeve Gastrectomy Pt expectation of surgery: to be under 200 lbs to start, then reassess.   NUTRITION ASSESSMENT   Anthropometrics  Start weight at [facility]: 231.4 lbs (date: 11/23/2022)  Height: 64 in BMI: 39.72 kg/m2     Clinical   Pharmacotherapy: History of weight loss medication used: Saxenda  Medical hx: HTN Medications: losartan, vyvanse, sertraline, amplilify, Vit D, multivitamin, probiotic  Labs: HDL 48; triglycerides 170; glucose 104 Notable signs/symptoms: none noted Any previous deficiencies? No  Evaluation of Nutritional Deficiencies: Micronutrient Nutrition Focused Physical Exam: Hair: No issues observed Eyes: No issues observed Mouth: No issues observed Neck: No issues observed Nails: No issues observed Skin: No issues observed  Lifestyle & Dietary Hx  Pt states she is allergic to mustard. 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 Pt states she has ADHD, stating she gets distracted by noises. Pt states she tries to incorporate vegetables into her meals.  Pt states she quit drinking sodas, and the family is trying to cut back on sugar snacks.  Current Physical Activity Recommendations state 150 minutes per week of moderate to vigorous movement including Cardio and 1-2 days of resistance activities as well as flexibility/balance activities:  Pts current physical activity: walking her dogs two times a day, about 1.5 hours, with 75-100% recommendation  reached   Sleep Hygiene: duration and quality: good quality, 6-8 hours a night  Current Patient Perceived Stress Level as stated by pt on a scale of 1-10:  10       Stress Management Techniques: stress ball, time to self, sensory treatments with sounds.  According to the Dietary Guidelines for Americans Recommendation: equivalent 1.5-2 cups fruits per day, equivalent 2-3 cups vegetables per day and at least half all grains whole  Fruit servings per day (on average): 2, meeting 75% recommendation  Non-starchy vegetable servings per day (on average): 3, meeting 100% recommendation  Whole Grains per day (on average): 0  Number of meals missed/skipped per week out of 21: 7  24-Hr Dietary Recall First Meal: coffee or skip Snack:  Second Meal: sandwich or salad Snack: gummies or other snacks Third Meal: pasta or potatoes, meat vegetable, bread Snack: gummies or pretzels or chips or ice cream or apple and peanut butter Beverages: coffee with sf creamer, water, english breakfast tea with sugar and milk  Alcoholic beverages per week: 1 drink once or twice a year   Estimated Energy Needs Calories: 1500   NUTRITION DIAGNOSIS  Overweight/obesity ([place]-3.3) related to past poor dietary habits and physical inactivity as evidenced by patient w/ planned sleeve surgery following dietary guidelines for continued weight loss.    NUTRITION INTERVENTION  Nutrition counseling (C-1) and education (E-2) to facilitate bariatric surgery goals.  Educated pt on micronutrient deficiencies post-surgery and behavioral/dietary strategies to start in order to mitigate that risk   Behavioral and Dietary Interventions Pre-Op Goals Reviewed with the Patient Nutrition: Healthy Eating Behaviors Switch to non-caloric, non-carbonated and non-caffeinated beverages such as  water, unsweetened tea, Crystal Light and zero calorie  beverages (aim for 64 oz. per day) Cut out grazing between meals or at night  Find a  protein shake you like Eat every 3-5 hours        Eliminate distractions while eating (TV, computer, reading, driving, texting) Take 20-30 minutes to eat a meal  Decrease high sugar foods/decrease high fat/fried foods Eliminate alcoholic beverages Increase protein intake (eggs, fish, chicken, yogurt) before surgery Eat non starchy vegetables 2 times a day 7 days a week Eat complex carbohydrates such as whole grains and fruits   Behavioral Modification: Physical Activity Increase my usual daily activity (use stairs, park farther, etc.) Engage in _______________________  activity  _______ minutes ______ times per week  Other:    _________________________________________________________________     Problem Solving I will think about my usual eating patterns and how to tweak them How can my friends and family support me Barriers to starting my changes Learn and understand appetite verses hunger   Healthy Coping Allow for ___________ activities per week to help me manage stress Reframe negative thoughts I will keep a picture of someone or something that is my inspiration & look at it daily   Monitoring  Weigh myself once a week  Measure my progress by monitoring how my clothes fit Keep a food record of what I eat and drink for the next ________ (time period) Take pictures of what I eat and drink for the next ________ (time period) Use an app to count steps/day for the next_______ (time period) Measure my progress such as increased energy and more restful sleep Monitor your acid reflux and bowel habits, are they getting better?    Handouts Provided Include  Bariatric Surgery handouts (Nutrition Visits, Pre Surgery Behavioral Change Goals, Protein Shakes Brands to Choose From, Vitamins & Mineral Supplementation)  Learning Style & Readiness for Change Teaching method utilized: Visual, Auditory, and hands on  Demonstrated degree of understanding via: Teach Back  Readiness Level:  preparation Barriers to learning/adherence to lifestyle change: nothing identified  RD's Notes for Next Visit     MONITORING & EVALUATION Dietary intake, weekly physical activity, body weight, and preoperative behavioral change goals   Next Steps  Pt has completed visits. No further supervised visits required/recomended  Patient is to follow up at [facility] for pre-op class, >2 weeks prior to scheduled surgery

## 2022-11-27 ENCOUNTER — Inpatient Hospital Stay: Admission: RE | Admit: 2022-11-27 | Payer: 59 | Source: Ambulatory Visit

## 2022-11-27 ENCOUNTER — Other Ambulatory Visit: Payer: 59

## 2022-11-27 ENCOUNTER — Ambulatory Visit: Payer: 59 | Attending: Nurse Practitioner

## 2022-12-06 ENCOUNTER — Ambulatory Visit (INDEPENDENT_AMBULATORY_CARE_PROVIDER_SITE_OTHER): Payer: 59 | Admitting: Licensed Clinical Social Worker

## 2022-12-06 DIAGNOSIS — F909 Attention-deficit hyperactivity disorder, unspecified type: Secondary | ICD-10-CM | POA: Diagnosis not present

## 2022-12-06 DIAGNOSIS — F411 Generalized anxiety disorder: Secondary | ICD-10-CM | POA: Diagnosis not present
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
## 2022-12-14 ENCOUNTER — Ambulatory Visit
Admission: RE | Admit: 2022-12-14 | Discharge: 2022-12-14 | Disposition: A | Discharge status: Home / Self Care | Payer: 59 | Source: Ambulatory Visit | Attending: Surgery | Admitting: Surgery

## 2022-12-14 ENCOUNTER — Other Ambulatory Visit (HOSPITAL_COMMUNITY): Payer: Self-pay | Admitting: Surgery

## 2022-12-14 DIAGNOSIS — Z0389 Encounter for observation for other suspected diseases and conditions ruled out: Secondary | ICD-10-CM | POA: Diagnosis not present

## 2022-12-14 DIAGNOSIS — Z01818 Encounter for other preprocedural examination: Secondary | ICD-10-CM | POA: Diagnosis not present

## 2023-01-18 ENCOUNTER — Other Ambulatory Visit: Payer: Self-pay | Admitting: Nurse Practitioner

## 2023-01-18 ENCOUNTER — Other Ambulatory Visit: Payer: Self-pay

## 2023-01-18 DIAGNOSIS — F331 Major depressive disorder, recurrent, moderate: Secondary | ICD-10-CM

## 2023-01-18 DIAGNOSIS — F411 Generalized anxiety disorder: Secondary | ICD-10-CM

## 2023-01-18 MED ORDER — AMPHETAMINE-DEXTROAMPHETAMINE 20 MG PO TABS
10.0000 mg | ORAL_TABLET | Freq: Every day | ORAL | 0 refills | Status: DC
  Filled 2023-01-18: qty 30, 30d supply, fill #0

## 2023-01-18 MED ORDER — LISDEXAMFETAMINE DIMESYLATE 60 MG PO CAPS
60.0000 mg | ORAL_CAPSULE | Freq: Every morning | ORAL | 0 refills | Status: DC
  Filled 2023-01-18: qty 30, 30d supply, fill #0

## 2023-01-18 MED FILL — Sertraline HCl Tab 100 MG: ORAL | 90 days supply | Qty: 90 | Fill #0 | Status: AC

## 2023-01-18 NOTE — Telephone Encounter (Signed)
Requested medication (s) are due for refill today: yes  Requested medication (s) are on the active medication list: yes  Last refill:  11/15/22  Future visit scheduled: yes  Notes to clinic:  Unable to refill per protocol, cannot delegate.      Requested Prescriptions  Pending Prescriptions Disp Refills   ARIPiprazole (ABILIFY) 2 MG tablet 30 tablet 0    Sig: Take 1 tablet (2 mg total) by mouth daily.     Not Delegated - Psychiatry:  Antipsychotics - Second Generation (Atypical) - aripiprazole Failed - 01/18/2023 12:34 PM      Failed - This refill cannot be delegated      Failed - TSH in normal range and within 360 days    No results found for: "TSH", "POCTSH", "TSHREFLEX"       Failed - Lipid Panel in normal range within the last 12 months    Cholesterol  Date Value Ref Range Status  10/18/2022 157 <200 mg/dL Final   LDL Cholesterol (Calc)  Date Value Ref Range Status  10/18/2022 82 mg/dL (calc) Final    Comment:    Reference range: <100 . Desirable range <100 mg/dL for primary prevention;   <70 mg/dL for patients with CHD or diabetic patients  with > or = 2 CHD risk factors. Marland Kitchen LDL-C is now calculated using the Martin-Hopkins  calculation, which is a validated novel method providing  better accuracy than the Friedewald equation in the  estimation of LDL-C.  Horald Pollen et al. Lenox Ahr. 1324;401(02): 2061-2068  (http://education.[facility].com/faq/FAQ164)    HDL  Date Value Ref Range Status  10/18/2022 48 (L) > OR = 50 mg/dL Final   Triglycerides  Date Value Ref Range Status  10/18/2022 170 (H) <150 mg/dL Final         Passed - Completed PHQ-2 or PHQ-9 in the last 360 days      Passed - Last BP in normal range    BP Readings from Last 1 Encounters:  11/15/22 112/74         Passed - Last Heart Rate in normal range    Pulse Readings from Last 1 Encounters:  11/15/22 100         Passed - Valid encounter within last 6 months    Recent Outpatient Visits            2 months ago Moderate episode of recurrent major depressive disorder [facility])   [facility] Della Goo F, FNP   3 months ago Moderate episode of recurrent major depressive disorder [facility])   [facility] Berniece Salines, FNP   4 months ago Essential hypertension   [facility] Della Goo F, FNP              Passed - CBC within normal limits and completed in the last 12 months    WBC  Date Value Ref Range Status  10/18/2022 6.8 3.8 - 10.8 Thousand/uL Final   RBC  Date Value Ref Range Status  10/18/2022 4.86 3.80 - 5.10 Million/uL Final   Hemoglobin  Date Value Ref Range Status  10/18/2022 12.9 11.7 - 15.5 g/dL Final   HGB  Date Value Ref Range Status  09/13/2013 12.7 12.0 - 16.0 g/dL Final   HCT  Date Value Ref Range Status  10/18/2022 38.7 35.0 - 45.0 % Final  09/14/2013 33.6 (L) 35.0 - 47.0 % Final   MCHC  Date Value Ref Range Status  10/18/2022  33.3 32.0 - 36.0 g/dL Final   [facility]  Date Value Ref Range Status  10/18/2022 26.5 (L) 27.0 - 33.0 pg Final   MCV  Date Value Ref Range Status  10/18/2022 79.6 (L) 80.0 - 100.0 fL Final  09/13/2013 81 80 - 100 fL Final   No results found for: "PLTCOUNTKUC", "LABPLAT", "POCPLA" RDW  Date Value Ref Range Status  10/18/2022 13.3 11.0 - 15.0 % Final  09/13/2013 14.5 11.5 - 14.5 % Final         Passed - CMP within normal limits and completed in the last 12 months    Albumin  Date Value Ref Range Status  02/16/2022 4.3 3.5 - 5.0 g/dL Final   Alkaline Phosphatase  Date Value Ref Range Status  02/16/2022 77 38 - 126 U/L Final   Alkaline phosphatase (APISO)  Date Value Ref Range Status  10/18/2022 84 31 - 125 U/L Final   ALT  Date Value Ref Range Status  10/18/2022 20 6 - 29 U/L Final   AST  Date Value Ref Range Status  10/18/2022 15 10 - 30 U/L Final   BUN  Date Value Ref Range Status  10/18/2022 13 7 - 25 mg/dL  Final   Calcium  Date Value Ref Range Status  10/18/2022 9.7 8.6 - 10.2 mg/dL Final   Calcium, Ion  Date Value Ref Range Status  07/15/2019 1.15 1.15 - 1.40 mmol/L Final   CO2  Date Value Ref Range Status  10/18/2022 27 20 - 32 mmol/L Final   Bicarbonate  Date Value Ref Range Status  07/15/2019 23.4 20.0 - 28.0 mmol/L Final   TCO2  Date Value Ref Range Status  07/15/2019 25 22 - 32 mmol/L Final   Creat  Date Value Ref Range Status  10/18/2022 0.55 0.50 - 0.97 mg/dL Final   Creatinine, Urine  Date Value Ref Range Status  09/26/2015 160 mg/dL Final   Glucose, Bld  Date Value Ref Range Status  10/18/2022 97 65 - 99 mg/dL Final    Comment:    .            Fasting reference interval .    Potassium  Date Value Ref Range Status  10/18/2022 4.4 3.5 - 5.3 mmol/L Final   Sodium  Date Value Ref Range Status  10/18/2022 138 135 - 146 mmol/L Final   Total Bilirubin  Date Value Ref Range Status  10/18/2022 0.5 0.2 - 1.2 mg/dL Final   Protein, ur  Date Value Ref Range Status  07/17/2019 NEGATIVE NEGATIVE mg/dL Final   Total Protein, Urine  Date Value Ref Range Status  09/26/2015 43 mg/dL Final    Comment:    NO NORMAL RANGE ESTABLISHED FOR THIS TEST   Total Protein  Date Value Ref Range Status  10/18/2022 6.9 6.1 - 8.1 g/dL Final   GFR calc Af Amer  Date Value Ref Range Status  07/18/2019 >60 >60 mL/min Final   eGFR  Date Value Ref Range Status  10/18/2022 123 > OR = 60 mL/min/1.67m2 Final   GFR, Estimated  Date Value Ref Range Status  02/16/2022 >60 >60 mL/min Final    Comment:    (NOTE) Calculated using the CKD-EPI Creatinine Equation (2021)          Signed Prescriptions Disp Refills   sertraline (ZOLOFT) 100 MG tablet 90 tablet 0    Sig: Take 1 tablet (100 mg total) by mouth daily.     Psychiatry:  Antidepressants - SSRI - sertraline  Passed - 01/18/2023 12:34 PM      Passed - AST in normal range and within 360 days    AST  Date Value Ref  Range Status  10/18/2022 15 10 - 30 U/L Final         Passed - ALT in normal range and within 360 days    ALT  Date Value Ref Range Status  10/18/2022 20 6 - 29 U/L Final         Passed - Completed PHQ-2 or PHQ-9 in the last 360 days      Passed - Valid encounter within last 6 months    Recent Outpatient Visits           2 months ago Moderate episode of recurrent major depressive disorder [facility])   [facility] Della Goo F, FNP   3 months ago Moderate episode of recurrent major depressive disorder [facility])   [facility] Berniece Salines, FNP   4 months ago Essential hypertension   [facility] Berniece Salines, [place]

## 2023-01-18 NOTE — Telephone Encounter (Signed)
Requested Prescriptions  Pending Prescriptions Disp Refills   ARIPiprazole (ABILIFY) 2 MG tablet 30 tablet 0    Sig: Take 1 tablet (2 mg total) by mouth daily.     Not Delegated - Psychiatry:  Antipsychotics - Second Generation (Atypical) - aripiprazole Failed - 01/18/2023 12:34 PM      Failed - This refill cannot be delegated      Failed - TSH in normal range and within 360 days    No results found for: "TSH", "POCTSH", "TSHREFLEX"       Failed - Lipid Panel in normal range within the last 12 months    Cholesterol  Date Value Ref Range Status  10/18/2022 157 <200 mg/dL Final   LDL Cholesterol (Calc)  Date Value Ref Range Status  10/18/2022 82 mg/dL (calc) Final    Comment:    Reference range: <100 . Desirable range <100 mg/dL for primary prevention;   <70 mg/dL for patients with CHD or diabetic patients  with > or = 2 CHD risk factors. Marland Kitchen LDL-C is now calculated using the Martin-Hopkins  calculation, which is a validated novel method providing  better accuracy than the Friedewald equation in the  estimation of LDL-C.  Horald Pollen et al. Lenox Ahr. 1610;960(45): 2061-2068  (http://education.[facility].com/faq/FAQ164)    HDL  Date Value Ref Range Status  10/18/2022 48 (L) > OR = 50 mg/dL Final   Triglycerides  Date Value Ref Range Status  10/18/2022 170 (H) <150 mg/dL Final         Passed - Completed PHQ-2 or PHQ-9 in the last 360 days      Passed - Last BP in normal range    BP Readings from Last 1 Encounters:  11/15/22 112/74         Passed - Last Heart Rate in normal range    Pulse Readings from Last 1 Encounters:  11/15/22 100         Passed - Valid encounter within last 6 months    Recent Outpatient Visits           2 months ago Moderate episode of recurrent major depressive disorder [facility])   [facility] Della Goo F, FNP   3 months ago Moderate episode of recurrent major depressive disorder [facility])   [facility] Berniece Salines, FNP   4 months ago Essential hypertension   [facility] Della Goo F, FNP              Passed - CBC within normal limits and completed in the last 12 months    WBC  Date Value Ref Range Status  10/18/2022 6.8 3.8 - 10.8 Thousand/uL Final   RBC  Date Value Ref Range Status  10/18/2022 4.86 3.80 - 5.10 Million/uL Final   Hemoglobin  Date Value Ref Range Status  10/18/2022 12.9 11.7 - 15.5 g/dL Final   HGB  Date Value Ref Range Status  09/13/2013 12.7 12.0 - 16.0 g/dL Final   HCT  Date Value Ref Range Status  10/18/2022 38.7 35.0 - 45.0 % Final  09/14/2013 33.6 (L) 35.0 - 47.0 % Final   MCHC  Date Value Ref Range Status  10/18/2022 33.3 32.0 - 36.0 g/dL Final   [facility]  Date Value Ref Range Status  10/18/2022 26.5 (L) 27.0 - 33.0 pg Final   MCV  Date Value Ref Range Status  10/18/2022 79.6 (L) 80.0 - 100.0 fL Final  09/13/2013 81 80 -  100 fL Final   No results found for: "PLTCOUNTKUC", "LABPLAT", "POCPLA" RDW  Date Value Ref Range Status  10/18/2022 13.3 11.0 - 15.0 % Final  09/13/2013 14.5 11.5 - 14.5 % Final         Passed - CMP within normal limits and completed in the last 12 months    Albumin  Date Value Ref Range Status  02/16/2022 4.3 3.5 - 5.0 g/dL Final   Alkaline Phosphatase  Date Value Ref Range Status  02/16/2022 77 38 - 126 U/L Final   Alkaline phosphatase (APISO)  Date Value Ref Range Status  10/18/2022 84 31 - 125 U/L Final   ALT  Date Value Ref Range Status  10/18/2022 20 6 - 29 U/L Final   AST  Date Value Ref Range Status  10/18/2022 15 10 - 30 U/L Final   BUN  Date Value Ref Range Status  10/18/2022 13 7 - 25 mg/dL Final   Calcium  Date Value Ref Range Status  10/18/2022 9.7 8.6 - 10.2 mg/dL Final   Calcium, Ion  Date Value Ref Range Status  07/15/2019 1.15 1.15 - 1.40 mmol/L Final   CO2  Date Value Ref Range Status  10/18/2022 27 20 - 32 mmol/L Final    Bicarbonate  Date Value Ref Range Status  07/15/2019 23.4 20.0 - 28.0 mmol/L Final   TCO2  Date Value Ref Range Status  07/15/2019 25 22 - 32 mmol/L Final   Creat  Date Value Ref Range Status  10/18/2022 0.55 0.50 - 0.97 mg/dL Final   Creatinine, Urine  Date Value Ref Range Status  09/26/2015 160 mg/dL Final   Glucose, Bld  Date Value Ref Range Status  10/18/2022 97 65 - 99 mg/dL Final    Comment:    .            Fasting reference interval .    Potassium  Date Value Ref Range Status  10/18/2022 4.4 3.5 - 5.3 mmol/L Final   Sodium  Date Value Ref Range Status  10/18/2022 138 135 - 146 mmol/L Final   Total Bilirubin  Date Value Ref Range Status  10/18/2022 0.5 0.2 - 1.2 mg/dL Final   Protein, ur  Date Value Ref Range Status  07/17/2019 NEGATIVE NEGATIVE mg/dL Final   Total Protein, Urine  Date Value Ref Range Status  09/26/2015 43 mg/dL Final    Comment:    NO NORMAL RANGE ESTABLISHED FOR THIS TEST   Total Protein  Date Value Ref Range Status  10/18/2022 6.9 6.1 - 8.1 g/dL Final   GFR calc Af Amer  Date Value Ref Range Status  07/18/2019 >60 >60 mL/min Final   eGFR  Date Value Ref Range Status  10/18/2022 123 > OR = 60 mL/min/1.40m2 Final   GFR, Estimated  Date Value Ref Range Status  02/16/2022 >60 >60 mL/min Final    Comment:    (NOTE) Calculated using the CKD-EPI Creatinine Equation (2021)           sertraline (ZOLOFT) 100 MG tablet 90 tablet 0    Sig: Take 1 tablet (100 mg total) by mouth daily.     Psychiatry:  Antidepressants - SSRI - sertraline Passed - 01/18/2023 12:34 PM      Passed - AST in normal range and within 360 days    AST  Date Value Ref Range Status  10/18/2022 15 10 - 30 U/L Final         Passed - ALT in normal range  and within 360 days    ALT  Date Value Ref Range Status  10/18/2022 20 6 - 29 U/L Final         Passed - Completed PHQ-2 or PHQ-9 in the last 360 days      Passed - Valid encounter within last 6  months    Recent Outpatient Visits           2 months ago Moderate episode of recurrent major depressive disorder [facility])   [facility] Della Goo F, FNP   3 months ago Moderate episode of recurrent major depressive disorder [facility])   [facility] Berniece Salines, FNP   4 months ago Essential hypertension   [facility] Berniece Salines, [place]

## 2023-01-21 ENCOUNTER — Other Ambulatory Visit: Payer: Self-pay

## 2023-01-21 MED FILL — Aripiprazole Tab 2 MG: ORAL | 30 days supply | Qty: 30 | Fill #0 | Status: AC

## 2023-01-22 ENCOUNTER — Other Ambulatory Visit: Payer: Self-pay

## 2023-01-24 ENCOUNTER — Encounter: Payer: Self-pay | Admitting: Nurse Practitioner

## 2023-01-28 ENCOUNTER — Ambulatory Visit: Payer: Self-pay | Admitting: Surgery

## 2023-01-28 NOTE — H&P (View-Only) (Signed)
Jessica Espinoza [id number]  Referring Provider: Self  Subjective  Chief Complaint: New Weight Loss   History of Present Illness:  Very pleasant 37-year-old woman with a history of hypertension, ADHD who presents for consultation regarding surgical treatment of morbid obesity. She states that she has always been heavier than most people, but really did not start to see issues with obesity until having her children which she has 3. She has tried multiple methods of weight loss including Saxenda, low calorie diet, keto, exercise programs, etc. and has been able to lose anywhere from 10 to 40 pounds but this is ultimately regained. Several acquaintances who have undergone bariatric surgery with varying success and is interested in discussing the sleeve gastrectomy. Her goal is to improve her health, treat her hypertension and avoid possible sequelae of that including stroke of which she is lost several family members to this. 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 Denies any tobacco or significant alcohol use, no drug use. No previous abdominal surgeries. Denies any significant reflux. UGI negative.   Review of Systems: A complete review of systems was obtained from the patient. I have reviewed this information and discussed as appropriate with the patient. See HPI as well for other ROS.  Medical History: Past Medical History: Diagnosis Date Anxiety Depression Hypertension IBS (irritable bowel syndrome) Migraines Miscarriage  Patient Active Problem List Diagnosis Obesity (BMI 35.0-39.9 without comorbidity), unspecified Mild depression Generalized anxiety disorder Irritable bowel syndrome Essential hypertension Pelvic pain in female Vitamin D deficiency Adult ADHD  Past Surgical History: Procedure Laterality Date DILATATION AND CURETTAGE /HYSTEROSCOPY, ENDOMETRIAL POLYPECTOMY 04/05/2022 Dr. Stephen Jackson   Allergies Allergen  Reactions Mustard Unknown  Current Outpatient Medications on File Prior to Visit Medication Sig Dispense Refill aluminum chloride (DRYSOL) 20 % external solution Apply solution sparingly topically once weekly. 60 mL 1 cetirizine (ZYRTEC) 10 MG tablet Take 10 mg by mouth once daily cholecalciferol (VITAMIN D3) 2,000 unit tablet Take 2,000 Units by mouth once daily EPINEPHrine (EPIPEN) 0.3 mg/0.3 mL auto-injector INJECT 0.3MG INTO THE MUSCLE ONCE FOR 1 DOSE AS NEEDED liraglutide, weight loss, (SAXENDA) 3 mg/0.5 mL (18 mg/3 mL) pen injector Inject 3 mg subcutaneously once daily 15 mL 3 multivitamin tablet Take 1 tablet by mouth once daily olmesartan (BENICAR) 40 MG tablet Take 1 tablet (40 mg total) by mouth once daily 90 tablet 0 sertraline (ZOLOFT) 50 MG tablet Take 1 tablet (50 mg total) by mouth once daily 90 tablet 0 UNIFINE PENTIPS 31 gauge x 3/16" needle Use as directed for Saxenda 100 each 0 VYVANSE 50 mg capsule 60 mg  No current facility-administered medications on file prior to visit.  Family History Problem Relation Age of Onset Thyroid disease Mother Irritable bowel syndrome Mother No Known Problems Father Diabetes type II Maternal Grandmother Osteoporosis (Thinning of bones) Maternal Grandmother Cataracts Maternal Grandmother Leukemia Other Schizophrenia Other Bipolar disorder Other Polycystic ovary syndrome Other ADD / ADHD Daughter No Known Problems Son No Known Problems Son No Known Problems Maternal Grandfather   Social History  Tobacco Use Smoking Status Former Types: Cigarettes Smokeless Tobacco Never   Social History  Socioeconomic History Marital status: Married Tobacco Use Smoking status: Former Types: Cigarettes Smokeless tobacco: Never Vaping Use Vaping Use: Never used Substance and Sexual Activity Alcohol use: No Drug use: No Sexual activity: Yes Partners: Male Birth control/protection: None  Objective:  Vitals: 11/16/22 1413 BP:  128/80 Pulse: 86 Temp: 36.9 C (98.4 F) SpO2: 98%   Weight: (!) 106 kg (233 lb 9.6 oz) Height: 162.6 cm (5' 4") PainSc: 0-No pain  Body mass index is 40.1 kg/m.  Gen: A&Ox3, no distress Unlabored respirations  Labs, Imaging and Diagnostic Testing: CMP, lipid panel, CBC, hemoglobin A1c done on 10/18/2022 in [facility] epic; unremarkable  Assessment and Plan: Diagnoses and all orders for this visit:  Morbid obesity (CMS-HCC) Comments: BMI 30=175lb  Attention deficit hyperactivity disorder (ADHD), unspecified ADHD type  Anxiety  History of depression  Essential hypertension  Hepatic steatosis Comments: Noted on CT July 17, 2019   She is an excellent candidate for sleeve gastrectomy. We discussed the surgery including technical aspects, the risks of bleeding, infection, pain, scarring, injury to intra-abdominal structures, staple line leak or abscess, chronic abdominal pain or nausea, new onset or worsened GERD, DVT/PE, pneumonia, heart attack, stroke, death, failure to reach weight loss goals and weight regain, hernia. Discussed the typical pre-, peri-, and postoperative course. Discussed the importance of lifelong behavioral changes to combat the chronic and relapsing disease which is obesity. She had several insightful questions which were answered to her satisfaction. We will initiate the pathway towards sleeve gastrectomy.  Shonique Pelphrey AMANDA Mickelle Goupil, MD  

## 2023-01-28 NOTE — H&P (Signed)
Jessica Espinoza [id number]  Referring Provider: Self  Subjective  Chief Complaint: New Weight Loss   History of Present Illness:  Very pleasant 37 year old woman with a history of hypertension, ADHD who presents for consultation regarding surgical treatment of morbid obesity. She states that she has always been heavier than most people, but really did not start to see issues with obesity until having her children which she has 3. She has tried multiple methods of weight loss including Saxenda, low calorie diet, keto, exercise programs, etc. and has been able to lose anywhere from 10 to 40 pounds but this is ultimately regained. Several acquaintances who have undergone bariatric surgery with varying success and is interested in discussing the sleeve gastrectomy. Her goal is to improve her health, treat her hypertension and avoid possible sequelae of that including stroke of which she is lost several family members to this. 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 Denies any tobacco or significant alcohol use, no drug use. No previous abdominal surgeries. Denies any significant reflux. UGI negative.   Review of Systems: A complete review of systems was obtained from the patient. I have reviewed this information and discussed as appropriate with the patient. See HPI as well for other ROS.  Medical History: Past Medical History: Diagnosis Date Anxiety Depression Hypertension IBS (irritable bowel syndrome) Migraines Miscarriage  Patient Active Problem List Diagnosis Obesity (BMI 35.0-39.9 without comorbidity), unspecified Mild depression Generalized anxiety disorder Irritable bowel syndrome Essential hypertension Pelvic pain in female Vitamin D deficiency Adult ADHD  Past Surgical History: Procedure Laterality Date DILATATION AND CURETTAGE /HYSTEROSCOPY, ENDOMETRIAL POLYPECTOMY 04/05/2022 Dr. Thomasene Mohair   Allergies Allergen  Reactions Mustard Unknown  Current Outpatient Medications on File Prior to Visit Medication Sig Dispense Refill aluminum chloride (DRYSOL) 20 % external solution Apply solution sparingly topically once weekly. 60 mL 1 cetirizine (ZYRTEC) 10 MG tablet Take 10 mg by mouth once daily cholecalciferol (VITAMIN D3) 2,000 unit tablet Take 2,000 Units by mouth once daily EPINEPHrine (EPIPEN) 0.3 mg/0.3 mL auto-injector INJECT 0.3MG  INTO THE MUSCLE ONCE FOR 1 DOSE AS NEEDED liraglutide, weight loss, (SAXENDA) 3 mg/0.5 mL (18 mg/3 mL) pen injector Inject 3 mg subcutaneously once daily 15 mL 3 multivitamin tablet Take 1 tablet by mouth once daily olmesartan (BENICAR) 40 MG tablet Take 1 tablet (40 mg total) by mouth once daily 90 tablet 0 sertraline (ZOLOFT) 50 MG tablet Take 1 tablet (50 mg total) by mouth once daily 90 tablet 0 UNIFINE PENTIPS 31 gauge x 3/16" needle Use as directed for Saxenda 100 each 0 VYVANSE 50 mg capsule 60 mg  No current facility-administered medications on file prior to visit.  Family History Problem Relation Age of Onset Thyroid disease Mother Irritable bowel syndrome Mother No Known Problems Father Diabetes type II Maternal Grandmother Osteoporosis (Thinning of bones) Maternal Grandmother Cataracts Maternal Grandmother Leukemia Other Schizophrenia Other Bipolar disorder Other Polycystic ovary syndrome Other ADD / ADHD Daughter No Known Problems Son No Known Problems Son No Known Problems Maternal Grandfather   Social History  Tobacco Use Smoking Status Former Types: Cigarettes Smokeless Tobacco Never   Social History  Socioeconomic History Marital status: Married Tobacco Use Smoking status: Former Types: Cigarettes Smokeless tobacco: Never Vaping Use Vaping Use: Never used Substance and Sexual Activity Alcohol use: No Drug use: No Sexual activity: Yes Partners: Male Birth control/protection: None  Objective:  Vitals: 11/16/22 1413 BP:  128/80 Pulse: 86 Temp: 36.9 C (98.4 F) SpO2: 98%  Weight: (!) 106 kg (233 lb 9.6 oz) Height: 162.6 cm (5\' 4" ) PainSc: 0-No pain  Body mass index is 40.1 kg/m.  Gen: A&Ox3, no distress Unlabored respirations  Labs, Imaging and Diagnostic Testing: CMP, lipid panel, CBC, hemoglobin A1c done on 10/18/2022 in [facility] epic; unremarkable  Assessment and Plan: Diagnoses and all orders for this visit:  Morbid obesity (CMS-HCC) Comments: BMI 30=175lb  Attention deficit hyperactivity disorder (ADHD), unspecified ADHD type  Anxiety  History of depression  Essential hypertension  Hepatic steatosis Comments: Noted on CT July 17, 2019   She is an excellent candidate for sleeve gastrectomy. We discussed the surgery including technical aspects, the risks of bleeding, infection, pain, scarring, injury to intra-abdominal structures, staple line leak or abscess, chronic abdominal pain or nausea, new onset or worsened GERD, DVT/PE, pneumonia, heart attack, stroke, death, failure to reach weight loss goals and weight regain, hernia. Discussed the typical pre-, peri-, and postoperative course. Discussed the importance of lifelong behavioral changes to combat the chronic and relapsing disease which is obesity. She had several insightful questions which were answered to her satisfaction. We will initiate the pathway towards sleeve gastrectomy.  Lakrista Scaduto Carlye Grippe, MD

## 2023-01-31 ENCOUNTER — Ambulatory Visit (INDEPENDENT_AMBULATORY_CARE_PROVIDER_SITE_OTHER): Payer: 59 | Admitting: Nurse Practitioner

## 2023-01-31 ENCOUNTER — Encounter: Payer: Self-pay | Admitting: Nurse Practitioner

## 2023-01-31 ENCOUNTER — Other Ambulatory Visit: Payer: Self-pay

## 2023-01-31 VITALS — BP 122/72 | HR 74 | Temp 98.0°F | Resp 16 | Ht 64.0 in | Wt 235.2 lb

## 2023-01-31 DIAGNOSIS — G8929 Other chronic pain: Secondary | ICD-10-CM

## 2023-01-31 DIAGNOSIS — M25511 Pain in right shoulder: Secondary | ICD-10-CM | POA: Diagnosis not present

## 2023-01-31 MED ORDER — CYCLOBENZAPRINE HCL 10 MG PO TABS
10.0000 mg | ORAL_TABLET | Freq: Three times a day (TID) | ORAL | 0 refills | Status: DC | PRN
  Filled 2023-01-31 (×2): qty 30, 10d supply, fill #0
  Filled 2023-01-31: qty 10, 3d supply, fill #0
  Filled 2023-01-31: qty 30, 10d supply, fill #0

## 2023-01-31 NOTE — Progress Notes (Signed)
BP 122/72   Pulse 74   Temp 98 F (36.7 C) (Oral)   Resp 16   Ht 5\' 4"  (1.626 m)   Wt 235 lb 3.2 oz (106.7 kg)   SpO2 98%   BMI 40.37 kg/m    Subjective:    Patient ID: Jessica Espinoza, female    DOB: [birth year], 37 y.o.   MRN: [id number]  HPI: Jessica Espinoza is a 37 y.o. female  Chief Complaint  Patient presents with   Shoulder Pain    Right shoulder pain intermittent for months   Right shoulder pain:  patient reports that she has had right shoulder pain for about a week this time.  She denies any trauma.  She was seen for this previously on 09/20/2022. At that time she reported she had right shoulder pain off and on for years.  She says that she takes muscle relaxer for some relief and that she was going to the chiropractor but has been in awhile.  She says she has been using red light therapy. She says she has been taking ibuprofen for the pain.  Will refill flexeril and refer her to ortho.   Relevant past medical, surgical, family and social history reviewed and updated as indicated. Interim medical history since our last visit reviewed. Allergies and medications reviewed and updated.  Review of Systems  Constitutional: Negative for fever or weight change.  Respiratory: Negative for cough and shortness of breath.   Cardiovascular: Negative for chest pain or palpitations.  Gastrointestinal: Negative for abdominal pain, no bowel changes.  Musculoskeletal: Negative for gait problem or joint swelling. Positive for right shoulder pain Skin: Negative for rash.  Neurological: Negative for dizziness or headache.  No other specific complaints in a complete review of systems (except as listed in HPI above).      Objective:    BP 122/72   Pulse 74   Temp 98 F (36.7 C) (Oral)   Resp 16   Ht 5\' 4"  (1.626 m)   Wt 235 lb 3.2 oz (106.7 kg)   SpO2 98%   BMI 40.37 kg/m   Wt Readings from Last 3 Encounters:  01/31/23 235 lb 3.2 oz (106.7 kg)  11/23/22 231 lb 6.4 oz (105  kg)  11/15/22 231 lb 14.4 oz (105.2 kg)    Physical Exam  Constitutional: Patient appears well-developed and well-nourished. Obese  No distress.  HEENT: head atraumatic, normocephalic, pupils equal and reactive to light, neck supple Cardiovascular: Normal rate, regular rhythm and normal heart sounds.  No murmur heard. No BLE edema. Pulmonary/Chest: Effort normal and breath sounds normal. No respiratory distress. Abdominal: Soft.  There is no tenderness. MSK: decrease ROM right shoulder Psychiatric: Patient has a normal mood and affect. behavior is normal. Judgment and thought content normal.  Assessment & Plan:   Problem List Items Addressed This Visit       Other   Chronic right shoulder pain - Primary    Right shoulder pain off and on for several years.  She is never seen orthopedic for it.  She has seen chiropractor which did help occasionally.  She is also been taking ibuprofen and needed a refill of her Flexeril.  With patient that she should be evaluated by orthopedics patient agreed referral placed.      Relevant Medications   cyclobenzaprine (FLEXERIL) 10 MG tablet   Other Relevant Orders   Ambulatory referral to Orthopedics     Follow up plan: Return if symptoms worsen or  fail to improve.

## 2023-01-31 NOTE — Assessment & Plan Note (Signed)
Right shoulder pain off and on for several years.  She is never seen orthopedic for it.  She has seen chiropractor which did help occasionally.  She is also been taking ibuprofen and needed a refill of her Flexeril.  With patient that she should be evaluated by orthopedics patient agreed referral placed.

## 2023-02-01 DIAGNOSIS — F419 Anxiety disorder, unspecified: Secondary | ICD-10-CM | POA: Diagnosis not present

## 2023-02-01 DIAGNOSIS — I10 Essential (primary) hypertension: Secondary | ICD-10-CM | POA: Diagnosis not present

## 2023-02-01 DIAGNOSIS — Z8659 Personal history of other mental and behavioral disorders: Secondary | ICD-10-CM | POA: Diagnosis not present

## 2023-02-01 DIAGNOSIS — K760 Fatty (change of) liver, not elsewhere classified: Secondary | ICD-10-CM | POA: Diagnosis not present

## 2023-02-01 DIAGNOSIS — F909 Attention-deficit hyperactivity disorder, unspecified type: Secondary | ICD-10-CM | POA: Diagnosis not present

## 2023-02-04 ENCOUNTER — Encounter: Payer: Self-pay | Admitting: Dietician

## 2023-02-04 ENCOUNTER — Encounter: Payer: 59 | Attending: Surgery | Admitting: Dietician

## 2023-02-04 VITALS — Ht 64.0 in | Wt 234.9 lb

## 2023-02-04 DIAGNOSIS — E669 Obesity, unspecified: Secondary | ICD-10-CM

## 2023-02-04 NOTE — Progress Notes (Signed)
Pre-Operative Nutrition Class:    Patient was seen on 02/04/2023 for Pre-Operative Bariatric Surgery Education at [facility].    Surgery date:  Surgery type: Sleeve Gastrectomy  Anthropometrics  Start weight at [facility]: 231.4 lbs (date: 11/23/2022)  Height: 64 in Weight today: 234.9 lbs. BMI: 40.32 kg/m2     Clinical  Pharmacotherapy: History of weight loss medication used: [facility] hx: HTN Medications: losartan, vyvanse, sertraline, amplilify, Vit D, multivitamin, probiotic  Labs: HDL 48; triglycerides 170; glucose 104 Notable signs/symptoms: none noted Any previous deficiencies? No  Samples given per MNT protocol. Patient educated on appropriate usage: [employer] Multivitamin Lot # [id number] Exp: 06/26   [employer] Calcium  Lot # [id number] Exp: 03/25  The following the learning objectives were met by the patient during this course: Identify Pre-Op Dietary Goals and will begin 2 weeks pre-operatively Identify appropriate sources of fluids and proteins  State protein recommendations and appropriate sources pre and post-operatively Identify Post-Operative Dietary Goals and will follow for 2 weeks post-operatively Identify appropriate multivitamin and calcium sources Describe the need for physical activity post-operatively and will follow MD recommendations State when to call healthcare provider regarding medication questions or post-operative complications When having a diagnosis of diabetes understanding hypoglycemia symptoms and the inclusion of 1 complex carbohydrate per meal  Handouts given during class include: Pre-Op Bariatric Surgery Diet Handout Protein Shake Handout Post-Op Bariatric Surgery Nutrition Handout BEL[facility] Program Information Flyer Support Group Information Flyer [facility] Bariatric Supplements Price List  Follow-Up Plan: Patient will follow-up at [facility] 2 weeks post operatively for diet advancement  per MD.

## 2023-02-06 NOTE — Patient Instructions (Signed)
SURGICAL WAITING ROOM VISITATION Patients having surgery or a procedure may have no more than 2 support people in the waiting area - these visitors may rotate in the visitor waiting room.   Due to an increase in RSV and influenza rates and associated hospitalizations, children ages 62 and under may not visit patients in [facility] hospitals. If the patient needs to stay at the hospital during part of their recovery, the visitor guidelines for inpatient rooms apply.  PRE-OP VISITATION  Pre-op nurse will coordinate an appropriate time for 1 support person to accompany the patient in pre-op.  This support person may not rotate.  This visitor will be contacted when the time is appropriate for the visitor to come back in the pre-op area.  Please refer to the [facility] website for the visitor guidelines for Inpatients (after your surgery is over and you are in a regular room).  You are not required to quarantine at this time prior to your surgery. However, you must do this: Hand Hygiene often Do NOT share personal items Notify your provider if you are in close contact with someone who has COVID or you develop fever 100.4 or greater, new onset of sneezing, cough, sore throat, shortness of breath or body aches.  If you test positive for Covid or have been in contact with anyone that has tested positive in the last 10 days please notify you surgeon.    Your procedure is scheduled on:  Monday  Feb 18, 2023  Report to [facility] Main Entrance: [address] [facility] entrance where the [employer] is available.   Report to admitting at:  10:15 AM  +++++Call this number if you have any questions or problems the morning of surgery [phone]  Do not eat food after Midnight the night prior to your surgery/procedure.  After Midnight you may have the following liquids until  09:30  AM DAY OF SURGERY  Clear Liquid Diet Water Black Coffee (sugar ok, NO MILK/CREAM OR CREAMERS)  Tea (sugar ok, NO  MILK/CREAM OR CREAMERS) regular and decaf                             Plain Jell-O  with no fruit (NO RED)                                           Fruit ices (not with fruit pulp, NO RED)                                     Popsicles (NO RED)                                                                  Juice: apple, WHITE grape, WHITE cranberry Sports drinks like Gatorade or Powerade (NO RED)                    The day of surgery:  Drink ONE (1) Pre-Surgery G2 at 09:30 AM the morning of surgery. Drink in one sitting. Do not sip.  This drink was given to you during your hospital pre-op appointment visit. Nothing else to drink after completing the Pre-Surgery  G2 : No candy, chewing gum or throat lozenges.    FOLLOW BOWEL PREP AND ANY ADDITIONAL PRE OP INSTRUCTIONS YOU RECEIVED FROM YOUR SURGEON'S OFFICE!!!   Oral Hygiene is also important to reduce your risk of infection.        Remember - BRUSH YOUR TEETH THE MORNING OF SURGERY WITH YOUR REGULAR TOOTHPASTE  Do NOT smoke after Midnight the night before surgery.  Take ONLY these medicines the morning of surgery with A SIP OF WATER: Aripiprazole (Abilify), sertraline (Zoloft)                    You may not have any metal on your body including hair pins, jewelry, and body piercing  Do not wear make-up, lotions, powders, perfumes or deodorant  Do not wear nail polish including gel and S&S, artificial / acrylic nails, or any other type of covering on natural nails including finger and toenails. If you have artificial nails, gel coating, etc., that needs to be removed by a nail salon, Please have this removed prior to surgery. Not doing so may mean that your surgery could be cancelled or delayed if the Surgeon or anesthesia staff feels like they are unable to monitor you safely.   Do not shave 48 hours prior to surgery to avoid nicks in your skin which may contribute to postoperative infections.   Contacts, Hearing Aids, dentures  or bridgework may not be worn into surgery. DENTURES WILL BE REMOVED PRIOR TO SURGERY PLEASE DO NOT APPLY "Poly grip" OR ADHESIVES!!!  You may bring a small overnight bag with you on the day of surgery, only pack items that are not valuable. [facility] IS NOT RESPONSIBLE   FOR VALUABLES THAT ARE LOST OR STOLEN.   Do not bring your home medications to the hospital. The Pharmacy will dispense medications listed on your medication list to you during your admission in the Hospital.  Special Instructions: Bring a copy of your healthcare power of attorney and living will documents the day of surgery, if you wish to have them scanned into your [facility]- EPIC  Please read over the following fact sheets you were given: IF YOU HAVE QUESTIONS ABOUT YOUR PRE-OP INSTRUCTIONS, PLEASE CALL [phone].   [facility] - Preparing for Surgery Before surgery, you can play an important role.  Because skin is not sterile, your skin needs to be as free of germs as possible.  You can reduce the number of germs on your skin by washing with CHG (chlorahexidine gluconate) soap before surgery.  CHG is an antiseptic cleaner which kills germs and bonds with the skin to continue killing germs even after washing. Please DO NOT use if you have an allergy to CHG or antibacterial soaps.  If your skin becomes reddened/irritated stop using the CHG and inform your nurse when you arrive at Short Stay. Do not shave (including legs and underarms) for at least 48 hours prior to the first CHG shower.  You may shave your face/neck.  Please follow these instructions carefully:  1.  Shower with CHG Soap the night before surgery and the  morning of surgery.  2.  If you choose to wash your hair, wash your hair first as usual with your normal  shampoo.  3.  After you shampoo, rinse your hair and body thoroughly to remove the shampoo.  4.  Use CHG as you would any other liquid soap.  You can apply  chg directly to the skin and wash.  Gently with a scrungie or clean washcloth.  5.  Apply the CHG Soap to your body ONLY FROM THE NECK DOWN.   Do not use on face/ open                           Wound or open sores. Avoid contact with eyes, ears mouth and genitals (private parts).                       Wash face,  Genitals (private parts) with your normal soap.             6.  Wash thoroughly, paying special attention to the area where your  surgery  will be performed.  7.  Thoroughly rinse your body with warm water from the neck down.  8.  DO NOT shower/wash with your normal soap after using and rinsing off the CHG Soap.            9.  Pat yourself dry with a clean towel.            10.  Wear clean pajamas.            11.  Place clean sheets on your bed the night of your first shower and do not  sleep with pets.  ON THE DAY OF SURGERY : Do not apply any lotions/deodorants the morning of surgery.  Please wear clean clothes to the hospital/surgery center.    FAILURE TO FOLLOW THESE INSTRUCTIONS MAY RESULT IN THE CANCELLATION OF YOUR SURGERY  PATIENT SIGNATURE_________________________________  NURSE SIGNATURE__________________________________  ________________________________________________________________________     .   Incentive Spirometer    An incentive spirometer is a tool that can help keep your lungs clear and active. This tool measures how well you are filling your lungs with each breath. Taking long deep breaths may help reverse or decrease the chance of developing breathing (pulmonary) problems (especially infection) following: A long period of time when you are unable to move or be active. BEFORE THE PROCEDURE  If the spirometer includes an indicator to show your best effort, your nurse or respiratory therapist will set it to a desired goal. If possible, sit up straight or lean slightly forward. Try not to slouch. Hold the incentive spirometer in an upright  position. INSTRUCTIONS FOR USE  Sit on the edge of your bed if possible, or sit up as far as you can in bed or on a chair. Hold the incentive spirometer in an upright position. Breathe out normally. Place the mouthpiece in your mouth and seal your lips tightly around it. Breathe in slowly and as deeply as possible, raising the piston or the ball toward the top of the column. Hold your breath for 3-5 seconds or for as long as possible. Allow the piston or ball to fall to the bottom of the column. Remove the mouthpiece from your mouth and breathe out normally. Rest for a few seconds and repeat Steps 1 through 7 at least 10 times every 1-2 hours when you are awake. Take your time and take a few normal breaths between deep breaths. The spirometer may include an indicator to show your best effort. Use the indicator as a goal to work toward during each repetition. After each set of 10 deep breaths, practice coughing to  be sure your lungs are clear. If you have an incision (the cut made at the time of surgery), support your incision when coughing by placing a pillow or rolled up towels firmly against it. Once you are able to get out of bed, walk around indoors and cough well. You may stop using the incentive spirometer when instructed by your caregiver.  RISKS AND COMPLICATIONS Take your time so you do not get dizzy or light-headed. If you are in pain, you may need to take or ask for pain medication before doing incentive spirometry. It is harder to take a deep breath if you are having pain. AFTER USE Rest and breathe slowly and easily. It can be helpful to keep track of a log of your progress. Your caregiver can provide you with a simple table to help with this. If you are using the spirometer at home, follow these instructions: SEEK MEDICAL CARE IF:  You are having difficultly using the spirometer. You have trouble using the spirometer as often as instructed. Your pain medication is not giving  enough relief while using the spirometer. You develop fever of 100.5 F (38.1 C) or higher.                                                                                                    SEEK IMMEDIATE MEDICAL CARE IF:  You cough up bloody sputum that had not been present before. You develop fever of 102 F (38.9 C) or greater. You develop worsening pain at or near the incision site. MAKE SURE YOU:  Understand these instructions. Will watch your condition. Will get help right away if you are not doing well or get worse. Document Released: 01/28/2007 Document Revised: 12/10/2011 Document Reviewed: 03/31/2007 [facility] Patient Information 2014 [place], [place].      WHAT IS A BLOOD TRANSFUSION? Blood Transfusion Information  A transfusion is the replacement of blood or some of its parts. Blood is made up of multiple cells which provide different functions. Red blood cells carry oxygen and are used for blood loss replacement. White blood cells fight against infection. Platelets control bleeding. Plasma helps clot blood. Other blood products are available for specialized needs, such as hemophilia or other clotting disorders. BEFORE THE TRANSFUSION  Who gives blood for transfusions?  Healthy volunteers who are fully evaluated to make sure their blood is safe. This is blood bank blood. Transfusion therapy is the safest it has ever been in the practice of medicine. Before blood is taken from a donor, a complete history is taken to make sure that person has no history of diseases nor engages in risky social behavior (examples are intravenous drug use or sexual activity with multiple partners). The donor's travel history is screened to minimize risk of transmitting infections, such as malaria. The donated blood is tested for signs of infectious diseases, such as HIV and hepatitis. The blood is then tested to be sure it is compatible with you in order to minimize the chance of a transfusion  reaction. If you or a relative donates blood, this is often done in anticipation  of surgery and is not appropriate for emergency situations. It takes many days to process the donated blood. RISKS AND COMPLICATIONS Although transfusion therapy is very safe and saves many lives, the main dangers of transfusion include:  Getting an infectious disease. Developing a transfusion reaction. This is an allergic reaction to something in the blood you were given. Every precaution is taken to prevent this. The decision to have a blood transfusion has been considered carefully by your caregiver before blood is given. Blood is not given unless the benefits outweigh the risks. AFTER THE TRANSFUSION Right after receiving a blood transfusion, you will usually feel much better and more energetic. This is especially true if your red blood cells have gotten low (anemic). The transfusion raises the level of the red blood cells which carry oxygen, and this usually causes an energy increase. The nurse administering the transfusion will monitor you carefully for complications. HOME CARE INSTRUCTIONS  No special instructions are needed after a transfusion. You may find your energy is better. Speak with your caregiver about any limitations on activity for underlying diseases you may have. SEEK MEDICAL CARE IF:  Your condition is not improving after your transfusion. You develop redness or irritation at the intravenous (IV) site. SEEK IMMEDIATE MEDICAL CARE IF:  Any of the following symptoms occur over the next 12 hours: Shaking chills. You have a temperature by mouth above 102 F (38.9 C), not controlled by medicine. Chest, back, or muscle pain. People around you feel you are not acting correctly or are confused. Shortness of breath or difficulty breathing. Dizziness and fainting. You get a rash or develop hives. You have a decrease in urine output. Your urine turns a dark color or changes to pink, red, or  brown. Any of the following symptoms occur over the next 10 days: You have a temperature by mouth above 102 F (38.9 C), not controlled by medicine. Shortness of breath. Weakness after normal activity. The white part of the eye turns yellow (jaundice). You have a decrease in the amount of urine or are urinating less often. Your urine turns a dark color or changes to pink, red, or brown. Document Released: 09/14/2000 Document Revised: 12/10/2011 Document Reviewed: 05/03/2008 [facility] Patient Information 2014 [place], [place].  _______________________________________________________________________

## 2023-02-06 NOTE — Progress Notes (Signed)
COVID Vaccine received:  []  No [x]  Yes Date of any COVID positive Test in last 90 days:  PCP - Della Goo, FNP Cardiologist -   Chest x-ray - 12-14-2022  2v  Epic EKG - 12-14-2022  Epic  Stress Test -  ECHO -  Cardiac Cath -   PCR screen: []  Ordered & Completed           []   No Order but Needs PROFEND           [x]   N/A for this surgery  Surgery Plan:  []  Ambulatory                            []  Outpatient in bed                            [x]  Admit  Anesthesia:    [x]  General  []  Spinal                           []   Choice []   MAC  Bowel Prep - []  No  [x]   Yes Bariatric diet  Pacemaker / ICD device [x]  No []  Yes   Spinal Cord Stimulator:[x]  No []  Yes       History of Sleep Apnea? [x]  No []  Yes   CPAP used?- [x]  No []  Yes    Does the patient monitor blood sugar?          []  No []  Yes  [x]  N/A  Patient has: [x]  NO Hx DM   []  Pre-DM                 []  DM1  []   DM2 Does patient have a [employer] or [employer]? 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  Activity level: Patient is able / unable to climb a flight of stairs without difficulty; []  No CP  []  No SOB, but would have ___   Patient can / can not perform ADLs without assistance.   Anesthesia review: HTN, anxiety, ADHD, IBS, migraines, Fatty Liver  Patient denies shortness of breath, fever, cough and chest pain at PAT appointment.  Patient verbalized understanding and agreement to the Pre-Surgical Instructions that were given to them at this PAT appointment. Patient was also educated of the need to review these PAT instructions again prior her surgery.I reviewed the appropriate phone numbers to call if they have any and  questions or concerns.

## 2023-02-07 ENCOUNTER — Encounter (HOSPITAL_COMMUNITY)
Admission: RE | Admit: 2023-02-07 | Discharge: 2023-02-07 | Disposition: A | Discharge status: Home / Self Care | Payer: 59 | Source: Ambulatory Visit | Attending: Surgery | Admitting: Surgery

## 2023-02-07 ENCOUNTER — Other Ambulatory Visit: Payer: Self-pay

## 2023-02-07 ENCOUNTER — Encounter (HOSPITAL_COMMUNITY): Payer: Self-pay

## 2023-02-07 DIAGNOSIS — Z01812 Encounter for preprocedural laboratory examination: Secondary | ICD-10-CM | POA: Diagnosis not present

## 2023-02-07 HISTORY — DX: Headache, unspecified: R51.9

## 2023-02-07 LAB — COMPREHENSIVE METABOLIC PANEL
ALT: 28 U/L (ref 0–44)
AST: 17 U/L (ref 15–41)
Albumin: 4.1 g/dL (ref 3.5–5.0)
Alkaline Phosphatase: 73 U/L (ref 38–126)
Anion gap: 7 (ref 5–15)
BUN: 7 mg/dL (ref 6–20)
CO2: 23 mmol/L (ref 22–32)
Calcium: 9.1 mg/dL (ref 8.9–10.3)
Chloride: 105 mmol/L (ref 98–111)
Creatinine, Ser: 0.47 mg/dL (ref 0.44–1.00)
GFR, Estimated: >60 mL/min (ref >60)
Glucose, Bld: 96 mg/dL (ref 70–99)
Potassium: 4.3 mmol/L (ref 3.5–5.1)
Sodium: 135 mmol/L (ref 135–145)
Total Bilirubin: 0.8 mg/dL (ref 0.3–1.2)
Total Protein: 7.1 g/dL (ref 6.5–8.1)

## 2023-02-07 LAB — CBC WITH DIFFERENTIAL/PLATELET
Abs Immature Granulocytes: 0.01 10*3/uL (ref 0.00–0.07)
Basophils Absolute: 0 10*3/uL (ref 0.0–0.1)
Basophils Relative: 1 %
Eosinophils Absolute: 0.1 10*3/uL (ref 0.0–0.5)
Eosinophils Relative: 2 %
HCT: 38.6 % (ref 36.0–46.0)
Hemoglobin: 12.3 g/dL (ref 12.0–15.0)
Immature Granulocytes: 0 %
Lymphocytes Relative: 23 %
Lymphs Abs: 1.2 10*3/uL (ref 0.7–4.0)
MCH: 26.5 pg (ref 26.0–34.0)
MCHC: 31.9 g/dL (ref 30.0–36.0)
MCV: 83 fL (ref 80.0–100.0)
Monocytes Absolute: 0.3 10*3/uL (ref 0.1–1.0)
Monocytes Relative: 7 %
Neutro Abs: 3.5 10*3/uL (ref 1.7–7.7)
Neutrophils Relative %: 67 %
Platelets: 231 10*3/uL (ref 150–400)
RBC: 4.65 MIL/uL (ref 3.87–5.11)
RDW: 13.3 % (ref 11.5–15.5)
WBC: 5.1 10*3/uL (ref 4.0–10.5)
nRBC: 0 % (ref 0.0–0.2)

## 2023-02-07 LAB — TYPE AND SCREEN
ABO/RH(D): A POS
Antibody Screen: NEGATIVE

## 2023-02-11 ENCOUNTER — Encounter: Payer: Self-pay | Admitting: Psychiatry

## 2023-02-11 ENCOUNTER — Ambulatory Visit (INDEPENDENT_AMBULATORY_CARE_PROVIDER_SITE_OTHER): Payer: 59 | Admitting: Psychiatry

## 2023-02-11 VITALS — BP 141/84 | HR 89 | Temp 98.0°F | Ht 64.0 in | Wt 232.6 lb

## 2023-02-11 DIAGNOSIS — F411 Generalized anxiety disorder: Secondary | ICD-10-CM | POA: Diagnosis not present

## 2023-02-11 DIAGNOSIS — F39 Unspecified mood [affective] disorder: Secondary | ICD-10-CM

## 2023-02-11 DIAGNOSIS — R41840 Attention and concentration deficit: Secondary | ICD-10-CM | POA: Diagnosis not present
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
## 2023-02-11 NOTE — Patient Instructions (Addendum)
Genesight Testing - [phone].  For therapy: www.openpathcollective.org  www.psychologytoday  [employer] [email or web address] [address] [place], [place] [address]  [phone]  [facility], [facility] [email or web address] [address] [place], [place] [address]   [phone]   [facility] - [phone]  [facility] - [phone]  [facility] - [phone] counseling [phone]  [facility] [phone]   [facility] this clinician can offer telehealth and has a sliding scale option  [email or web address]-gentry.info/ this group also offers sliding scale rates and is based out of [place]   [employer] [facility] has interns who offer sliding scale rates and some of the full time clinicians do, as well. You complete their contact form on their website and the referrals coordinator will help to get connected to someone

## 2023-02-12 ENCOUNTER — Encounter: Payer: Self-pay | Admitting: Psychiatry

## 2023-02-15 NOTE — Progress Notes (Signed)
Have attempted several times at various times throughout the day to reach pt in regards to surgical time change for Monday 02/18/2023. Have LVVM for return call back w/o response. Have also called pts spouse Cristal Deer per number listed in chart LVVM w/o response. Attempted to reach pts other contact friend Baxter Hire but unable to [facility] due to full mailbox. Pt unaware of surgical time change for Monday 02/18/2023.

## 2023-02-18 ENCOUNTER — Inpatient Hospital Stay (HOSPITAL_COMMUNITY): Payer: 59 | Admitting: Anesthesiology

## 2023-02-18 ENCOUNTER — Other Ambulatory Visit: Payer: Self-pay

## 2023-02-18 ENCOUNTER — Encounter (HOSPITAL_COMMUNITY): Payer: Self-pay | Admitting: Surgery

## 2023-02-18 ENCOUNTER — Encounter (HOSPITAL_COMMUNITY)
Admission: RE | Disposition: A | Discharge status: Home / Self Care | Payer: Self-pay | Source: Home / Self Care | Attending: Surgery

## 2023-02-18 ENCOUNTER — Inpatient Hospital Stay (HOSPITAL_COMMUNITY)
Admission: RE | Admit: 2023-02-18 | Discharge: 2023-02-19 | DRG: 621 | Disposition: A | Discharge status: Home / Self Care | Payer: 59 | Attending: Surgery | Admitting: Surgery

## 2023-02-18 DIAGNOSIS — F32A Depression, unspecified: Secondary | ICD-10-CM | POA: Diagnosis present

## 2023-02-18 DIAGNOSIS — I10 Essential (primary) hypertension: Secondary | ICD-10-CM

## 2023-02-18 DIAGNOSIS — F411 Generalized anxiety disorder: Secondary | ICD-10-CM | POA: Diagnosis not present

## 2023-02-18 DIAGNOSIS — Z91018 Allergy to other foods: Secondary | ICD-10-CM

## 2023-02-18 DIAGNOSIS — K760 Fatty (change of) liver, not elsewhere classified: Secondary | ICD-10-CM | POA: Diagnosis not present

## 2023-02-18 DIAGNOSIS — F909 Attention-deficit hyperactivity disorder, unspecified type: Secondary | ICD-10-CM | POA: Diagnosis not present

## 2023-02-18 DIAGNOSIS — J4489 Other specified chronic obstructive pulmonary disease: Secondary | ICD-10-CM | POA: Diagnosis present

## 2023-02-18 DIAGNOSIS — Z818 Family history of other mental and behavioral disorders: Secondary | ICD-10-CM | POA: Diagnosis not present

## 2023-02-18 DIAGNOSIS — E559 Vitamin D deficiency, unspecified: Secondary | ICD-10-CM | POA: Diagnosis present

## 2023-02-18 DIAGNOSIS — Z79899 Other long term (current) drug therapy: Secondary | ICD-10-CM | POA: Diagnosis not present

## 2023-02-18 DIAGNOSIS — J449 Chronic obstructive pulmonary disease, unspecified: Secondary | ICD-10-CM

## 2023-02-18 DIAGNOSIS — Z87891 Personal history of nicotine dependence: Secondary | ICD-10-CM

## 2023-02-18 DIAGNOSIS — Z6841 Body Mass Index (BMI) 40.0 and over, adult: Secondary | ICD-10-CM

## 2023-02-18 HISTORY — PX: UPPER GI ENDOSCOPY: SHX6162

## 2023-02-18 HISTORY — PX: LAPAROSCOPIC GASTRIC SLEEVE RESECTION: SHX5895

## 2023-02-18 LAB — POCT PREGNANCY, URINE: Preg Test, Ur: NEGATIVE

## 2023-02-18 SURGERY — GASTRECTOMY, SLEEVE, LAPAROSCOPIC
Anesthesia: General

## 2023-02-18 MED ORDER — HYDROMORPHONE HCL 1 MG/ML IJ SOLN: 0.5000 mg | INTRAMUSCULAR | Status: DC | PRN

## 2023-02-18 MED ORDER — PHENYLEPHRINE 80 MCG/ML (10ML) SYRINGE FOR IV PUSH (FOR BLOOD PRESSURE SUPPORT)
PREFILLED_SYRINGE | INTRAVENOUS | Status: AC
  Filled 2023-02-18: qty 10

## 2023-02-18 MED ORDER — LACTATED RINGERS IR SOLN
Status: DC | PRN
  Administered 2023-02-18: 1000 mL

## 2023-02-18 MED ORDER — PHENYLEPHRINE 80 MCG/ML (10ML) SYRINGE FOR IV PUSH (FOR BLOOD PRESSURE SUPPORT)
PREFILLED_SYRINGE | INTRAVENOUS | Status: DC | PRN
  Administered 2023-02-18: 160 ug via INTRAVENOUS
  Administered 2023-02-18: 80 ug via INTRAVENOUS

## 2023-02-18 MED ORDER — CHLORHEXIDINE GLUCONATE 4 % EX SOLN: 60.0000 mL | Freq: Once | CUTANEOUS | Status: DC

## 2023-02-18 MED ORDER — HYDRALAZINE HCL 20 MG/ML IJ SOLN: 10.0000 mg | INTRAMUSCULAR | Status: DC | PRN

## 2023-02-18 MED ORDER — EPHEDRINE SULFATE-NACL 50-0.9 MG/10ML-% IV SOSY
PREFILLED_SYRINGE | INTRAVENOUS | Status: DC | PRN
  Administered 2023-02-18: 5 mg via INTRAVENOUS
  Administered 2023-02-18: 10 mg via INTRAVENOUS
  Administered 2023-02-18: 5 mg via INTRAVENOUS

## 2023-02-18 MED ORDER — ALBUTEROL SULFATE (2.5 MG/3ML) 0.083% IN NEBU: 3.0000 mL | INHALATION_SOLUTION | Freq: Four times a day (QID) | RESPIRATORY_TRACT | Status: DC | PRN

## 2023-02-18 MED ORDER — MIDAZOLAM HCL 5 MG/5ML IJ SOLN
INTRAMUSCULAR | Status: DC | PRN
  Administered 2023-02-18: 2 mg via INTRAVENOUS

## 2023-02-18 MED ORDER — SERTRALINE HCL 100 MG PO TABS
100.0000 mg | ORAL_TABLET | Freq: Every day | ORAL | Status: DC
  Administered 2023-02-19: 100 mg via ORAL
  Filled 2023-02-18: qty 1

## 2023-02-18 MED ORDER — BUPIVACAINE LIPOSOME 1.3 % IJ SUSP: 20.0000 mL | Freq: Once | INTRAMUSCULAR | Status: DC

## 2023-02-18 MED ORDER — OXYCODONE HCL 5 MG PO TABS: 5.0000 mg | ORAL_TABLET | Freq: Once | ORAL | Status: DC | PRN

## 2023-02-18 MED ORDER — ACETAMINOPHEN 500 MG PO TABS: 1000.0000 mg | ORAL_TABLET | ORAL | Status: AC

## 2023-02-18 MED ORDER — CHLORHEXIDINE GLUCONATE 0.12 % MT SOLN
15.0000 mL | Freq: Once | OROMUCOSAL | Status: AC
  Administered 2023-02-18: 15 mL via OROMUCOSAL

## 2023-02-18 MED ORDER — SIMETHICONE 80 MG PO CHEW
80.0000 mg | CHEWABLE_TABLET | Freq: Four times a day (QID) | ORAL | Status: DC | PRN
  Administered 2023-02-18: 80 mg via ORAL
  Filled 2023-02-18: qty 1

## 2023-02-18 MED ORDER — GABAPENTIN 100 MG PO CAPS
200.0000 mg | ORAL_CAPSULE | Freq: Two times a day (BID) | ORAL | Status: DC
  Administered 2023-02-18 – 2023-02-19 (×2): 200 mg via ORAL
  Filled 2023-02-18 (×2): qty 2

## 2023-02-18 MED ORDER — FENTANYL CITRATE (PF) 250 MCG/5ML IJ SOLN
INTRAMUSCULAR | Status: AC
  Filled 2023-02-18: qty 5

## 2023-02-18 MED ORDER — BUPIVACAINE LIPOSOME 1.3 % IJ SUSP
INTRAMUSCULAR | Status: DC | PRN
  Administered 2023-02-18: 20 mL

## 2023-02-18 MED ORDER — 0.9 % SODIUM CHLORIDE (POUR BTL) OPTIME
TOPICAL | Status: DC | PRN
  Administered 2023-02-18: 1000 mL

## 2023-02-18 MED ORDER — PROMETHAZINE HCL 25 MG/ML IJ SOLN
INTRAMUSCULAR | Status: AC
  Filled 2023-02-18: qty 1

## 2023-02-18 MED ORDER — HYDROMORPHONE HCL 1 MG/ML IJ SOLN
0.2500 mg | INTRAMUSCULAR | Status: DC | PRN
  Administered 2023-02-18 (×2): 0.5 mg via INTRAVENOUS

## 2023-02-18 MED ORDER — BUPIVACAINE-EPINEPHRINE (PF) 0.5% -1:200000 IJ SOLN
INTRAMUSCULAR | Status: AC
  Filled 2023-02-18: qty 30

## 2023-02-18 MED ORDER — ORAL CARE MOUTH RINSE: 15.0000 mL | Freq: Once | OROMUCOSAL | Status: AC

## 2023-02-18 MED ORDER — DEXAMETHASONE SODIUM PHOSPHATE 10 MG/ML IJ SOLN
INTRAMUSCULAR | Status: AC
  Filled 2023-02-18: qty 1

## 2023-02-18 MED ORDER — SODIUM CHLORIDE 0.9 % IV SOLN
2.0000 g | INTRAVENOUS | Status: AC
  Administered 2023-02-18: 2 g via INTRAVENOUS
  Filled 2023-02-18: qty 2

## 2023-02-18 MED ORDER — SCOPOLAMINE 1 MG/3DAYS TD PT72: 1.0000 | MEDICATED_PATCH | TRANSDERMAL | Status: DC

## 2023-02-18 MED ORDER — ACETAMINOPHEN 160 MG/5ML PO SOLN: 1000.0000 mg | Freq: Three times a day (TID) | ORAL | Status: DC

## 2023-02-18 MED ORDER — BUPIVACAINE-EPINEPHRINE 0.5% -1:200000 IJ SOLN
INTRAMUSCULAR | Status: DC | PRN
  Administered 2023-02-18: 30 mL

## 2023-02-18 MED ORDER — TRAMADOL HCL 50 MG PO TABS
50.0000 mg | ORAL_TABLET | Freq: Four times a day (QID) | ORAL | Status: DC | PRN
  Administered 2023-02-18 – 2023-02-19 (×2): 50 mg via ORAL
  Filled 2023-02-18 (×3): qty 1

## 2023-02-18 MED ORDER — OXYCODONE HCL 5 MG/5ML PO SOLN
5.0000 mg | Freq: Four times a day (QID) | ORAL | Status: DC | PRN
  Administered 2023-02-18 – 2023-02-19 (×3): 5 mg via ORAL
  Filled 2023-02-18 (×3): qty 5

## 2023-02-18 MED ORDER — HYDROMORPHONE HCL 1 MG/ML IJ SOLN
INTRAMUSCULAR | Status: AC
  Filled 2023-02-18: qty 2

## 2023-02-18 MED ORDER — STERILE WATER FOR IRRIGATION IR SOLN
Status: DC | PRN
  Administered 2023-02-18: 1000 mL

## 2023-02-18 MED ORDER — SODIUM CHLORIDE 0.9 % IV SOLN: INTRAVENOUS | Status: DC

## 2023-02-18 MED ORDER — APREPITANT 40 MG PO CAPS
40.0000 mg | ORAL_CAPSULE | ORAL | Status: AC
  Administered 2023-02-18: 40 mg via ORAL
  Filled 2023-02-18: qty 1

## 2023-02-18 MED ORDER — METOCLOPRAMIDE HCL 5 MG/ML IJ SOLN
10.0000 mg | Freq: Four times a day (QID) | INTRAMUSCULAR | Status: DC
  Administered 2023-02-18 – 2023-02-19 (×4): 10 mg via INTRAVENOUS
  Filled 2023-02-18 (×4): qty 2

## 2023-02-18 MED ORDER — MIDAZOLAM HCL 2 MG/2ML IJ SOLN: 0.5000 mg | Freq: Once | INTRAMUSCULAR | Status: DC | PRN

## 2023-02-18 MED ORDER — ENSURE MAX PROTEIN PO LIQD
2.0000 [oz_av] | ORAL | Status: DC
  Administered 2023-02-19 (×3): 2 [oz_av] via ORAL

## 2023-02-18 MED ORDER — ROCURONIUM BROMIDE 10 MG/ML (PF) SYRINGE
PREFILLED_SYRINGE | INTRAVENOUS | Status: AC
  Filled 2023-02-18: qty 10

## 2023-02-18 MED ORDER — DEXAMETHASONE SODIUM PHOSPHATE 10 MG/ML IJ SOLN
INTRAMUSCULAR | Status: DC | PRN
  Administered 2023-02-18: 10 mg via INTRAVENOUS

## 2023-02-18 MED ORDER — FENTANYL CITRATE (PF) 250 MCG/5ML IJ SOLN
INTRAMUSCULAR | Status: DC | PRN
  Administered 2023-02-18 (×2): 50 ug via INTRAVENOUS
  Administered 2023-02-18: 150 ug via INTRAVENOUS

## 2023-02-18 MED ORDER — ONDANSETRON HCL 4 MG/2ML IJ SOLN
INTRAMUSCULAR | Status: AC
  Filled 2023-02-18: qty 2

## 2023-02-18 MED ORDER — BUPIVACAINE LIPOSOME 1.3 % IJ SUSP
INTRAMUSCULAR | Status: AC
  Filled 2023-02-18: qty 20

## 2023-02-18 MED ORDER — ACETAMINOPHEN 500 MG PO TABS: 1000.0000 mg | ORAL_TABLET | Freq: Once | ORAL | Status: DC

## 2023-02-18 MED ORDER — LIDOCAINE HCL (PF) 2 % IJ SOLN
INTRAMUSCULAR | Status: AC
  Filled 2023-02-18: qty 5

## 2023-02-18 MED ORDER — HEPARIN SODIUM (PORCINE) 5000 UNIT/ML IJ SOLN
5000.0000 [IU] | Freq: Three times a day (TID) | INTRAMUSCULAR | Status: DC
  Administered 2023-02-18 – 2023-02-19 (×3): 5000 [IU] via SUBCUTANEOUS
  Filled 2023-02-18 (×3): qty 1

## 2023-02-18 MED ORDER — PROMETHAZINE HCL 25 MG/ML IJ SOLN
6.2500 mg | INTRAMUSCULAR | Status: DC | PRN
  Administered 2023-02-18: 6.25 mg via INTRAVENOUS

## 2023-02-18 MED ORDER — SUGAMMADEX SODIUM 200 MG/2ML IV SOLN
INTRAVENOUS | Status: DC | PRN
  Administered 2023-02-18: 200 mg via INTRAVENOUS

## 2023-02-18 MED ORDER — GABAPENTIN 300 MG PO CAPS
300.0000 mg | ORAL_CAPSULE | ORAL | Status: AC
  Administered 2023-02-18: 300 mg via ORAL
  Filled 2023-02-18: qty 1

## 2023-02-18 MED ORDER — METOPROLOL TARTRATE 5 MG/5ML IV SOLN: 5.0000 mg | Freq: Four times a day (QID) | INTRAVENOUS | Status: DC | PRN

## 2023-02-18 MED ORDER — LIDOCAINE 2% (20 MG/ML) 5 ML SYRINGE
INTRAMUSCULAR | Status: DC | PRN
  Administered 2023-02-18: 30 mg via INTRAVENOUS

## 2023-02-18 MED ORDER — ACETAMINOPHEN 500 MG PO TABS
1000.0000 mg | ORAL_TABLET | Freq: Three times a day (TID) | ORAL | Status: DC
  Administered 2023-02-18 – 2023-02-19 (×3): 1000 mg via ORAL
  Filled 2023-02-18 (×3): qty 2

## 2023-02-18 MED ORDER — OXYCODONE HCL 5 MG/5ML PO SOLN: 5.0000 mg | Freq: Once | ORAL | Status: DC | PRN

## 2023-02-18 MED ORDER — ROCURONIUM BROMIDE 10 MG/ML (PF) SYRINGE
PREFILLED_SYRINGE | INTRAVENOUS | Status: DC | PRN
  Administered 2023-02-18: 70 mg via INTRAVENOUS
  Administered 2023-02-18: 10 mg via INTRAVENOUS

## 2023-02-18 MED ORDER — ARIPIPRAZOLE 2 MG PO TABS
2.0000 mg | ORAL_TABLET | Freq: Every day | ORAL | Status: DC
  Administered 2023-02-19: 2 mg via ORAL
  Filled 2023-02-18: qty 1

## 2023-02-18 MED ORDER — ACETAMINOPHEN 500 MG PO TABS
ORAL_TABLET | ORAL | Status: AC
  Administered 2023-02-18: 1000 mg via ORAL
  Filled 2023-02-18: qty 2

## 2023-02-18 MED ORDER — ONDANSETRON HCL 4 MG/2ML IJ SOLN
INTRAMUSCULAR | Status: DC | PRN
  Administered 2023-02-18: 4 mg via INTRAVENOUS

## 2023-02-18 MED ORDER — PANTOPRAZOLE SODIUM 40 MG IV SOLR
40.0000 mg | Freq: Every day | INTRAVENOUS | Status: DC
  Administered 2023-02-18: 40 mg via INTRAVENOUS
  Filled 2023-02-18: qty 10

## 2023-02-18 MED ORDER — DOCUSATE SODIUM 100 MG PO CAPS
100.0000 mg | ORAL_CAPSULE | Freq: Two times a day (BID) | ORAL | Status: DC
  Administered 2023-02-18 – 2023-02-19 (×3): 100 mg via ORAL
  Filled 2023-02-18 (×3): qty 1

## 2023-02-18 MED ORDER — METHOCARBAMOL 1000 MG/10ML IJ SOLN: 500.0000 mg | Freq: Four times a day (QID) | INTRAVENOUS | Status: DC | PRN

## 2023-02-18 MED ORDER — LISDEXAMFETAMINE DIMESYLATE 30 MG PO CAPS
60.0000 mg | ORAL_CAPSULE | Freq: Every day | ORAL | Status: DC
  Filled 2023-02-18 (×2): qty 2

## 2023-02-18 MED ORDER — SCOPOLAMINE 1 MG/3DAYS TD PT72
MEDICATED_PATCH | TRANSDERMAL | Status: AC
  Filled 2023-02-18: qty 1

## 2023-02-18 MED ORDER — HEPARIN SODIUM (PORCINE) 5000 UNIT/ML IJ SOLN
5000.0000 [IU] | INTRAMUSCULAR | Status: AC
  Administered 2023-02-18: 5000 [IU] via SUBCUTANEOUS
  Filled 2023-02-18: qty 1

## 2023-02-18 MED ORDER — MIDAZOLAM HCL 2 MG/2ML IJ SOLN
INTRAMUSCULAR | Status: AC
  Filled 2023-02-18: qty 2

## 2023-02-18 MED ORDER — ONDANSETRON HCL 4 MG/2ML IJ SOLN: 4.0000 mg | INTRAMUSCULAR | Status: DC | PRN

## 2023-02-18 MED ORDER — PROPOFOL 10 MG/ML IV BOLUS
INTRAVENOUS | Status: AC
  Filled 2023-02-18: qty 20

## 2023-02-18 MED ORDER — EPHEDRINE 5 MG/ML INJ
INTRAVENOUS | Status: AC
  Filled 2023-02-18: qty 5

## 2023-02-18 MED ORDER — PROPOFOL 10 MG/ML IV BOLUS
INTRAVENOUS | Status: DC | PRN
  Administered 2023-02-18: 300 mg via INTRAVENOUS

## 2023-02-18 MED ORDER — MEPERIDINE HCL 50 MG/ML IJ SOLN: 6.2500 mg | INTRAMUSCULAR | Status: DC | PRN

## 2023-02-18 MED ORDER — LACTATED RINGERS IV SOLN: INTRAVENOUS | Status: DC

## 2023-02-18 SURGICAL SUPPLY — 79 items
ANTIFOG SOL W/FOAM PAD STRL (MISCELLANEOUS) ×1
APL PRP STRL LF DISP 70% ISPRP (MISCELLANEOUS) ×2
APL SKNCLS STERI-STRIP NONHPOA (GAUZE/BANDAGES/DRESSINGS) ×1
APPLIER CLIP ROT 10 11.4 M/L (STAPLE) ×1
APPLIER CLIP ROT 13.4 12 LRG (CLIP)
APR CLP LRG 13.4X12 ROT 20 MLT (CLIP)
APR CLP MED LRG 11.4X10 (STAPLE) ×1
BAG COUNTER SPONGE SURGICOUNT (BAG) IMPLANT
BAG LAPAROSCOPIC 12 15 PORT 16 (BASKET) IMPLANT
BAG RETRIEVAL 12/15 (BASKET)
BAG SPNG CNTER NS LX DISP (BAG)
BENZOIN TINCTURE PRP APPL 2/3 (GAUZE/BANDAGES/DRESSINGS) ×1 IMPLANT
BLADE SURG SZ11 CARB STEEL (BLADE) ×1 IMPLANT
BNDG ADH 1X3 SHEER STRL LF (GAUZE/BANDAGES/DRESSINGS) ×6 IMPLANT
BNDG ADH THN 3X1 STRL LF (GAUZE/BANDAGES/DRESSINGS) ×6
CABLE HIGH FREQUENCY MONO STRZ (ELECTRODE) IMPLANT
CHLORAPREP W/TINT 26 (MISCELLANEOUS) ×2 IMPLANT
CLIP APPLIE ROT 10 11.4 M/L (STAPLE) IMPLANT
CLIP APPLIE ROT 13.4 12 LRG (CLIP) IMPLANT
COVER SURGICAL LIGHT HANDLE (MISCELLANEOUS) ×1 IMPLANT
DEVICE SUT QUICK LOAD TK 5 (SUTURE) IMPLANT
DEVICE SUT TI-KNOT TK 5X26 (SUTURE) IMPLANT
DRAPE UTILITY XL STRL (DRAPES) ×2 IMPLANT
ELECT REM PT RETURN 15FT ADLT (MISCELLANEOUS) ×1 IMPLANT
GAUZE SPONGE 4X4 12PLY STRL (GAUZE/BANDAGES/DRESSINGS) IMPLANT
GLOVE BIO SURGEON STRL SZ 6 (GLOVE) ×1 IMPLANT
GLOVE INDICATOR 6.5 STRL GRN (GLOVE) ×1 IMPLANT
GLOVE SS BIOGEL STRL SZ 6 (GLOVE) ×1 IMPLANT
GOWN STRL REUS W/ TWL LRG LVL3 (GOWN DISPOSABLE) ×1 IMPLANT
GOWN STRL REUS W/ TWL XL LVL3 (GOWN DISPOSABLE) IMPLANT
GOWN STRL REUS W/TWL LRG LVL3 (GOWN DISPOSABLE) ×2
GOWN STRL REUS W/TWL XL LVL3 (GOWN DISPOSABLE) ×2
GRASPER SUT TROCAR 14GX15 (MISCELLANEOUS) ×1 IMPLANT
IRRIG SUCT STRYKERFLOW 2 WTIP (MISCELLANEOUS) ×1
IRRIGATION SUCT STRKRFLW 2 WTP (MISCELLANEOUS) ×1 IMPLANT
KIT BASIN OR (CUSTOM PROCEDURE TRAY) ×1 IMPLANT
KIT TURNOVER KIT A (KITS) IMPLANT
MARKER SKIN DUAL TIP RULER LAB (MISCELLANEOUS) ×1 IMPLANT
MAT PREVALON FULL STRYKER (MISCELLANEOUS) ×1 IMPLANT
NDL SPNL 22GX3.5 QUINCKE BK (NEEDLE) ×1 IMPLANT
NEEDLE SPNL 22GX3.5 QUINCKE BK (NEEDLE) ×1 IMPLANT
PACK UNIVERSAL I (CUSTOM PROCEDURE TRAY) ×1 IMPLANT
RELOAD ENDO STITCH (ENDOMECHANICALS) IMPLANT
RELOAD STAPLE 60 3.6 BLU REG (STAPLE) ×1 IMPLANT
RELOAD STAPLE 60 3.8 GOLD REG (STAPLE) ×1 IMPLANT
RELOAD STAPLE 60 4.1 GRN THCK (STAPLE) IMPLANT
RELOAD STAPLER BLUE 60MM (STAPLE) ×5 IMPLANT
RELOAD STAPLER GOLD 60MM (STAPLE) ×1 IMPLANT
RELOAD STAPLER GREEN 60MM (STAPLE) IMPLANT
RELOAD SUT TRIPLE-STITCH 2-0 (ENDOMECHANICALS) IMPLANT
SCISSORS LAP 5X45 EPIX DISP (ENDOMECHANICALS) ×1 IMPLANT
SET TUBE SMOKE EVAC HIGH FLOW (TUBING) ×1 IMPLANT
SHEARS HARMONIC ACE PLUS 45CM (MISCELLANEOUS) ×1 IMPLANT
SLEEVE ADV FIXATION 5X100MM (TROCAR) ×2 IMPLANT
SLEEVE GASTRECTOMY 40FR VISIGI (MISCELLANEOUS) ×1 IMPLANT
SOLUTION ANTFG W/FOAM PAD STRL (MISCELLANEOUS) ×1 IMPLANT
SPIKE FLUID TRANSFER (MISCELLANEOUS) ×1 IMPLANT
SPONGE T-LAP 18X18 ~~LOC~~+RFID (SPONGE) ×1 IMPLANT
STAPLE LINE REINFORCEMENT LAP (STAPLE) IMPLANT
STAPLER ECHELON LONG 3000 60 (ENDOMECHANICALS) IMPLANT
STAPLER ECHELON LONG 60 440 (INSTRUMENTS) ×1 IMPLANT
STAPLER RELOAD BLUE 60MM (STAPLE) ×5
STAPLER RELOAD GOLD 60MM (STAPLE) ×1
STAPLER RELOAD GREEN 60MM (STAPLE)
STRIP CLOSURE SKIN 1/2X4 (GAUZE/BANDAGES/DRESSINGS) ×1 IMPLANT
SUT MNCRL AB 4-0 PS2 18 (SUTURE) ×1 IMPLANT
SUT SURGIDAC NAB ES-9 0 48 120 (SUTURE) IMPLANT
SUT VICRYL 0 TIES 12 18 (SUTURE) ×1 IMPLANT
SYR 20ML ECCENTRIC (SYRINGE) ×1 IMPLANT
SYR 20ML LL LF (SYRINGE) ×1 IMPLANT
SYR 50ML LL SCALE MARK (SYRINGE) ×1 IMPLANT
SYS KII OPTICAL ACCESS 15MM (TROCAR) ×1
SYSTEM KII OPTICAL ACCESS 15MM (TROCAR) ×1 IMPLANT
TOWEL OR 17X26 10 PK STRL BLUE (TOWEL DISPOSABLE) ×1 IMPLANT
TOWEL OR NON WOVEN STRL DISP B (DISPOSABLE) ×1 IMPLANT
TROCAR ADV FIXATION 5X100MM (TROCAR) ×1 IMPLANT
TROCAR XCEL NON-BLD 5MMX100MML (ENDOMECHANICALS) ×1 IMPLANT
TUBING CONNECTING 10 (TUBING) ×1 IMPLANT
TUBING ENDO SMARTCAP (MISCELLANEOUS) ×1 IMPLANT

## 2023-02-18 NOTE — Transfer of Care (Signed)
Immediate Anesthesia Transfer of Care Note  Patient: Jessica Espinoza  Procedure(s) Performed: LAPAROSCOPIC GASTRIC SLEEVE RESECTION UPPER GI ENDOSCOPY  Patient Location: PACU  Anesthesia Type:General  Level of Consciousness: oriented, drowsy, and patient cooperative  Airway & Oxygen Therapy: Patient Spontanous Breathing and Patient connected to face mask oxygen  Post-op Assessment: Report given to RN and Post -op Vital signs reviewed and stable  Post vital signs: Reviewed  Last Vitals:  Vitals Value Taken Time  BP 152/87 02/18/23 1254  Temp 36.9 C 02/18/23 1254  Pulse 91 02/18/23 1257  Resp 28 02/18/23 1257  SpO2 95 % 02/18/23 1257  Vitals shown include unvalidated device data.  Last Pain:  Vitals:   02/18/23 0958  TempSrc:   PainSc: 0-No pain         Complications: No notable events documented.

## 2023-02-18 NOTE — Progress Notes (Signed)
Discussed QI "Goals for Discharge" document with patient including ambulation in halls, Incentive Spirometry use every hour, and oral care.  Also discussed pain and nausea control.  Enabled or verified head of bed 30 degree alarm activated.  BSTOP education provided including BSTOP information guide, "Guide for Pain Management after your Bariatric Procedure".  Diet progression education provided including "Bariatric Surgery Post-Op Food Plan Phase 1: Liquids".  Questions answered.  Will continue to partner with bedside RN and follow up with patient per protocol.    Thank you,  Asmara Backs Kalkidan Caudell, RN, MSN Bariatric Nurse Coordinator [phone] (office)  

## 2023-02-18 NOTE — Anesthesia Preprocedure Evaluation (Addendum)
Anesthesia Evaluation  Patient identified by MRN, date of birth, ID band Patient awake    Reviewed: Allergy & Precautions, NPO status , Patient's Chart, lab work & pertinent test results  History of Anesthesia Complications Negative for: history of anesthetic complications  Airway Mallampati: I  TM Distance: >3 FB Neck ROM: Full    Dental  (+) Dental Advisory Given   Pulmonary asthma , COPD (rarely needs inhaler since childhood),  COPD inhaler, former smoker   breath sounds clear to auscultation       Cardiovascular hypertension, Pt. on medications (-) angina  Rhythm:Regular Rate:Normal     Neuro/Psych  Headaches  Anxiety Depression       GI/Hepatic Neg liver ROS,GERD  Controlled,,  Endo/Other  BMI 40  Renal/GU negative Renal ROS     Musculoskeletal   Abdominal   Peds  Hematology negative hematology ROS (+)   Anesthesia Other Findings   Reproductive/Obstetrics                              Anesthesia Physical Anesthesia Plan  ASA: 3  Anesthesia Plan: General   Post-op Pain Management: Tylenol PO (pre-op)*   Induction: Intravenous  PONV Risk Score and Plan: 3 and Ondansetron, Dexamethasone and Scopolamine patch - Pre-op  Airway Management Planned: Oral ETT  Additional Equipment: None  Intra-op Plan:   Post-operative Plan: Extubation in OR  Informed Consent: I have reviewed the patients History and Physical, chart, labs and discussed the procedure including the risks, benefits and alternatives for the proposed anesthesia with the patient or authorized representative who has indicated his/her understanding and acceptance.     Dental advisory given  Plan Discussed with: CRNA and Surgeon  Anesthesia Plan Comments:          Anesthesia Quick Evaluation

## 2023-02-18 NOTE — Op Note (Signed)
Operative Note  Jessica Espinoza  [id number]  [id number]  02/18/2023   Surgeon: Phylliss Blakes MD FACS   Assistant: Gaynelle Adu MD FACS   Procedure performed: laparoscopic sleeve gastrectomy, upper endoscopy   Preop diagnosis: Morbid obesity Body mass index is 40.11 kg/m. Post-op diagnosis/intraop findings: same   Specimens: fundus Retained items: none  EBL: minimal  Complications: none   Description of procedure: After obtaining informed consent and administration of chemical DVT prophylaxis in holding, the patient was taken to the operating room and placed supine on operating room table where general endotracheal anesthesia was initiated, preoperative antibiotics were administered, SCDs applied, and a formal timeout was performed. The abdomen was prepped and draped in usual sterile fashion. Peritoneal access was gained using a Visiport technique in the left upper quadrant and insufflation to 15 mmHg ensued without issue. Gross inspection revealed no evidence of injury.  Under direct visualization three more 5 mm trochars were placed in the right and left hemiabdomen and the 12mm trocar in the right paramedian upper abdomen. Bilateral laparoscopic assisted TAPS blocks were performed with Exparel diluted with 0.5 percent Marcaine with epinephrine. The patient was placed in steep reverse Trendelenburg and the liver retractor was introduced through an incision in the upper midline and secured to the post externally to maintain the left lobe retracted anteriorly.  There is no hiatal hernia on direct inspection. Using the Harmonic scalpel, the greater curvature of the stomach was dissected away from the greater omentum and short gastric vessels were divided. This began 6 cm from the pylorus, and dissection proceeded until the left crus was clearly exposed. There were some filmy adhesions of the posterior stomach to the retroperitoneum which were divided with the Harmonic. Esophageal fat pad was  mobilized off the anterior stomach slightly. The 37 [place] VisiGi was then introduced and directed down towards the pylorus. This was placed to suction against the lesser curve. Serial fires of the linear cutting stapler were then employed to create our sleeve. The first fire used a gold load and ensured adequate room at the angularis incisura. Several blue loads were then employed to create an evenly tubular stomach, preserving 1cm lateral to the angle of His. The excised stomach was then removed through our 15 mm trocar site within an Endo Catch bag.  The visigi was taken off of suction and a few puffs of air were introduced, inflating the sleeve. No bubbles were observed in the irrigation fluid around the stomach and the shape was noted to be evenly tubular without any narrowing at the angularis. The visigi was then removed. Upper endoscopy was performed by the assistant surgeon and the sleeve was noted to be airtight, the staple line was hemostatic. Please see his separate note. The endoscope was removed. A small amount of oozing on the most distal aspect of the staple line was addressed with clips. The 12 mm trocar site fascia in the right upper abdomen was closed with a 0 Vicryl using the laparoscopic suture passer under direct visualization. The liver retractor was removed under direct visualization. The abdomen was then desufflated and all remaining trochars removed. The skin incisions were closed with subcuticular 4-0 Monocryl; benzoin, Steri-Strips and Band-Aids were applied The patient was then awakened, extubated and taken to PACU in stable condition.     All counts were correct at the completion of the case.

## 2023-02-18 NOTE — Anesthesia Postprocedure Evaluation (Signed)
Anesthesia Post Note  Patient: Jessica Espinoza  Procedure(s) Performed: LAPAROSCOPIC GASTRIC SLEEVE RESECTION UPPER GI ENDOSCOPY     Patient location during evaluation: PACU Anesthesia Type: General Level of consciousness: awake and alert, patient cooperative and oriented Pain management: pain level controlled Vital Signs Assessment: post-procedure vital signs reviewed and stable Respiratory status: spontaneous breathing, nonlabored ventilation and respiratory function stable Cardiovascular status: blood pressure returned to baseline and stable Postop Assessment: no apparent nausea or vomiting Anesthetic complications: no   No notable events documented.  Last Vitals:  Vitals:   02/18/23 1400 02/18/23 1412  BP: (!) 150/77 (!) 164/83  Pulse: 98 81  Resp: 18 15  Temp: 37.1 C 36.4 C  SpO2: 96% 99%    Last Pain:  Vitals:   02/18/23 1412  TempSrc: Oral  PainSc: 8                  Kyonna Frier,E. Charmane Protzman

## 2023-02-18 NOTE — Interval H&P Note (Signed)
History and Physical Interval Note:  02/18/2023 10:20 AM  Jessica Espinoza  has presented today for surgery, with the diagnosis of MORBID OBESITY.  The various methods of treatment have been discussed with the patient and family. After consideration of risks, benefits and other options for treatment, the patient has consented to  Procedure(s): LAPAROSCOPIC GASTRIC SLEEVE RESECTION (N/A) UPPER GI ENDOSCOPY (N/A) as a surgical intervention.  The patient's history has been reviewed, patient examined, no change in status, stable for surgery.  I have reviewed the patient's chart and labs.  Questions were answered to the patient's satisfaction.     [name] Lollie Sails

## 2023-02-18 NOTE — Anesthesia Procedure Notes (Signed)
Procedure Name: Intubation Date/Time: 02/18/2023 11:18 AM  Performed by: Lovie Chol, CRNAPre-anesthesia Checklist: Patient identified, Emergency Drugs available, Suction available and Patient being monitored Patient Re-evaluated:Patient Re-evaluated prior to induction Oxygen Delivery Method: Circle System Utilized Preoxygenation: Pre-oxygenation with 100% oxygen Induction Type: IV induction Ventilation: Mask ventilation without difficulty Laryngoscope Size: Miller and 3 Grade View: Grade I Tube type: Oral Tube size: 7.0 mm Number of attempts: 1 Airway Equipment and Method: Stylet Placement Confirmation: ETT inserted through vocal cords under direct vision, positive ETCO2 and breath sounds checked- equal and bilateral Secured at: 22 cm Tube secured with: Tape Dental Injury: Teeth and Oropharynx as per pre-operative assessment

## 2023-02-18 NOTE — Op Note (Signed)
Jessica Espinoza [id number] [birth year] 02/18/2023  Preoperative diagnosis: severe obesity  Postoperative diagnosis: Same   Procedure: upper endoscopy   Surgeon: Mary Sella. Trayvon Trumbull M.D., FACS   Anesthesia: Gen.   Indications for procedure: 37 y.o. year old female undergoing Laparoscopic Gastric Sleeve Resection and an EGD was requested to evaluate the new gastric sleeve.   Description of procedure: After we have completed the sleeve resection, I scrubbed out and obtained the Olympus endoscope. I gently placed endoscope in the patient's oropharynx and gently glided it down the esophagus without any difficulty under direct visualization. Once I was in the gastric sleeve, I insufflated the stomach with air. I was able to cannulate and advanced the scope through the gastric sleeve. I was able to cannulate the duodenum with ease. Dr. Fredricka Bonine had placed saline in the upper abdomen. Upon further insufflation of the gastric sleeve there was no evidence of bubbles. GE junction located at 38 cm.  Upon further inspection of the gastric sleeve, the mucosa appeared normal. There is no evidence of any mucosal abnormality. The sleeve was widely patent at the angularis. There was no evidence of bleeding. The gastric sleeve was decompressed. The scope was withdrawn. The patient tolerated this portion of the procedure well. Please see Dr Derrill Memo operative note for details regarding the laparoscopic gastric sleeve resection.   Mary Sella. Andrey Campanile, MD, FACS  General, Bariatric, & Minimally Invasive Surgery  [facility], [place]

## 2023-02-18 NOTE — Progress Notes (Signed)
PHARMACY CONSULT FOR:  Risk Assessment for Post-Discharge VTE Following Bariatric Surgery  Post-Discharge VTE Risk Assessment: This patient's probability of 30-day post-discharge VTE is increased due to the factors marked: X Sleeve gastrectomy   Liver disorder (transplant, cirrhosis, or nonalcoholic steatohepatitis)   Hx of VTE   Hemorrhage requiring transfusion   GI perforation, leak, or obstruction   ====================================================    Female    Age >/=60 years    BMI >/=50 kg/m2    CHF    Dyspnea at Rest    Paraplegia  X  Non-gastric-band surgery    Operation Time >/=3 hr    Return to OR     Length of Stay >/= 3 d   Hypercoagulable condition   Significant venous stasis      Predicted probability of 30-day post-discharge VTE:  Other patient-specific factors to consider: 0.16%   Recommendation for Discharge: No pharmacologic prophylaxis post-discharge  Jessica Espinoza is a 37 y.o. female who underwent laparoscopic sleeve gastrectomy 02/18/2023    Allergies  Allergen Reactions   Mustard Oil [Allyl Isothiocyanate] Swelling and Rash    Patient Measurements: Height: 5\' 4"  (162.6 cm) Weight: 106 kg (233 lb 11 oz) IBW/kg (Calculated) : 54.7 Body mass index is 40.11 kg/m.  No results for input(s): "WBC", "HGB", "HCT", "PLT", "APTT", "CREATININE", "LABCREA", "CREAT24HRUR", "MG", "PHOS", "ALBUMIN", "PROT", "AST", "ALT", "ALKPHOS", "BILITOT", "BILIDIR", "IBILI" in the last 72 hours. Estimated Creatinine Clearance: 115.4 mL/min (by C-G formula based on SCr of 0.47 mg/dL).    Past Medical History:  Diagnosis Date   ADHD    Anxiety    BMI 37.0-37.9, adult    Depression    Headache    sensory overload causes Migraines   Hypertension    Obesity      Medications Prior to Admission  Medication Sig Dispense Refill Last Dose   acetaminophen (TYLENOL) 500 MG tablet Take 1,000 mg by mouth as needed for moderate pain.   02/17/2023   aluminum  chloride (DRYSOL) 20 % external solution Apply solution sparingly topically once weekly. (Patient taking differently: Apply 1 Application topically every 14 (fourteen) days.) 60 mL 1 Past Week   amphetamine-dextroamphetamine (ADDERALL) 20 MG tablet Take 0.5-1 tablets (10-20 mg total) by mouth daily in the afternoon. (Patient taking differently: Take 20 mg by mouth daily as needed (for when working or at school).) 30 tablet 0 02/14/2023   ARIPiprazole (ABILIFY) 2 MG tablet Take 1 tablet (2 mg total) by mouth daily. 30 tablet 0 02/18/2023 at 0645   cetirizine (ZYRTEC) 10 MG tablet Take 10 mg by mouth daily.   Past Month   Cholecalciferol 50 MCG (2000 UT) TABS Take 2,000 Units by mouth daily.   Past Month   cyclobenzaprine (FLEXERIL) 10 MG tablet Take 1 tablet (10 mg total) by mouth 3 (three) times daily as needed for muscle spasms. (Patient taking differently: Take 10 mg by mouth as needed for muscle spasms.) 30 tablet 0 Past Week   EPINEPHrine 0.3 mg/0.3 mL IJ SOAJ injection Inject 0.3 mg into the muscle as needed for anaphylaxis.   never needed   ibuprofen (ADVIL) 200 MG tablet Take 600 mg by mouth as needed for moderate pain.      lisdexamfetamine (VYVANSE) 60 MG capsule Take 1 capsule (60 mg total) by mouth daily. 30 capsule 0 Past Month   Multiple Vitamin (MULTI-VITAMIN) tablet Take 1 tablet by mouth daily.   02/10/2023   olmesartan (BENICAR) 40 MG tablet Take 1 tablet (40 mg total)  by mouth once daily 90 tablet 1 02/17/2023   Probiotic Product (PROBIOTIC PO) Take 1 capsule by mouth daily.   02/10/2023   sertraline (ZOLOFT) 100 MG tablet Take 1 tablet (100 mg total) by mouth daily. 90 tablet 0 02/18/2023 at 0645   albuterol (VENTOLIN HFA) 108 (90 Base) MCG/ACT inhaler Inhale 1-2 puffs into the lungs every 6 (six) hours as needed. (Patient taking differently: Inhale 2 puffs into the lungs every 6 (six) hours as needed for wheezing or shortness of breath.) 6.7 g 0 More than a month   dicyclomine (BENTYL) 20  MG tablet Take 1 tablet (20 mg total) by mouth every 6 (six) hours as needed. (Patient taking differently: Take 20 mg by mouth as needed for spasms.) 30 tablet 1 More than a month   fluticasone (FLONASE) 50 MCG/ACT nasal spray Place 1 spray into the nose as needed for rhinitis or allergies.   More than a month     Herby Abraham, Pharm.D Use secure chat for questions 02/18/2023 12:47 PM

## 2023-02-19 ENCOUNTER — Other Ambulatory Visit: Payer: Self-pay

## 2023-02-19 ENCOUNTER — Encounter (HOSPITAL_COMMUNITY): Payer: Self-pay | Admitting: Surgery

## 2023-02-19 LAB — COMPREHENSIVE METABOLIC PANEL
ALT: 27 U/L (ref 0–44)
AST: 24 U/L (ref 15–41)
Albumin: 3.8 g/dL (ref 3.5–5.0)
Alkaline Phosphatase: 75 U/L (ref 38–126)
Anion gap: 9 (ref 5–15)
BUN: 8 mg/dL (ref 6–20)
CO2: 26 mmol/L (ref 22–32)
Calcium: 8.7 mg/dL — ABNORMAL LOW (ref 8.9–10.3)
Chloride: 100 mmol/L (ref 98–111)
Creatinine, Ser: 0.55 mg/dL (ref 0.44–1.00)
GFR, Estimated: >60 mL/min (ref >60)
Glucose, Bld: 123 mg/dL — ABNORMAL HIGH (ref 70–99)
Potassium: 4.4 mmol/L (ref 3.5–5.1)
Sodium: 135 mmol/L (ref 135–145)
Total Bilirubin: 0.6 mg/dL (ref 0.3–1.2)
Total Protein: 7 g/dL (ref 6.5–8.1)

## 2023-02-19 LAB — CBC
HCT: 36.3 % (ref 36.0–46.0)
Hemoglobin: 11.9 g/dL — ABNORMAL LOW (ref 12.0–15.0)
MCH: 26.6 pg (ref 26.0–34.0)
MCHC: 32.8 g/dL (ref 30.0–36.0)
MCV: 81 fL (ref 80.0–100.0)
Platelets: 272 10*3/uL (ref 150–400)
RBC: 4.48 MIL/uL (ref 3.87–5.11)
RDW: 12.8 % (ref 11.5–15.5)
WBC: 13.3 10*3/uL — ABNORMAL HIGH (ref 4.0–10.5)
nRBC: 0 % (ref 0.0–0.2)

## 2023-02-19 LAB — MAGNESIUM: Magnesium: 1.9 mg/dL (ref 1.7–2.4)

## 2023-02-19 LAB — SURGICAL PATHOLOGY

## 2023-02-19 MED ORDER — ONDANSETRON 4 MG PO TBDP
4.0000 mg | ORAL_TABLET | Freq: Four times a day (QID) | ORAL | 0 refills | Status: DC | PRN
Start: 2023-02-19 — End: 2024-06-12
  Filled 2023-02-19: qty 20, 5d supply, fill #0

## 2023-02-19 MED ORDER — DOCUSATE SODIUM 100 MG PO CAPS
100.0000 mg | ORAL_CAPSULE | Freq: Two times a day (BID) | ORAL | 0 refills | Status: AC
  Filled 2023-02-19: qty 30, 15d supply, fill #0

## 2023-02-19 MED ORDER — TRAMADOL HCL 50 MG PO TABS
50.0000 mg | ORAL_TABLET | Freq: Four times a day (QID) | ORAL | 0 refills | Status: DC | PRN
Start: 2023-02-19 — End: 2023-06-26
  Filled 2023-02-19: qty 10, 3d supply, fill #0

## 2023-02-19 MED ORDER — PANTOPRAZOLE SODIUM 40 MG PO TBEC
40.0000 mg | DELAYED_RELEASE_TABLET | Freq: Every day | ORAL | 0 refills | Status: DC
  Filled 2023-02-19: qty 90, 90d supply, fill #0

## 2023-02-19 MED ORDER — GABAPENTIN 100 MG PO CAPS
200.0000 mg | ORAL_CAPSULE | Freq: Two times a day (BID) | ORAL | 0 refills | Status: DC
Start: 2023-02-19 — End: 2023-06-26
  Filled 2023-02-19: qty 20, 5d supply, fill #0

## 2023-02-19 MED ORDER — ACETAMINOPHEN 500 MG PO TABS
1000.0000 mg | ORAL_TABLET | Freq: Three times a day (TID) | ORAL | 0 refills | Status: AC
Start: 2023-02-19 — End: 2023-02-24

## 2023-02-19 NOTE — Discharge Instructions (Signed)

## 2023-02-19 NOTE — Progress Notes (Signed)
S: Fair amount of pain yesterday and some grogginess. Pain in LUQ abdominal wall around incisions. Better this AM. She is tolerating liquids, is on her 2nd cup of protein. No nausea but some pressure/pain in the lower chest.  O: Vitals, labs, intake/output, and orders reviewed at this time.  Afebrile, no tachycardia, normotensive to moderately hypertensive. PO 540, UOP 2100.  CMP unremarkable.  WBC 13.3 (5.1), hemoglobin 11.9 (12.3)  PRNs-tramadol x 1, oxycodone x 2,  Gen: A&Ox3, no distress  H&N: EOMI, atraumatic, neck supple Chest: unlabored respirations, RRR Abd: soft, mildly tender around LUQ port sites, nondistended, incision(s) c/d/i without cellulitis or hematoma Ext: warm, no edema Neuro: grossly normal  Lines/tubes/drains: PIV  A/P: POD 1 s/p sleeve gastrectomy -Continue clears/ protein shakes -Continue SCDs while in bed, prophylactic heparin, ambulation -Pulmonary toilet -Discharge home later today if doing well   Phylliss Blakes, MD [facility], [place]

## 2023-02-19 NOTE — Progress Notes (Signed)
Patient alert and oriented, pain is controlled. Patient is tolerating fluids, advanced to protein shake today, patient is tolerating well. Reviewed Gastric sleeve/bypass discharge instructions with patient and patient is able to articulate understanding. Provided information on BELT program, Support Group, BSTOP, and [facility] outpatient pharmacy. Communicated general update of patient status to surgeon. All questions answered. 24hr fluid recall is 540 mL per hydration protocol, bariatric nurse coordinator to make follow-up phone call within one week.    Thank you,  Lubertha Basque, RN, MSN Bariatric Nurse Coordinator [phone] (office)

## 2023-02-19 NOTE — Progress Notes (Signed)
Patient was given discharge instructions, and all questions were answered.  Patient was stable for discharge and was taken to the main exit by wheelchair. 

## 2023-02-19 NOTE — TOC CM/SW Note (Signed)
  Transition of Care (TOC) Screening Note   Patient Details  Name: Jessica Espinoza Date of Birth: [birth year]   Transition of Care [facility]) CM/SW Contact:    Amada Jupiter, LCSW Phone Number: 02/19/2023, 10:26 AM    Transition of Care Department [facility]) has reviewed patient and no TOC needs have been identified at this time. We will continue to monitor patient advancement through interdisciplinary progression rounds. If new patient transition needs arise, please place a TOC consult.

## 2023-02-20 ENCOUNTER — Telehealth: Payer: Self-pay

## 2023-02-20 NOTE — Transitions of Care (Post Inpatient/ED Visit) (Unsigned)
   02/20/2023  Name: Jessica Espinoza MRN: [id number] DOB: 1986/01/31  Today's TOC FU Call Status: Today's TOC FU Call Status:: Unsuccessul Call (1st Attempt) Unsuccessful Call (1st Attempt) Date: 02/20/23  Attempted to reach the patient regarding the most recent Inpatient/ED visit.  Follow Up Plan: Additional outreach attempts will be made to reach the patient to complete the Transitions of Care (Post Inpatient/ED visit) call.   Signature Karena Addison, LPN [facility] Nurse Health Advisor Direct Dial [phone]

## 2023-02-20 NOTE — Discharge Summary (Signed)
Physician Discharge Summary  [facility] A Schwier ZOX:[id number] DOB: November 09, 1985 DOA: 02/18/2023  PCP: Berniece Salines, FNP  Admit date: 02/18/2023 Discharge date: 02/19/2023  Recommendations for Outpatient Follow-up:    Follow-up Information     Berna Bue, MD Follow up on 03/14/2023.   Specialty: General Surgery Why: Please arrive 15 minutes prior to your appointment at 9:10am Contact information: [address] [place] [address] [phone]         Berna Bue, MD Follow up on 04/16/2023.   Specialty: General Surgery Why: Please arrive 15 minutes prior to your appointment at 2:20p Contact information: [address] [place] [address] [phone]                Discharge Diagnoses:  Principal Problem:   Morbid obesity (HCC)   Surgical Procedure: Laparoscopic Sleeve Gastrectomy, upper endoscopy  Discharge Condition: Good Disposition: Home  Diet recommendation: Postoperative sleeve gastrectomy diet (liquids only)  Filed Weights   02/18/23 0930 02/18/23 0958  Weight: 106.2 kg 106 kg     Hospital Course:  The patient was admitted for a planned laparoscopic sleeve gastrectomy. Please see operative note. Preoperatively the patient was given 5000 units of subcutaneous heparin for DVT prophylaxis. Postoperative prophylactic heparin dosing was started on the evening of postoperative day 0. ERAS protocol was used. On the evening of postoperative day 0, the patient was started on water and ice chips. On postoperative day 1 the patient had no fever or tachycardia and was tolerating water in their diet was gradually advanced throughout the day. The patient was ambulating without difficulty. Their vital signs are stable without fever or tachycardia. Their hemoglobin had remained stableThe patient had received discharge instructions and counseling. They were deemed stable for discharge and had met discharge  criteria   Discharge Instructions   Allergies as of 02/19/2023       Reactions   Mustard Oil [allyl Isothiocyanate] Swelling, Rash        Medication List     STOP taking these medications    ibuprofen 200 MG tablet Commonly known as: ADVIL       TAKE these medications    acetaminophen 500 MG tablet Commonly known as: TYLENOL Take 1,000 mg by mouth as needed for moderate pain. What changed: Another medication with the same name was added. Make sure you understand how and when to take each.   acetaminophen 500 MG tablet Commonly known as: TYLENOL Take 2 tablets (1,000 mg total) by mouth every 8 (eight) hours for 5 days. What changed: You were already taking a medication with the same name, and this prescription was added. Make sure you understand how and when to take each.   albuterol 108 (90 Base) MCG/ACT inhaler Commonly known as: VENTOLIN HFA Inhale 1-2 puffs into the lungs every 6 (six) hours as needed. What changed:  how much to take reasons to take this   amphetamine-dextroamphetamine 20 MG tablet Commonly known as: Adderall Take 0.5-1 tablets (10-20 mg total) by mouth daily in the afternoon. What changed:  how much to take when to take this reasons to take this   ARIPiprazole 2 MG tablet Commonly known as: ABILIFY Take 1 tablet (2 mg total) by mouth daily.   cetirizine 10 MG tablet Commonly known as: ZYRTEC Take 10 mg by mouth daily.   Cholecalciferol 50 MCG (2000 UT) Tabs Take 2,000 Units by mouth daily.   cyclobenzaprine 10 MG tablet Commonly known as: FLEXERIL  Take 1 tablet (10 mg total) by mouth 3 (three) times daily as needed for muscle spasms. What changed: when to take this   dicyclomine 20 MG tablet Commonly known as: BENTYL Take 1 tablet (20 mg total) by mouth every 6 (six) hours as needed. What changed:  when to take this reasons to take this   docusate sodium 100 MG capsule Commonly known as: Colace Take 1 capsule (100 mg  total) by mouth 2 (two) times daily. Okay to decrease to once daily or stop taking if having loose bowel movements   Drysol 20 % external solution Generic drug: aluminum chloride Apply solution sparingly topically once weekly. What changed:  how much to take when to take this   EPINEPHrine 0.3 mg/0.3 mL Soaj injection Commonly known as: EPI-PEN Inject 0.3 mg into the muscle as needed for anaphylaxis.   fluticasone 50 MCG/ACT nasal spray Commonly known as: FLONASE Place 1 spray into the nose as needed for rhinitis or allergies.   gabapentin 100 MG capsule Commonly known as: NEURONTIN Take 2 capsules (200 mg total) by mouth every 12 (twelve) hours.   lisdexamfetamine 60 MG capsule Commonly known as: Vyvanse Take 1 capsule (60 mg total) by mouth daily.   Multi-Vitamin tablet Take 1 tablet by mouth daily.   olmesartan 40 MG tablet Commonly known as: BENICAR Take 1 tablet (40 mg total) by mouth once daily Notes to patient: Monitor Blood Pressure Daily and keep a log for primary care physician.  Your physician may need to make changes to your medications with rapid weight loss.      ondansetron 4 MG disintegrating tablet Commonly known as: ZOFRAN-ODT Take 1 tablet (4 mg total) by mouth every 6 (six) hours as needed for nausea or vomiting.   pantoprazole 40 MG tablet Commonly known as: PROTONIX Take 1 tablet (40 mg total) by mouth daily. Take this medication daily, regardless of reflux symptoms   PROBIOTIC PO Take 1 capsule by mouth daily.   sertraline 100 MG tablet Commonly known as: ZOLOFT Take 1 tablet (100 mg total) by mouth daily.   traMADol 50 MG tablet Commonly known as: ULTRAM Take 1 tablet (50 mg total) by mouth every 6 (six) hours as needed (pain).        Follow-up Information     Berna Bue, MD Follow up on 03/14/2023.   Specialty: General Surgery Why: Please arrive 15 minutes prior to your appointment at 9:10am Contact information: [address] [place] [address] [phone]         Berna Bue, MD Follow up on 04/16/2023.   Specialty: General Surgery Why: Please arrive 15 minutes prior to your appointment at 2:20p Contact information: [address] [place] [address] [phone]                  The results of significant diagnostics from this hospitalization (including imaging, microbiology, ancillary and laboratory) are listed below for reference.    Significant Diagnostic Studies: No results found.  Labs: Basic Metabolic Panel: Recent Labs  Lab 02/19/23 0453  NA 135  K 4.4  CL 100  CO2 26  GLUCOSE 123*  BUN 8  CREATININE 0.55  CALCIUM 8.7*  MG 1.9   Liver Function Tests: Recent Labs  Lab 02/19/23 0453  AST 24  ALT 27  ALKPHOS 75  BILITOT 0.6  PROT 7.0  ALBUMIN 3.8    CBC: Recent Labs  Lab 02/19/23 0453  WBC 13.3*  HGB 11.9*  HCT 36.3  MCV 81.0  PLT 272    CBG: No results for input(s): "GLUCAP" in the last 168 hours.  Principal Problem:   Morbid obesity (HCC)  Signed:  Berna Bue MD [facility] [phone] 02/20/2023, 8:28 AM

## 2023-02-21 ENCOUNTER — Telehealth: Payer: Self-pay | Admitting: Dietician

## 2023-02-21 NOTE — Telephone Encounter (Signed)
Returned patients request for information/questions about the post-op full liquid diet for the two weeks after surgery. Questions answered to patients satisfaction. Pt knows to call or email with any future questions. Pt scheduled to be seen on 05 March 2023 for the post-op class and advancement to soft prep plan.

## 2023-02-21 NOTE — Transitions of Care (Post Inpatient/ED Visit) (Signed)
02/21/2023  Name: Jessica Espinoza MRN: [id number] DOB: Apr 02, 1986  Today's TOC FU Call Status: Today's TOC FU Call Status:: Successful TOC FU Call Competed Unsuccessful Call (1st Attempt) Date: 02/20/23 [facility] FU Call Complete Date: 02/21/23  Transition Care Management Follow-up Telephone Call Date of Discharge: 02/19/23 Discharge Facility: [facility]) Type of Discharge: Inpatient Admission Primary Inpatient Discharge Diagnosis:: bariatric surgery How have you been since you were released from the hospital?: Better  Items Reviewed: Did you receive and understand the discharge instructions provided?: Yes Medications obtained,verified, and reconciled?: Yes (Medications Reviewed) Any new allergies since your discharge?: No Dietary orders reviewed?: Yes Do you have support at home?: Yes People in Home: spouse  Medications Reviewed Today: Medications Reviewed Today     Reviewed by Karena Addison, LPN (Licensed Practical Nurse) on 02/21/23 at 1422  Med List Status: <None>   Medication Order Taking? Sig Documenting Provider Last Dose Status Informant  acetaminophen (TYLENOL) 500 MG tablet [id number] Yes Take 1,000 mg by mouth as needed for moderate pain. [provider] Taking Active Self, Pharmacy Records  acetaminophen (TYLENOL) 500 MG tablet [id number] Yes Take 2 tablets (1,000 mg total) by mouth every 8 (eight) hours for 5 days. Berna Bue, MD Taking Active   albuterol (VENTOLIN HFA) 108 (90 Base) MCG/ACT inhaler [id number] Yes Inhale 1-2 puffs into the lungs every 6 (six) hours as needed.  Patient taking differently: Inhale 2 puffs into the lungs every 6 (six) hours as needed for wheezing or shortness of breath.   Mickie Bail, NP Taking Active Self, Pharmacy Records  aluminum chloride (DRYSOL) 20 % external solution [id number] Yes Apply solution sparingly topically once weekly.  Patient taking differently: Apply 1 Application topically every 14 (fourteen) days.     Taking Active Self, Pharmacy Records  amphetamine-dextroamphetamine (ADDERALL) 20 MG tablet [id number] Yes Take 0.5-1 tablets (10-20 mg total) by mouth daily in the afternoon.  Patient taking differently: Take 20 mg by mouth daily as needed (for when working or at school).    Taking Active Self, Pharmacy Records  ARIPiprazole (ABILIFY) 2 MG tablet [id number] Yes Take 1 tablet (2 mg total) by mouth daily. Berniece Salines, FNP Taking Active Self, Pharmacy Records  cetirizine (ZYRTEC) 10 MG tablet [id number] Yes Take 10 mg by mouth daily. [provider] Taking Active Self, Pharmacy Records  Cholecalciferol 50 MCG (2000 UT) TABS [id number] Yes Take 2,000 Units by mouth daily. [provider] Taking Active Self, Pharmacy Records  cyclobenzaprine (FLEXERIL) 10 MG tablet [id number] Yes Take 1 tablet (10 mg total) by mouth 3 (three) times daily as needed for muscle spasms.  Patient taking differently: Take 10 mg by mouth as needed for muscle spasms.   Berniece Salines, FNP Taking Active Self, Pharmacy Records  dicyclomine (BENTYL) 20 MG tablet [id number] Yes Take 1 tablet (20 mg total) by mouth every 6 (six) hours as needed.  Patient taking differently: Take 20 mg by mouth as needed for spasms.   Berniece Salines, FNP Taking Active Self, Pharmacy Records  docusate sodium (COLACE) 100 MG capsule [id number] Yes Take 1 capsule (100 mg total) by mouth 2 (two) times daily. Okay to decrease to once daily or stop taking if having loose bowel movements Berna Bue, MD Taking Active   EPINEPHrine 0.3 mg/0.3 mL IJ SOAJ injection [id number] Yes Inject 0.3 mg into the muscle as needed for anaphylaxis. [provider] Taking Active Self, Pharmacy Records  fluticasone [facility]) 50 MCG/ACT nasal spray [id number]  Place 1 spray into the nose as needed for rhinitis or allergies. [provider]  Expired 02/07/23 2359 Self, Pharmacy Records  gabapentin (NEURONTIN) 100 MG capsule [id number]  Yes Take 2 capsules (200 mg total) by mouth every 12 (twelve) hours. Berna Bue, MD Taking Active   lisdexamfetamine (VYVANSE) 60 MG capsule [id number] Yes Take 1 capsule (60 mg total) by mouth daily.  Taking Active Self, Pharmacy Records  Multiple Vitamin (MULTI-VITAMIN) tablet [id number] Yes Take 1 tablet by mouth daily. [provider] Taking Active Self, Pharmacy Records  olmesartan (BENICAR) 40 MG tablet [id number] Yes Take 1 tablet (40 mg total) by mouth once daily Berniece Salines, FNP Taking Active Self, Pharmacy Records  ondansetron (ZOFRAN-ODT) 4 MG disintegrating tablet [id number] Yes Take 1 tablet (4 mg total) by mouth every 6 (six) hours as needed for nausea or vomiting. Berna Bue, MD Taking Active   pantoprazole (PROTONIX) 40 MG tablet [id number] Yes Take 1 tablet (40 mg total) by mouth daily. Take this medication daily, regardless of reflux symptoms Berna Bue, MD Taking Active   Probiotic Product (PROBIOTIC PO) [id number] Yes Take 1 capsule by mouth daily. [provider] Taking Active Self, Pharmacy Records  sertraline (ZOLOFT) 100 MG tablet [id number] Yes Take 1 tablet (100 mg total) by mouth daily. Berniece Salines, FNP Taking Active Self, Pharmacy Records  traMADol Janean Sark) 50 MG tablet [id number] Yes Take 1 tablet (50 mg total) by mouth every 6 (six) hours as needed (pain). Berna Bue, MD Taking Active             Home Care and Equipment/Supplies: Were Home Health Services Ordered?: NA Any new equipment or medical supplies ordered?: NA  Functional Questionnaire: Do you need assistance with bathing/showering or dressing?: No Do you need assistance with meal preparation?: No Do you need assistance with eating?: No Do you have difficulty maintaining continence: No Do you need assistance with getting out of bed/getting out of a chair/moving?: No Do you have difficulty managing or taking your medications?: No  Follow up appointments  reviewed: PCP Follow-up appointment confirmed?: Yes Date of PCP follow-up appointment?: 02/22/23 Follow-up Provider: [facility] Hospital Follow-up appointment confirmed?: Yes Date of Specialist follow-up appointment?: 03/14/23 Follow-Up Specialty Provider:: Dr Fredricka Bonine Do you need transportation to your follow-up appointment?: No Do you understand care options if your condition(s) worsen?: Yes-patient verbalized understanding    SIGNATURE Karena Addison, LPN [facility] Nurse Health Advisor Direct Dial [phone]

## 2023-02-21 NOTE — Progress Notes (Signed)
BP 134/86   Pulse 100   Temp 97.9 F (36.6 C) (Oral)   Resp 18   Ht 5\' 4"  (1.626 m)   Wt 229 lb 12.8 oz (104.2 kg)   SpO2 97%   BMI 39.45 kg/m    Subjective:    Patient ID: Jessica Espinoza, female    DOB: 1986-03-10, 37 y.o.   MRN: [id number]  HPI: Jessica Espinoza is a 37 y.o. female  Chief Complaint  Patient presents with   Hypertension   Follow-up    Gastric Surgery on 5/20   HTN: her blood pressure today is 134/86. She is currently on olmesartan 40 mg daily. She says she was told to hold her blood pressure medication due to surgery. She says she has been keeping a log of her blood pressure and it has been running 140s/90s, recommend she restart her blood pressure medication and continue to monitor blood pressure readings.  She denies any blurred vision, headaches, chest pain or shortness of breath.   Obesity: her weight today is 229 lbs with a BMI of 39.45.  She recently had bariatric surgery on 02/18/2023. She reports she is doing well. Has follow up appointment with surgery on 6/ 13/2024  Depression/anxiety/ADHD: she is currently being seen by psychiatry,Dr. Elna Breslow.  She is currently on zoloft 100 mg daily.  She is going to get genesight testing done with psychiatry.  She is also on Current new medications like Vyvanse and Adderall extended release for ADHD.     02/22/2023    8:50 AM 02/11/2023    3:59 PM 01/31/2023    1:24 PM 01/31/2023    1:21 PM 12/06/2022    7:28 PM  Depression screen PHQ 2/9  Decreased Interest 0 1 0 0 1  Down, Depressed, Hopeless 0 1 0 0 0  PHQ - 2 Score 0 2 0 0 1  Altered sleeping 0 3 0  1  Tired, decreased energy 0 2 0  0  Change in appetite 0 2 0  0  Feeling bad or failure about yourself  0 0 0  0  Trouble concentrating 0 3 0  1  Moving slowly or fidgety/restless 0 0 0  0  Suicidal thoughts 0 0 0  0  PHQ-9 Score 0 12 0  3  Difficult doing work/chores Not difficult at all Very difficult Not difficult at all         02/22/2023    8:50 AM  02/11/2023    4:00 PM 01/31/2023    1:24 PM 11/15/2022    9:17 AM  GAD 7 : Generalized Anxiety Score  Nervous, Anxious, on Edge 0 2 0 1  Control/stop worrying 0 1 0 1  Worry too much - different things 0 2 0 1  Trouble relaxing 0 1 0 0  Restless 0 1 0 0  Easily annoyed or irritable 0 3 0 1  Afraid - awful might happen 0 2 0 1  Total GAD 7 Score 0 12 0 5  Anxiety Difficulty Not difficult at all Very difficult Not difficult at all Somewhat difficult      Relevant past medical, surgical, family and social history reviewed and updated as indicated. Interim medical history since our last visit reviewed. Allergies and medications reviewed and updated.  Review of Systems  Constitutional: Negative for fever or weight change.  Respiratory: Negative for cough and shortness of breath.   Cardiovascular: Negative for chest pain or palpitations.  Gastrointestinal: Negative for abdominal  pain, no bowel changes.  Musculoskeletal: Negative for gait problem or joint swelling.  Skin: Negative for rash.  Neurological: Negative for dizziness or headache.  No other specific complaints in a complete review of systems (except as listed in HPI above).      Objective:    BP 134/86   Pulse 100   Temp 97.9 F (36.6 C) (Oral)   Resp 18   Ht 5\' 4"  (1.626 m)   Wt 229 lb 12.8 oz (104.2 kg)   SpO2 97%   BMI 39.45 kg/m   Wt Readings from Last 3 Encounters:  02/22/23 229 lb 12.8 oz (104.2 kg)  02/18/23 233 lb 11 oz (106 kg)  02/11/23 232 lb 9.6 oz (105.5 kg)    Physical Exam  Constitutional: Patient appears well-developed and well-nourished. Obese  No distress.  HEENT: head atraumatic, normocephalic, pupils equal and reactive to light, neck supple Cardiovascular: Normal rate, regular rhythm and normal heart sounds.  No murmur heard. No BLE edema. Pulmonary/Chest: Effort normal and breath sounds normal. No respiratory distress. Abdominal: Soft.  There is no tenderness. Psychiatric: Patient has a  normal mood and affect. behavior is normal. Judgment and thought content normal.      Assessment & Plan:   Problem List Items Addressed This Visit       Cardiovascular and Mediastinum   Essential hypertension - Primary    Restart olmesartan 40 mg daily. Continue to monitor blood pressure and keep log.         Other   Obesity    Recently had gastric sleeve, doing well, has f/u with surgeon on 03/14/2023.      Adult ADHD    Managed by Dr. Elna Breslow on vyvanse and adderall      GAD (generalized anxiety disorder)    Managed by Dr. Elna Breslow, continue zoloft 100 mg daily      Moderate episode of recurrent major depressive disorder (HCC)    Managed by Dr. Elna Breslow, continue zoloft 100 mg daily        Follow up plan: Return in about 4 months (around 06/25/2023) for follow up.

## 2023-02-22 ENCOUNTER — Ambulatory Visit (INDEPENDENT_AMBULATORY_CARE_PROVIDER_SITE_OTHER): Payer: 59 | Admitting: Nurse Practitioner

## 2023-02-22 ENCOUNTER — Other Ambulatory Visit: Payer: Self-pay

## 2023-02-22 VITALS — BP 134/86 | HR 100 | Temp 97.9°F | Resp 18 | Ht 64.0 in | Wt 229.8 lb

## 2023-02-22 DIAGNOSIS — I10 Essential (primary) hypertension: Secondary | ICD-10-CM

## 2023-02-22 DIAGNOSIS — Z6838 Body mass index (BMI) 38.0-38.9, adult: Secondary | ICD-10-CM | POA: Diagnosis not present

## 2023-02-22 DIAGNOSIS — F411 Generalized anxiety disorder: Secondary | ICD-10-CM | POA: Diagnosis not present

## 2023-02-22 DIAGNOSIS — F331 Major depressive disorder, recurrent, moderate: Secondary | ICD-10-CM

## 2023-02-22 DIAGNOSIS — F909 Attention-deficit hyperactivity disorder, unspecified type: Secondary | ICD-10-CM | POA: Diagnosis not present

## 2023-02-22 NOTE — Assessment & Plan Note (Signed)
Managed by Dr. Elna Breslow on vyvanse and adderall

## 2023-02-22 NOTE — Assessment & Plan Note (Signed)
Managed by Dr. Elna Breslow, continue zoloft 100 mg daily

## 2023-02-22 NOTE — Assessment & Plan Note (Signed)
Recently had gastric sleeve, doing well, has f/u with surgeon on 03/14/2023.

## 2023-02-22 NOTE — Assessment & Plan Note (Signed)
Restart olmesartan 40 mg daily. Continue to monitor blood pressure and keep log.

## 2023-02-22 NOTE — Assessment & Plan Note (Signed)
Managed by Dr. Eappen, continue zoloft 100 mg daily 

## 2023-03-01 ENCOUNTER — Telehealth (HOSPITAL_COMMUNITY): Payer: Self-pay | Admitting: *Deleted

## 2023-03-01 NOTE — Telephone Encounter (Signed)
Voicemail left.

## 2023-03-05 ENCOUNTER — Encounter: Payer: Self-pay | Admitting: Skilled Nursing Facility1

## 2023-03-05 ENCOUNTER — Encounter: Payer: Commercial Managed Care - PPO | Attending: Surgery | Admitting: Skilled Nursing Facility1

## 2023-03-05 VITALS — Wt 217.6 lb

## 2023-03-05 DIAGNOSIS — E669 Obesity, unspecified: Secondary | ICD-10-CM | POA: Diagnosis present

## 2023-03-05 NOTE — Progress Notes (Signed)
2 Week Post-Operative Nutrition Class   Patient was seen on 03/05/2023 for Post-Operative Nutrition education at the [facility].    Anthropometrics  Start weight at [facility]: 231.4 lbs (date: 11/23/2022)     Clinical  Pharmacotherapy: History of weight loss medication used: [facility] hx: HTN Medications: losartan, vyvanse, sertraline, amplilify, Vit D, multivitamin, probiotic  Labs: HDL 48; triglycerides 170; glucose 104 Notable signs/symptoms: none noted Any previous deficiencies? No Bowel Habits: Every day to every other day no complaints   Body Composition Scale 03/05/2023  Current Body Weight 217.6  Total Body Fat % 40.9  Visceral Fat 11  Fat-Free Mass % 59   Total Body Water % 44  Muscle-Mass lbs 32.3  BMI 37.2  Body Fat Displacement          Torso  lbs 55.2         Left Leg  lbs 11.0         Right Leg  lbs 11.0         Left Arm  lbs 5.5         Right Arm  lbs 5.5      The following the learning objectives were met by the patient during this course: Identifies Soft Prepped Plan Advancement Guide  Identifies Soft, High Proteins (Phase 1), beginning 2 weeks post-operatively to 3 weeks post-operatively Identifies Additional Soft High Proteins, soft non-starchy vegetables, fruits and starches (Phase 2), beginning 3 weeks post-operatively to 3 months post-operatively Identifies appropriate sources of fluids, proteins, vegetables, fruits and starches Identifies appropriate fat sources and healthy verses unhealthy fat types   States protein, vegetable, fruit and starch recommendations and appropriate sources post-operatively Identifies the need for appropriate texture modifications, mastication, and bite sizes when consuming solids Identifies appropriate fat consumption and sources Identifies appropriate multivitamin and calcium sources post-operatively Describes the need for physical activity post-operatively and will follow MD  recommendations States when to call healthcare provider regarding medication questions or post-operative complications   Handouts given during class include: Soft Prepped Plan Advancement Guide   Follow-Up Plan: Patient will follow-up at [facility] in 10 weeks for 3 month post-op nutrition visit for diet advancement per MD.

## 2023-03-06 ENCOUNTER — Other Ambulatory Visit: Payer: Self-pay | Admitting: Nurse Practitioner

## 2023-03-06 ENCOUNTER — Other Ambulatory Visit: Payer: Self-pay

## 2023-03-06 DIAGNOSIS — F331 Major depressive disorder, recurrent, moderate: Secondary | ICD-10-CM

## 2023-03-06 DIAGNOSIS — F411 Generalized anxiety disorder: Secondary | ICD-10-CM

## 2023-03-06 MED ORDER — AMPHETAMINE-DEXTROAMPHETAMINE 20 MG PO TABS
10.0000 mg | ORAL_TABLET | Freq: Every day | ORAL | 0 refills | Status: DC
  Filled 2023-03-06: qty 30, 30d supply, fill #0

## 2023-03-06 MED ORDER — LISDEXAMFETAMINE DIMESYLATE 60 MG PO CAPS
60.0000 mg | ORAL_CAPSULE | Freq: Every morning | ORAL | 0 refills | Status: DC
  Filled 2023-03-06: qty 20, 20d supply, fill #0
  Filled 2023-03-06 (×3): qty 10, 10d supply, fill #0

## 2023-03-07 ENCOUNTER — Other Ambulatory Visit: Payer: Self-pay

## 2023-03-07 ENCOUNTER — Other Ambulatory Visit: Payer: Self-pay | Admitting: Nurse Practitioner

## 2023-03-07 DIAGNOSIS — F411 Generalized anxiety disorder: Secondary | ICD-10-CM

## 2023-03-07 DIAGNOSIS — F331 Major depressive disorder, recurrent, moderate: Secondary | ICD-10-CM

## 2023-03-07 NOTE — Telephone Encounter (Signed)
Requested medication (s) are due for refill today: Yes  Requested medication (s) are on the active medication list: Yes  Last refill:  Aluminum chloride 01/04/22, Abilify 01/21/23  Future visit scheduled: No  Notes to clinic:  Unable to refill per protocol, cannot delegate. Unable to refill due to no refill protocol for this medication Aluminum Chloride.     Requested Prescriptions  Pending Prescriptions Disp Refills   DRYSOL 20 % external solution [Pharmacy Med Name: aluminum chloride (DRYSOL) 20 % external solution] 60 mL 1    Sig: Apply solution sparingly topically once weekly.     Off-Protocol Failed - 03/07/2023  8:19 AM      Failed - Medication not assigned to a protocol, review manually.      Passed - Valid encounter within last 12 months    Recent Outpatient Visits           1 week ago Essential hypertension   [facility] Della Goo F, FNP   1 month ago Chronic right shoulder pain   [facility] Della Goo F, FNP   3 months ago Moderate episode of recurrent major depressive disorder [facility])   [facility] Berniece Salines, FNP   4 months ago Moderate episode of recurrent major depressive disorder [facility])   [facility] Berniece Salines, FNP   5 months ago Essential hypertension   [facility] Della Goo F, FNP               ARIPiprazole (ABILIFY) 2 MG tablet 30 tablet 0    Sig: Take 1 tablet (2 mg total) by mouth daily.     Not Delegated - Psychiatry:  Antipsychotics - Second Generation (Atypical) - aripiprazole Failed - 03/07/2023  8:19 AM      Failed - This refill cannot be delegated      Failed - TSH in normal range and within 360 days    No results found for: "TSH", "POCTSH", "TSHREFLEX"       Failed - Lipid Panel in normal range within the last 12 months    Cholesterol  Date Value Ref Range Status  10/18/2022 157 <200 mg/dL  Final   LDL Cholesterol (Calc)  Date Value Ref Range Status  10/18/2022 82 mg/dL (calc) Final    Comment:    Reference range: <100 . Desirable range <100 mg/dL for primary prevention;   <70 mg/dL for patients with CHD or diabetic patients  with > or = 2 CHD risk factors. Marland Kitchen LDL-C is now calculated using the Martin-Hopkins  calculation, which is a validated novel method providing  better accuracy than the Friedewald equation in the  estimation of LDL-C.  Horald Pollen et al. Lenox Ahr. 1610;960(45): 2061-2068  (http://education.[employer].com/faq/FAQ164)    HDL  Date Value Ref Range Status  10/18/2022 48 (L) > OR = 50 mg/dL Final   Triglycerides  Date Value Ref Range Status  10/18/2022 170 (H) <150 mg/dL Final         Passed - Completed PHQ-2 or PHQ-9 in the last 360 days      Passed - Last BP in normal range    BP Readings from Last 1 Encounters:  02/22/23 134/86         Passed - Last Heart Rate in normal range    Pulse Readings from Last 1 Encounters:  02/22/23 100         Passed - Valid encounter  within last 6 months    Recent Outpatient Visits           1 week ago Essential hypertension   [place] [facility] Della Goo F, FNP   1 month ago Chronic right shoulder pain   [place] [facility] Berniece Salines, FNP   3 months ago Moderate episode of recurrent major depressive disorder [facility])   [place] Berniece Salines, FNP   4 months ago Moderate episode of recurrent major depressive disorder [facility])   [facility] Berniece Salines, FNP   5 months ago Essential hypertension   [facility] Berniece Salines, FNP              Passed - CBC within normal limits and completed in the last 12 months    WBC  Date Value Ref Range Status  02/19/2023 13.3 (H) 4.0 - 10.5 K/uL Final   RBC  Date Value Ref Range Status  02/19/2023 4.48 3.87 - 5.11 MIL/uL  Final   Hemoglobin  Date Value Ref Range Status  02/19/2023 11.9 (L) 12.0 - 15.0 g/dL Final   HGB  Date Value Ref Range Status  09/13/2013 12.7 12.0 - 16.0 g/dL Final   HCT  Date Value Ref Range Status  02/19/2023 36.3 36.0 - 46.0 % Final  09/14/2013 33.6 (L) 35.0 - 47.0 % Final   MCHC  Date Value Ref Range Status  02/19/2023 32.8 30.0 - 36.0 g/dL Final   [facility]  Date Value Ref Range Status  02/19/2023 26.6 26.0 - 34.0 pg Final   MCV  Date Value Ref Range Status  02/19/2023 81.0 80.0 - 100.0 fL Final  09/13/2013 81 80 - 100 fL Final   No results found for: "PLTCOUNTKUC", "LABPLAT", "POCPLA" RDW  Date Value Ref Range Status  02/19/2023 12.8 11.5 - 15.5 % Final  09/13/2013 14.5 11.5 - 14.5 % Final         Passed - CMP within normal limits and completed in the last 12 months    Albumin  Date Value Ref Range Status  02/19/2023 3.8 3.5 - 5.0 g/dL Final   Alkaline Phosphatase  Date Value Ref Range Status  02/19/2023 75 38 - 126 U/L Final   Alkaline phosphatase (APISO)  Date Value Ref Range Status  10/18/2022 84 31 - 125 U/L Final   ALT  Date Value Ref Range Status  02/19/2023 27 0 - 44 U/L Final   AST  Date Value Ref Range Status  02/19/2023 24 15 - 41 U/L Final   BUN  Date Value Ref Range Status  02/19/2023 8 6 - 20 mg/dL Final   Calcium  Date Value Ref Range Status  02/19/2023 8.7 (L) 8.9 - 10.3 mg/dL Final   Calcium, Ion  Date Value Ref Range Status  07/15/2019 1.15 1.15 - 1.40 mmol/L Final   CO2  Date Value Ref Range Status  02/19/2023 26 22 - 32 mmol/L Final   Bicarbonate  Date Value Ref Range Status  07/15/2019 23.4 20.0 - 28.0 mmol/L Final   TCO2  Date Value Ref Range Status  07/15/2019 25 22 - 32 mmol/L Final   Creat  Date Value Ref Range Status  10/18/2022 0.55 0.50 - 0.97 mg/dL Final   Creatinine, Ser  Date Value Ref Range Status  02/19/2023 0.55 0.44 - 1.00 mg/dL Final   Creatinine, Urine  Date Value Ref Range Status   09/26/2015 160  mg/dL Final   Glucose, Bld  Date Value Ref Range Status  02/19/2023 123 (H) 70 - 99 mg/dL Final    Comment:    Glucose reference range applies only to samples taken after fasting for at least 8 hours.   Potassium  Date Value Ref Range Status  02/19/2023 4.4 3.5 - 5.1 mmol/L Final   Sodium  Date Value Ref Range Status  02/19/2023 135 135 - 145 mmol/L Final   Total Bilirubin  Date Value Ref Range Status  02/19/2023 0.6 0.3 - 1.2 mg/dL Final   Protein, ur  Date Value Ref Range Status  07/17/2019 NEGATIVE NEGATIVE mg/dL Final   Total Protein, Urine  Date Value Ref Range Status  09/26/2015 43 mg/dL Final    Comment:    NO NORMAL RANGE ESTABLISHED FOR THIS TEST   Total Protein  Date Value Ref Range Status  02/19/2023 7.0 6.5 - 8.1 g/dL Final   GFR calc Af Amer  Date Value Ref Range Status  07/18/2019 >60 >60 mL/min Final   eGFR  Date Value Ref Range Status  10/18/2022 123 > OR = 60 mL/min/1.72m2 Final   GFR, Estimated  Date Value Ref Range Status  02/19/2023 >60 >60 mL/min Final    Comment:    (NOTE) Calculated using the CKD-EPI Creatinine Equation (2021)

## 2023-03-08 ENCOUNTER — Other Ambulatory Visit: Payer: Self-pay

## 2023-03-08 MED FILL — Aripiprazole Tab 2 MG: ORAL | 30 days supply | Qty: 30 | Fill #0 | Status: CN

## 2023-03-08 MED FILL — Aluminum Chloride Soln 20%: CUTANEOUS | 90 days supply | Qty: 60 | Fill #0 | Status: CN

## 2023-03-15 ENCOUNTER — Telehealth: Payer: Self-pay | Admitting: Skilled Nursing Facility1

## 2023-03-15 NOTE — Telephone Encounter (Signed)
RD called pt to verify fluid intake once starting soft, solid proteins 2 week post-bariatric surgery.   Daily Fluid intake:  Daily Protein intake: Bowel Habits:   Concerns/issues:    LVM 

## 2023-03-21 ENCOUNTER — Telehealth (HOSPITAL_COMMUNITY): Payer: Self-pay | Admitting: *Deleted

## 2023-03-21 NOTE — Telephone Encounter (Signed)
Call sent to voicemail. Voicemail left with return number.

## 2023-03-28 ENCOUNTER — Other Ambulatory Visit: Payer: Self-pay

## 2023-04-11 ENCOUNTER — Other Ambulatory Visit: Payer: Self-pay | Admitting: Oncology

## 2023-04-11 DIAGNOSIS — Z006 Encounter for examination for normal comparison and control in clinical research program: Secondary | ICD-10-CM

## 2023-04-26 ENCOUNTER — Other Ambulatory Visit: Payer: Self-pay

## 2023-04-26 ENCOUNTER — Other Ambulatory Visit: Payer: Self-pay | Admitting: Nurse Practitioner

## 2023-04-26 DIAGNOSIS — F411 Generalized anxiety disorder: Secondary | ICD-10-CM

## 2023-04-26 DIAGNOSIS — F331 Major depressive disorder, recurrent, moderate: Secondary | ICD-10-CM

## 2023-04-26 MED ORDER — LISDEXAMFETAMINE DIMESYLATE 60 MG PO CAPS
60.0000 mg | ORAL_CAPSULE | Freq: Every morning | ORAL | 0 refills | Status: DC
  Filled 2023-04-26: qty 30, 30d supply, fill #0

## 2023-04-26 MED ORDER — SERTRALINE HCL 100 MG PO TABS
100.0000 mg | ORAL_TABLET | Freq: Every day | ORAL | 0 refills | Status: DC
Start: 2023-04-26 — End: 2023-06-20
  Filled 2023-04-26: qty 90, 90d supply, fill #0

## 2023-04-26 MED FILL — Aripiprazole Tab 2 MG: ORAL | 30 days supply | Qty: 30 | Fill #0 | Status: AC

## 2023-04-26 MED FILL — Aluminum Chloride Soln 20%: CUTANEOUS | 30 days supply | Qty: 60 | Fill #0 | Status: AC

## 2023-04-26 NOTE — Telephone Encounter (Signed)
Requested Prescriptions  Pending Prescriptions Disp Refills   sertraline (ZOLOFT) 100 MG tablet 90 tablet 0    Sig: Take 1 tablet (100 mg total) by mouth daily.     Psychiatry:  Antidepressants - SSRI - sertraline Passed - 04/26/2023  2:11 PM      Passed - AST in normal range and within 360 days    AST  Date Value Ref Range Status  02/19/2023 24 15 - 41 U/L Final         Passed - ALT in normal range and within 360 days    ALT  Date Value Ref Range Status  02/19/2023 27 0 - 44 U/L Final         Passed - Completed PHQ-2 or PHQ-9 in the last 360 days      Passed - Valid encounter within last 6 months    Recent Outpatient Visits           2 months ago Essential hypertension   [facility] Della Goo F, FNP   2 months ago Chronic right shoulder pain   [facility] Della Goo F, FNP   5 months ago Moderate episode of recurrent major depressive disorder [facility])   [facility] Della Goo F, FNP   6 months ago Moderate episode of recurrent major depressive disorder [facility])   [facility] Berniece Salines, FNP   7 months ago Essential hypertension   [facility] Berniece Salines, [place]

## 2023-05-03 DIAGNOSIS — F902 Attention-deficit hyperactivity disorder, combined type: Secondary | ICD-10-CM | POA: Diagnosis not present

## 2023-05-03 DIAGNOSIS — Z79899 Other long term (current) drug therapy: Secondary | ICD-10-CM | POA: Diagnosis not present

## 2023-05-06 DIAGNOSIS — F902 Attention-deficit hyperactivity disorder, combined type: Secondary | ICD-10-CM | POA: Diagnosis not present

## 2023-05-14 ENCOUNTER — Ambulatory Visit: Payer: 59 | Admitting: Psychiatry

## 2023-05-15 ENCOUNTER — Ambulatory Visit: Payer: 59 | Admitting: Psychiatry

## 2023-05-23 DIAGNOSIS — Z9884 Bariatric surgery status: Secondary | ICD-10-CM | POA: Diagnosis not present

## 2023-05-23 DIAGNOSIS — F909 Attention-deficit hyperactivity disorder, unspecified type: Secondary | ICD-10-CM | POA: Diagnosis not present

## 2023-05-23 DIAGNOSIS — I10 Essential (primary) hypertension: Secondary | ICD-10-CM | POA: Diagnosis not present

## 2023-05-23 DIAGNOSIS — Z8659 Personal history of other mental and behavioral disorders: Secondary | ICD-10-CM | POA: Diagnosis not present

## 2023-05-23 DIAGNOSIS — F419 Anxiety disorder, unspecified: Secondary | ICD-10-CM | POA: Diagnosis not present

## 2023-05-23 DIAGNOSIS — K760 Fatty (change of) liver, not elsewhere classified: Secondary | ICD-10-CM | POA: Diagnosis not present

## 2023-06-06 ENCOUNTER — Ambulatory Visit: Payer: 59 | Admitting: Dietician

## 2023-06-18 ENCOUNTER — Other Ambulatory Visit: Payer: Self-pay

## 2023-06-18 ENCOUNTER — Other Ambulatory Visit: Payer: Self-pay | Admitting: Nurse Practitioner

## 2023-06-18 DIAGNOSIS — I10 Essential (primary) hypertension: Secondary | ICD-10-CM

## 2023-06-18 DIAGNOSIS — F411 Generalized anxiety disorder: Secondary | ICD-10-CM

## 2023-06-18 DIAGNOSIS — F331 Major depressive disorder, recurrent, moderate: Secondary | ICD-10-CM

## 2023-06-18 MED FILL — Aluminum Chloride Soln 20%: CUTANEOUS | 30 days supply | Qty: 60 | Fill #1 | Status: CN

## 2023-06-18 MED FILL — Aluminum Chloride Soln 20%: CUTANEOUS | 30 days supply | Qty: 60 | Fill #1 | Status: AC

## 2023-06-18 NOTE — Progress Notes (Deleted)
There were no vitals taken for this visit.   Subjective:    Patient ID: Jessica Espinoza, female    DOB: 03-Sep-1986, 37 y.o.   MRN: [id number]  HPI: Jessica Espinoza is a 37 y.o. female  No chief complaint on file.  Depression/anxiety/ADHD: she is currently being seen by psychiatry,Dr. Elna Breslow.  She was last seen on  02/11/2023. She is currently on zoloft 100 mg daily.  She is going to get genesight testing done with psychiatry.  She is also on Current new medications like Vyvanse and Adderall extended release for ADHD.   Relevant past medical, surgical, family and social history reviewed and updated as indicated. Interim medical history since our last visit reviewed. Allergies and medications reviewed and updated.  Review of Systems  Constitutional: Negative for fever or weight change.  Respiratory: Negative for cough and shortness of breath.   Cardiovascular: Negative for chest pain or palpitations.  Gastrointestinal: Negative for abdominal pain, no bowel changes.  Musculoskeletal: Negative for gait problem or joint swelling.  Skin: Negative for rash.  Neurological: Negative for dizziness or headache.  No other specific complaints in a complete review of systems (except as listed in HPI above).      Objective:    There were no vitals taken for this visit.  Wt Readings from Last 3 Encounters:  03/05/23 217 lb 9.6 oz (98.7 kg)  02/22/23 229 lb 12.8 oz (104.2 kg)  02/18/23 233 lb 11 oz (106 kg)    Physical Exam  Constitutional: Patient appears well-developed and well-nourished. Obese *** No distress.  HEENT: head atraumatic, normocephalic, pupils equal and reactive to light, ears ***, neck supple, throat within normal limits Cardiovascular: Normal rate, regular rhythm and normal heart sounds.  No murmur heard. No BLE edema. Pulmonary/Chest: Effort normal and breath sounds normal. No respiratory distress. Abdominal: Soft.  There is no tenderness. Psychiatric: Patient has a  normal mood and affect. behavior is normal. Judgment and thought content normal.  Results for orders placed or performed during the hospital encounter of 02/18/23  CBC  Result Value Ref Range   WBC 13.3 (H) 4.0 - 10.5 K/uL   RBC 4.48 3.87 - 5.11 MIL/uL   Hemoglobin 11.9 (L) 12.0 - 15.0 g/dL   HCT 86.5 78.4 - 69.6 %   MCV 81.0 80.0 - 100.0 fL   MCH 26.6 26.0 - 34.0 pg   MCHC 32.8 30.0 - 36.0 g/dL   RDW 29.5 28.4 - 13.2 %   Platelets 272 150 - 400 K/uL   nRBC 0.0 0.0 - 0.2 %  Magnesium  Result Value Ref Range   Magnesium 1.9 1.7 - 2.4 mg/dL  Comprehensive metabolic panel  Result Value Ref Range   Sodium 135 135 - 145 mmol/L   Potassium 4.4 3.5 - 5.1 mmol/L   Chloride 100 98 - 111 mmol/L   CO2 26 22 - 32 mmol/L   Glucose, Bld 123 (H) 70 - 99 mg/dL   BUN 8 6 - 20 mg/dL   Creatinine, Ser 4.40 0.44 - 1.00 mg/dL   Calcium 8.7 (L) 8.9 - 10.3 mg/dL   Total Protein 7.0 6.5 - 8.1 g/dL   Albumin 3.8 3.5 - 5.0 g/dL   AST 24 15 - 41 U/L   ALT 27 0 - 44 U/L   Alkaline Phosphatase 75 38 - 126 U/L   Total Bilirubin 0.6 0.3 - 1.2 mg/dL   GFR, Estimated >10 >27 mL/min   Anion gap 9 5 - 15  Pregnancy, urine POC  Result Value Ref Range   Preg Test, Ur NEGATIVE NEGATIVE  Surgical pathology  Result Value Ref Range   SURGICAL PATHOLOGY      SURGICAL PATHOLOGY CASE: [id number] PATIENT: Evadne Large Surgical Pathology Report     Clinical History: Morbid obesity (crm)     FINAL MICROSCOPIC DIAGNOSIS:  A. STOMACH, GREATER CURVATURE, SLEEVE RESECTION: - Gross diagnosis only: Portion of unremarkable stomach.  GROSS DESCRIPTION:  Specimen: Received fresh is a portion of stomach with a staple line along 1 aspect Size: 19 x 4.5 x 3.0 cm Serosa: Pink-tan, smooth, and intact Mucosa/Wall: Pink-tan and rugated without distinct lesions. The specimen is for gross examination only and no sections are submitted to histology. (KW, 02/18/2023)   Final Diagnosis performed by Orene Desanctis DO.   Electronically signed 02/19/2023 Technical and / or Professional components performed at [facility], [address] [place], [place] [address].  Immunohistochemistry Technical component (if applicable) was performed at [facility]. [address], [address], [place], [place] [address].   IMMUNOHISTOCHEMISTRY DISCLAIMER (if applicable): Some of these immunohistochemical stains may have been developed and the performance characteristics determine by [facility]. Some may not have been cleared or approved by the U.S. Food and Drug Administration. The FDA has determined that such clearance or approval is not necessary. This test is used for clinical purposes. It should not be regarded as investigational or for research. This laboratory is certified under the Clinical Laboratory Improvement Amendments of 1988 (CLIA-88) as qualified to perform high complexity clinical laboratory testing.  The controls stained appropriately.       Assessment & Plan:   Problem List Items Addressed This Visit   None    Follow up plan: No follow-ups on file.

## 2023-06-19 ENCOUNTER — Ambulatory Visit: Payer: 59 | Admitting: Nurse Practitioner

## 2023-06-19 ENCOUNTER — Other Ambulatory Visit: Payer: Self-pay

## 2023-06-19 MED ORDER — AMPHETAMINE-DEXTROAMPHETAMINE 20 MG PO TABS
10.0000 mg | ORAL_TABLET | Freq: Every day | ORAL | 0 refills | Status: AC
  Filled 2023-06-19: qty 30, 30d supply, fill #0

## 2023-06-19 MED ORDER — LISDEXAMFETAMINE DIMESYLATE 60 MG PO CAPS
60.0000 mg | ORAL_CAPSULE | Freq: Every morning | ORAL | 0 refills | Status: AC
  Filled 2023-06-19: qty 30, 30d supply, fill #0

## 2023-06-19 NOTE — Progress Notes (Deleted)
There were no vitals taken for this visit.   Subjective:    Patient ID: Jessica Espinoza, female    DOB: 03-Sep-1986, 37 y.o.   MRN: [id number]  HPI: Jessica Espinoza is a 37 y.o. female  No chief complaint on file.  Depression/anxiety/ADHD: she is currently being seen by psychiatry,Dr. Elna Breslow.  She was last seen on  02/11/2023. She is currently on zoloft 100 mg daily.  She is going to get genesight testing done with psychiatry.  She is also on Current new medications like Vyvanse and Adderall extended release for ADHD.   Relevant past medical, surgical, family and social history reviewed and updated as indicated. Interim medical history since our last visit reviewed. Allergies and medications reviewed and updated.  Review of Systems  Constitutional: Negative for fever or weight change.  Respiratory: Negative for cough and shortness of breath.   Cardiovascular: Negative for chest pain or palpitations.  Gastrointestinal: Negative for abdominal pain, no bowel changes.  Musculoskeletal: Negative for gait problem or joint swelling.  Skin: Negative for rash.  Neurological: Negative for dizziness or headache.  No other specific complaints in a complete review of systems (except as listed in HPI above).      Objective:    There were no vitals taken for this visit.  Wt Readings from Last 3 Encounters:  03/05/23 217 lb 9.6 oz (98.7 kg)  02/22/23 229 lb 12.8 oz (104.2 kg)  02/18/23 233 lb 11 oz (106 kg)    Physical Exam  Constitutional: Patient appears well-developed and well-nourished. Obese *** No distress.  HEENT: head atraumatic, normocephalic, pupils equal and reactive to light, ears ***, neck supple, throat within normal limits Cardiovascular: Normal rate, regular rhythm and normal heart sounds.  No murmur heard. No BLE edema. Pulmonary/Chest: Effort normal and breath sounds normal. No respiratory distress. Abdominal: Soft.  There is no tenderness. Psychiatric: Patient has a  normal mood and affect. behavior is normal. Judgment and thought content normal.  Results for orders placed or performed during the hospital encounter of 02/18/23  CBC  Result Value Ref Range   WBC 13.3 (H) 4.0 - 10.5 K/uL   RBC 4.48 3.87 - 5.11 MIL/uL   Hemoglobin 11.9 (L) 12.0 - 15.0 g/dL   HCT 86.5 78.4 - 69.6 %   MCV 81.0 80.0 - 100.0 fL   MCH 26.6 26.0 - 34.0 pg   MCHC 32.8 30.0 - 36.0 g/dL   RDW 29.5 28.4 - 13.2 %   Platelets 272 150 - 400 K/uL   nRBC 0.0 0.0 - 0.2 %  Magnesium  Result Value Ref Range   Magnesium 1.9 1.7 - 2.4 mg/dL  Comprehensive metabolic panel  Result Value Ref Range   Sodium 135 135 - 145 mmol/L   Potassium 4.4 3.5 - 5.1 mmol/L   Chloride 100 98 - 111 mmol/L   CO2 26 22 - 32 mmol/L   Glucose, Bld 123 (H) 70 - 99 mg/dL   BUN 8 6 - 20 mg/dL   Creatinine, Ser 4.40 0.44 - 1.00 mg/dL   Calcium 8.7 (L) 8.9 - 10.3 mg/dL   Total Protein 7.0 6.5 - 8.1 g/dL   Albumin 3.8 3.5 - 5.0 g/dL   AST 24 15 - 41 U/L   ALT 27 0 - 44 U/L   Alkaline Phosphatase 75 38 - 126 U/L   Total Bilirubin 0.6 0.3 - 1.2 mg/dL   GFR, Estimated >10 >27 mL/min   Anion gap 9 5 - 15  Pregnancy, urine POC  Result Value Ref Range   Preg Test, Ur NEGATIVE NEGATIVE  Surgical pathology  Result Value Ref Range   SURGICAL PATHOLOGY      SURGICAL PATHOLOGY CASE: [id number] PATIENT: Jessica Espinoza Surgical Pathology Report     Clinical History: Morbid obesity (crm)     FINAL MICROSCOPIC DIAGNOSIS:  A. STOMACH, GREATER CURVATURE, SLEEVE RESECTION: - Gross diagnosis only: Portion of unremarkable stomach.  GROSS DESCRIPTION:  Specimen: Received fresh is a portion of stomach with a staple line along 1 aspect Size: 19 x 4.5 x 3.0 cm Serosa: Pink-tan, smooth, and intact Mucosa/Wall: Pink-tan and rugated without distinct lesions. The specimen is for gross examination only and no sections are submitted to histology. (KW, 02/18/2023)   Final Diagnosis performed by Orene Desanctis DO.   Electronically signed 02/19/2023 Technical and / or Professional components performed at [facility], [address] [place], [place] [address].  Immunohistochemistry Technical component (if applicable) was performed at [facility]. [address], [address], [place], [place] [address].   IMMUNOHISTOCHEMISTRY DISCLAIMER (if applicable): Some of these immunohistochemical stains may have been developed and the performance characteristics determine by [facility]. Some may not have been cleared or approved by the U.S. Food and Drug Administration. The FDA has determined that such clearance or approval is not necessary. This test is used for clinical purposes. It should not be regarded as investigational or for research. This laboratory is certified under the Clinical Laboratory Improvement Amendments of 1988 (CLIA-88) as qualified to perform high complexity clinical laboratory testing.  The controls stained appropriately.       Assessment & Plan:   Problem List Items Addressed This Visit   None    Follow up plan: No follow-ups on file.

## 2023-06-20 ENCOUNTER — Other Ambulatory Visit: Payer: Self-pay

## 2023-06-20 ENCOUNTER — Ambulatory Visit: Payer: 59 | Admitting: Nurse Practitioner

## 2023-06-20 MED FILL — Olmesartan Medoxomil Tab 40 MG: ORAL | 90 days supply | Qty: 90 | Fill #0 | Status: AC

## 2023-06-20 MED FILL — Pantoprazole Sodium EC Tab 40 MG (Base Equiv): ORAL | 90 days supply | Qty: 90 | Fill #0 | Status: AC

## 2023-06-20 MED FILL — Sertraline HCl Tab 100 MG: ORAL | 90 days supply | Qty: 90 | Fill #0 | Status: CN

## 2023-06-20 NOTE — Telephone Encounter (Signed)
Requested medication (s) are due for refill today: yes  Requested medication (s) are on the active medication list: yes  Last refill:  02/19/23  Future visit scheduled: no  Notes to clinic:  Unable to refill per protocol, cannot delegate. Last refilled by another provider, routing for approval.      Requested Prescriptions  Pending Prescriptions Disp Refills   pantoprazole (PROTONIX) 40 MG tablet 90 tablet 0    Sig: Take 1 tablet (40 mg total) by mouth daily. Take this medication daily, regardless of reflux symptoms     Gastroenterology: Proton Pump Inhibitors Passed - 06/18/2023  2:54 PM      Passed - Valid encounter within last 12 months    Recent Outpatient Visits           3 months ago Essential hypertension   [facility] Della Goo F, FNP   4 months ago Chronic right shoulder pain   [facility] F, F[facility]   7 months ago Moderate episode of recurrent major depressive disorder [facility])   [facility] Berniece Salines, FNP   8 months ago Moderate episode of recurrent major depressive disorder [facility])   [facility]ce Salines, FNP   9 months ago Essential hypertension   [facility] Berniece Salines, FNP               ondansetron (ZOFRAN-ODT) 4 MG disintegrating tablet 20 tablet 0    Sig: Take 1 tablet (4 mg total) by mouth every 6 (six) hours as needed for nausea or vomiting.     Not Delegated - Gastroenterology: Antiemetics - ondansetron Failed - 06/18/2023  2:54 PM      Failed - This refill cannot be delegated      Passed - AST in normal range and within 360 days    AST  Date Value Ref Range Status  02/19/2023 24 15 - 41 U/L Final         Passed - ALT in normal range and within 360 days    ALT  Date Value Ref Range Status  02/19/2023 27 0 - 44 U/L Final         Passed - Valid encounter within last 6 months     Recent Outpatient Visits           3 months ago Essential hypertension   [facility] Della Goo F, FNP   4 months ago Chronic right shoulder pain   [facility] Della Goo F, FNP   7 months ago Moderate episode of recurrent major depressive disorder [facility])   [facility] D[facility] Goo F, FNP   8 months ago Moderate episode of recurrent major depressive disorder [facility])   [facility] Berniece Salines, FNP   9 months ago Essential hypertension   [facility] Berniece Salines, [place]              Signed Prescriptions Disp Refills   olmesartan (BENICAR) 40 MG tablet 90 tablet 0    Sig: Take 1 tablet (40 mg total) by mouth once daily     Cardiovascular:  Angiotensin Receptor Blockers Passed - 06/18/2023  2:54 PM      Passed - Cr in normal range and within 180 days    Creat  Date Value Ref Range Status  10/18/2022 0.55  0.50 - 0.97 mg/dL Final   Creatinine, Ser  Date Value Ref Range Status  02/19/2023 0.55 0.44 - 1.00 mg/dL Final   Creatinine, Urine  Date Value Ref Range Status  09/26/2015 160 mg/dL Final         Passed - K in normal range and within 180 days    Potassium  Date Value Ref Range Status  02/19/2023 4.4 3.5 - 5.1 mmol/L Final         Passed - Patient is not pregnant      Passed - Last BP in normal range    BP Readings from Last 1 Encounters:  02/22/23 134/86         Passed - Valid encounter within last 6 months    Recent Outpatient Visits           3 months ago Essential hypertension   [facility] Berniece Salines, FNP   4 months ago Chronic right shoulder pain   [facility] Della Goo F, FNP   7 months ago Moderate episode of recurrent major depressive disorder [facility])   [facility] Della Goo F, FNP   8 months ago Moderate episode of  recurrent major depressive disorder [facility])   [facility] Berniece Salines, FNP   9 months ago Essential hypertension   [facility] Della Goo F, FNP               sertraline (ZOLOFT) 100 MG tablet 90 tablet 0    Sig: Take 1 tablet (100 mg total) by mouth daily.     Psychiatry:  Antidepressants - SSRI - sertraline Passed - 06/18/2023  2:54 PM      Passed - AST in normal range and within 360 days    AST  Date Value Ref Range Status  02/19/2023 24 15 - 41 U/L Final         Passed - ALT in normal range and within 360 days    ALT  Date Value Ref Range Status  02/19/2023 27 0 - 44 U/L Final         Passed - Completed PHQ-2 or PHQ-9 in the last 360 days      Passed - Valid encounter within last 6 months    Recent Outpatient Visits           3 months ago Essential hypertension   [facility] Della Goo F, FNP   4 months ago Chronic right shoulder pain   [facility] Della Goo F, FNP   7 months ago Moderate episode of recurrent major depressive disorder [facility])   [facility] D[facility]oo F, FNP   8 months ago Moderate episode of recurrent major depressive disorder [facility])   [facility] Berniece Salines, FNP   9 months ago Essential hypertension   [facility] Berniece Salines, [place]

## 2023-06-20 NOTE — Telephone Encounter (Signed)
Requested Prescriptions  Pending Prescriptions Disp Refills   olmesartan (BENICAR) 40 MG tablet 90 tablet 0    Sig: Take 1 tablet (40 mg total) by mouth once daily     Cardiovascular:  Angiotensin Receptor Blockers Passed - 06/18/2023  2:54 PM      Passed - Cr in normal range and within 180 days    Creat  Date Value Ref Range Status  10/18/2022 0.55 0.50 - 0.97 mg/dL Final   Creatinine, Ser  Date Value Ref Range Status  02/19/2023 0.55 0.44 - 1.00 mg/dL Final   Creatinine, Urine  Date Value Ref Range Status  09/26/2015 160 mg/dL Final         Passed - K in normal range and within 180 days    Potassium  Date Value Ref Range Status  02/19/2023 4.4 3.5 - 5.1 mmol/L Final         Passed - Patient is not pregnant      Passed - Last BP in normal range    BP Readings from Last 1 Encounters:  02/22/23 134/86         Passed - Valid encounter within last 6 months    Recent Outpatient Visits           3 months ago Essential hypertension   [facility] Berniece Salines, FNP   4 months ago Chronic right shoulder pain   [facility] Berniece Salines, FNP   7 months ago Moderate episode of recurrent major depressive disorder [facility])   [facility] Della Goo F, FNP   8 months ago Moderate episode of recurrent major depressive disorder [facility])   [facility] Berniece Salines, FNP   9 months ago Essential hypertension   [facility] Della Goo F, FNP               sertraline (ZOLOFT) 100 MG tablet 90 tablet 0    Sig: Take 1 tablet (100 mg total) by mouth daily.     Psychiatry:  Antidepressants - SSRI - sertraline Passed - 06/18/2023  2:54 PM      Passed - AST in normal range and within 360 days    AST  Date Value Ref Range Status  02/19/2023 24 15 - 41 U/L Final         Passed - ALT in normal range and within 360 days    ALT  Date Value Ref  Range Status  02/19/2023 27 0 - 44 U/L Final         Passed - Completed PHQ-2 or PHQ-9 in the last 360 days      Passed - Valid encounter within last 6 months    Recent Outpatient Visits           3 months ago Essential hypertension   [facility] Della Goo F, FNP   4 months ago Chronic right shoulder pain   [facility] D[facility], FNP   7 months ago Moderate episode of recurrent major depressive disorder [facility])   [facility] Della Goo F, FNP   8 months ago Moderate episode of recurrent major depressive disorder [facility])   [facility] Berniece Salines, FNP   9 months ago Essential hypertension   [facility] Berniece Salines, [place]  pantoprazole (PROTONIX) 40 MG tablet 90 tablet 0    Sig: Take 1 tablet (40 mg total) by mouth daily. Take this medication daily, regardless of reflux symptoms     Gastroenterology: Proton Pump Inhibitors Passed - 06/18/2023  2:54 PM      Passed - Valid encounter within last 12 months    Recent Outpatient Visits           3 months ago Essential hypertension   [facility] Della Goo F, FNP   4 months ago Chronic right shoulder pain   [facility] Della G[facility] F, FNP   7 months ago Moderate episode of recurrent major depressive disorder [facility])   [facility] Berniece Salines, FNP   8 months ago Moderate episode of recurrent major depressive disorder [facility])   [facility] Berniece Salines, FNP   9 months ago Essential hypertension   [facility] Berniece Salines, FNP               ondansetron (ZOFRAN-ODT) 4 MG disintegrating tablet 20 tablet 0    Sig: Take 1 tablet (4 mg total) by mouth every 6 (six) hours as needed for nausea or vomiting.     Not Delegated -  Gastroenterology: Antiemetics - ondansetron Failed - 06/18/2023  2:54 PM      Failed - This refill cannot be delegated      Passed - AST in normal range and within 360 days    AST  Date Value Ref Range Status  02/19/2023 24 15 - 41 U/L Final         Passed - ALT in normal range and within 360 days    ALT  Date Value Ref Range Status  02/19/2023 27 0 - 44 U/L Final         Passed - Valid encounter within last 6 months    Recent Outpatient Visits           3 months ago Essential hypertension   [facility] Della Goo F, FNP   4 months ago Chronic right shoulder pain   [facility] Della Goo F, FNP   7 months ago Moderate episode of recurrent major depressive disorder [facility])   [facility] Della Goo F, FNP   8 months ago Moderate episode of recurrent major depressive disorder [facility])   [facility] Berniece Salines, FNP   9 months ago Essential hypertension   [facility] Berniece Salines, [place]

## 2023-06-20 NOTE — Telephone Encounter (Signed)
Another provider prescribes

## 2023-06-25 ENCOUNTER — Encounter: Payer: Self-pay | Admitting: Dietician

## 2023-06-25 ENCOUNTER — Encounter: Payer: 59 | Attending: Surgery | Admitting: Dietician

## 2023-06-25 VITALS — Ht 64.0 in | Wt 190.3 lb

## 2023-06-25 DIAGNOSIS — E669 Obesity, unspecified: Secondary | ICD-10-CM

## 2023-06-25 DIAGNOSIS — R634 Abnormal weight loss: Secondary | ICD-10-CM | POA: Diagnosis not present

## 2023-06-25 DIAGNOSIS — I10 Essential (primary) hypertension: Secondary | ICD-10-CM | POA: Insufficient documentation

## 2023-06-25 DIAGNOSIS — Z713 Dietary counseling and surveillance: Secondary | ICD-10-CM | POA: Diagnosis not present

## 2023-06-25 NOTE — Progress Notes (Signed)
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   Clinical  Pharmacotherapy: History of weight loss medication used: [facility] hx: HTN Medications: losartan, vyvanse, sertraline, amplilify, Vit D, multivitamin, probiotic  Labs: HDL 48; triglycerides 170; glucose 104 Notable signs/symptoms: none noted Any previous deficiencies? No  Body Composition Scale 03/05/2023 06/25/2023  Current Body Weight 217.6 190.3  Total Body Fat % 40.9 36.9  Visceral Fat 11 9  Fat-Free Mass % 59 63.0   Total Body Water % 44 46.0  Muscle-Mass lbs 32.3 32.0  BMI 37.2 32.5  Body Fat Displacement           Torso  lbs 55.2 43.4         Left Leg  lbs 11.0 8.6         Right Leg  lbs 11.0 8.6         Left Arm  lbs 5.5 4.3         Right Arm  lbs 5.5 4.3   Lifestyle & Dietary Hx  Pt states bowel habits are normal. Pt states when she walks daily it is more of a family stroll, stating she could probably increase her intensity.   Estimated daily fluid intake: 64 oz Estimated daily protein intake: 60 g Supplements: multivitamin and calcium Current average weekly physical activity: walking daily, 30-60 minutes.   24-Hr Dietary Recall First Meal: coffee protein drink Snack: egg and cheese or yogurt  Second Meal: deli meat, cheese Snack:   Third Meal: cooked broccoli, fish or chicken Snack: sometimes protein shake or half a slice of whole wheat toast with peanut butter or banana with peanut butter or fruit Beverages: water, water with flavorings, coffee (one a day), hot tea once in a while  Post-Op Goals/ Signs/ Symptoms Using straws: yes Drinking while eating: sometimes a sip Chewing/swallowing difficulties: no Changes in vision: no Changes to  mood/headaches: no Hair loss/changes to skin/nails: no Difficulty focusing/concentrating: no Sweating: no Limb weakness: no Dizziness/lightheadedness: no Palpitations: no  Carbonated/caffeinated beverages: no N/V/D/C/Gas: no Abdominal pain: no Dumping syndrome: no   NUTRITION DIAGNOSIS  Overweight/obesity (Union-3.3) related to past poor dietary habits and physical inactivity as evidenced by completed bariatric surgery and following dietary guidelines for continued weight loss and healthy nutrition status.   NUTRITION INTERVENTION Nutrition counseling (C-1) and education (E-2) to facilitate bariatric surgery goals, including: Diet advancement to the standard prep plan The importance of consuming adequate calories as well as certain nutrients daily due to the body's need for essential vitamins, minerals, and fats The importance of daily physical activity and to reach a goal of at least 150 minutes of moderate to vigorous physical activity weekly (or as directed by their physician) due to benefits such as increased musculature and improved lab values The importance of intuitive eating specifically learning hunger-satiety cues and understanding the importance of learning a new body: The importance of mindful eating to avoid grazing behaviors . Encouraged patient to honor their body's internal hunger and fullness cues.  Throughout the day, check in mentally and rate hunger. Stop eating when satisfied not full regardless of how much food is left on the plate.  Get more if still hungry 20-30 minutes later.  The key is to honor satisfaction so throughout the meal, rate fullness  factor and stop when comfortably satisfied not physically full. The key is to honor hunger and fullness without any feelings of guilt or shame.  Pay attention to what the internal cues are, rather than any external factors. This will enhance the confidence you have in listening to your own body and following those internal cues  enabling you to increase how often you eat when you are hungry not out of appetite and stop when you are satisfied not full.  Encouraged pt to continue to eat balanced meals inclusive of non starchy vegetables 2 times a day 7 days a week Encouraged pt to choose lean protein sources: limiting beef, pork, sausage, hotdogs, and lunch meat Encourage pt to choose healthy fats such as plant based limiting animal fats Encouraged pt to continue to drink a minium 64 fluid ounces with half being plain water to satisfy proper hydration    Goals Increase physical activity; increase intensity and add resistance exercises  Handouts Provided Include  Standard Prep Plan Food Advancement Guide  Learning Style & Readiness for Change Teaching method utilized: Visual & Auditory  Demonstrated degree of understanding via: Teach Back  Readiness Level: Action Barriers to learning/adherence to lifestyle change: nothing identified  RD's Notes for Next Visit Assess adherence to pt chosen goals  MONITORING & EVALUATION Dietary intake, weekly physical activity, body weight.  Next Steps Patient is to follow-up in 3 months for 6 month post-op follow-up/class.

## 2023-06-26 ENCOUNTER — Encounter: Payer: Self-pay | Admitting: Nurse Practitioner

## 2023-06-26 ENCOUNTER — Other Ambulatory Visit: Payer: Self-pay

## 2023-06-26 ENCOUNTER — Ambulatory Visit (INDEPENDENT_AMBULATORY_CARE_PROVIDER_SITE_OTHER): Payer: 59 | Admitting: Nurse Practitioner

## 2023-06-26 VITALS — BP 120/80 | HR 83 | Temp 98.1°F | Resp 16 | Ht 64.0 in | Wt 187.6 lb

## 2023-06-26 DIAGNOSIS — F411 Generalized anxiety disorder: Secondary | ICD-10-CM

## 2023-06-26 DIAGNOSIS — F331 Major depressive disorder, recurrent, moderate: Secondary | ICD-10-CM

## 2023-06-26 MED ORDER — BUSPIRONE HCL 5 MG PO TABS
5.0000 mg | ORAL_TABLET | Freq: Three times a day (TID) | ORAL | 0 refills | Status: DC | PRN
Start: 2023-06-26 — End: 2023-08-06
  Filled 2023-06-26: qty 90, 30d supply, fill #0

## 2023-06-26 NOTE — Assessment & Plan Note (Signed)
Worsening anxiety, will start buspar 5 mg TID, prn, she has follow up with Dr. Elna Breslow 07/05/2023. She also requests to have FMLA paperwork filled out.

## 2023-06-26 NOTE — Assessment & Plan Note (Signed)
Worsening anxiety, will start buspar 5 mg TID, prn, she has follow up with Dr. Elna Breslow 07/05/2023. She also requests to have FMLA paperwork filled out. g

## 2023-06-26 NOTE — Progress Notes (Signed)
BP 120/80   Pulse 83   Temp 98.1 F (36.7 C) (Oral)   Resp 16   Ht 5\' 4"  (1.626 m)   Wt 187 lb 9.6 oz (85.1 kg)   SpO2 99%   BMI 32.20 kg/m    Subjective:    Patient ID: Jessica Espinoza, female    DOB: Feb 06, 1986, 37 y.o.   MRN: [id number]  HPI: Jessica Espinoza is a 36 y.o. female  Chief Complaint  Patient presents with   Depression   Anxiety   Depression/anxiety/ADHD: she is currently being seen by psychiatry,Dr. Elna Breslow.  She was last seen on  02/11/2023. She is currently on zoloft 100 mg daily.  She is going to get genesight testing done with psychiatry.  She is also on  Vyvanse and Adderall extended release for ADHD. She says that she recently had a URI and did not take her medication. She says then she spiraled downward, she reports her depression and anxiety has gotten worse. She has not been going to work. She says she has been out of work since 06/13/2023. She says she needs to stay out of work until she gets her mental health under control. She did not bring her FMLA paper work in to fill out. She will drop it off later.  She has an appointment with Dr. Elna Breslow 07/05/2023. Medication zoloft 100 mg, has not been taking Abilify, psychiatry wanted her off the medication PHQ9 positive GAD positive     06/26/2023   10:21 AM 02/22/2023    8:50 AM 02/11/2023    3:59 PM 01/31/2023    1:24 PM 01/31/2023    1:21 PM  Depression screen PHQ 2/9  Decreased Interest 3 0 1 0 0  Down, Depressed, Hopeless 3 0 1 0 0  PHQ - 2 Score 6 0 2 0 0  Altered sleeping 3 0 3 0   Tired, decreased energy 3 0 2 0   Change in appetite 0 0 2 0   Feeling bad or failure about yourself  3 0 0 0   Trouble concentrating 3 0 3 0   Moving slowly or fidgety/restless 3 0 0 0   Suicidal thoughts 0 0 0 0   PHQ-9 Score 21 0 12 0   Difficult doing work/chores Somewhat difficult Not difficult at all Very difficult Not difficult at all        06/26/2023   10:22 AM 02/22/2023    8:50 AM 02/11/2023    4:00 PM  01/31/2023    1:24 PM  GAD 7 : Generalized Anxiety Score  Nervous, Anxious, on Edge 3 0 2 0  Control/stop worrying 3 0 1 0  Worry too much - different things 3 0 2 0  Trouble relaxing 3 0 1 0  Restless 3 0 1 0  Easily annoyed or irritable 3 0 3 0  Afraid - awful might happen 3 0 2 0  Total GAD 7 Score 21 0 12 0  Anxiety Difficulty Somewhat difficult Not difficult at all Very difficult Not difficult at all       Relevant past medical, surgical, family and social history reviewed and updated as indicated. Interim medical history since our last visit reviewed. Allergies and medications reviewed and updated.  Review of Systems  Constitutional: Negative for fever or weight change.  Respiratory: Negative for cough and shortness of breath.   Cardiovascular: Negative for chest pain or palpitations.  Gastrointestinal: Negative for abdominal pain, no bowel changes.  Musculoskeletal:  Negative for gait problem or joint swelling.  Skin: Negative for rash.  Neurological: Negative for dizziness or headache.  No other specific complaints in a complete review of systems (except as listed in HPI above).      Objective:    BP 120/80   Pulse 83   Temp 98.1 F (36.7 C) (Oral)   Resp 16   Ht 5\' 4"  (1.626 m)   Wt 187 lb 9.6 oz (85.1 kg)   SpO2 99%   BMI 32.20 kg/m   Wt Readings from Last 3 Encounters:  06/26/23 187 lb 9.6 oz (85.1 kg)  06/25/23 190 lb 4.8 oz (86.3 kg)  03/05/23 217 lb 9.6 oz (98.7 kg)    Physical Exam  Constitutional: Patient appears well-developed and well-nourished. Obese  No distress.  HEENT: head atraumatic, normocephalic, pupils equal and reactive to light, neck supple Cardiovascular: Normal rate, regular rhythm and normal heart sounds.  No murmur heard. No BLE edema. Pulmonary/Chest: Effort normal and breath sounds normal. No respiratory distress. Abdominal: Soft.  There is no tenderness. Psychiatric: Patient has a normal mood and affect. behavior is normal.  Judgment and thought content normal.  Results for orders placed or performed during the hospital encounter of 02/18/23  CBC  Result Value Ref Range   WBC 13.3 (H) 4.0 - 10.5 K/uL   RBC 4.48 3.87 - 5.11 MIL/uL   Hemoglobin 11.9 (L) 12.0 - 15.0 g/dL   HCT 30.8 65.7 - 84.6 %   MCV 81.0 80.0 - 100.0 fL   MCH 26.6 26.0 - 34.0 pg   MCHC 32.8 30.0 - 36.0 g/dL   RDW 96.2 95.2 - 84.1 %   Platelets 272 150 - 400 K/uL   nRBC 0.0 0.0 - 0.2 %  Magnesium  Result Value Ref Range   Magnesium 1.9 1.7 - 2.4 mg/dL  Comprehensive metabolic panel  Result Value Ref Range   Sodium 135 135 - 145 mmol/L   Potassium 4.4 3.5 - 5.1 mmol/L   Chloride 100 98 - 111 mmol/L   CO2 26 22 - 32 mmol/L   Glucose, Bld 123 (H) 70 - 99 mg/dL   BUN 8 6 - 20 mg/dL   Creatinine, Ser 3.24 0.44 - 1.00 mg/dL   Calcium 8.7 (L) 8.9 - 10.3 mg/dL   Total Protein 7.0 6.5 - 8.1 g/dL   Albumin 3.8 3.5 - 5.0 g/dL   AST 24 15 - 41 U/L   ALT 27 0 - 44 U/L   Alkaline Phosphatase 75 38 - 126 U/L   Total Bilirubin 0.6 0.3 - 1.2 mg/dL   GFR, Estimated >40 >10 mL/min   Anion gap 9 5 - 15  Pregnancy, urine POC  Result Value Ref Range   Preg Test, Ur NEGATIVE NEGATIVE  Surgical pathology  Result Value Ref Range   SURGICAL PATHOLOGY      SURGICAL PATHOLOGY CASE: [phone] PATIENT: Jessica Espinoza Surgical Pathology Report     Clinical History: Morbid obesity (crm)     FINAL MICROSCOPIC DIAGNOSIS:  A. STOMACH, GREATER CURVATURE, SLEEVE RESECTION: - Gross diagnosis only: Portion of unremarkable stomach.  GROSS DESCRIPTION:  Specimen: Received fresh is a portion of stomach with a staple line along 1 aspect Size: 19 x 4.5 x 3.0 cm Serosa: Pink-tan, smooth, and intact Mucosa/Wall: Pink-tan and rugated without distinct lesions. The specimen is for gross examination only and no sections are submitted to histology. (KW, 02/18/2023)   Final Diagnosis performed by Orene Desanctis DO.   Electronically  signed 02/19/2023 Technical and / or Professional components performed at [facility], [address] [place], [place] [address].  Immunohistochemistry Technical component (if applicable) was performed at [facility]. [address], [place], [place] [address].   IMMUNOHISTOCHEMISTRY DISCLAIMER (if applicable): Some of these immunohistochemical stains may have been developed and the performance characteristics determine by [facility]. Some may not have been cleared or approved by the U.S. Food and Drug Administration. The FDA has determined that such clearance or approval is not necessary. This test is used for clinical purposes. It should not be regarded as investigational or for research. This laboratory is certified under the Clinical Laboratory Improvement Amendments of 1988 (CLIA-88) as qualified to perform high complexity clinical laboratory testing.  The controls stained appropriately.       Assessment & Plan:   Problem List Items Addressed This Visit       Other   GAD (generalized anxiety disorder) - Primary    Worsening anxiety, will start buspar 5 mg TID, prn, she has follow up with Dr. Elna Breslow 07/05/2023. She also requests to have FMLA paperwork filled out. g      Relevant Medications   busPIRone (BUSPAR) 5 MG tablet   Moderate episode of recurrent major depressive disorder (HCC)    Worsening anxiety, will start buspar 5 mg TID, prn, she has follow up with Dr. Elna Breslow 07/05/2023. She also requests to have FMLA paperwork filled out.       Relevant Medications   busPIRone (BUSPAR) 5 MG tablet     Follow up plan: Return if symptoms worsen or fail to improve.

## 2023-07-02 ENCOUNTER — Encounter: Payer: Self-pay | Admitting: Psychiatry

## 2023-07-02 ENCOUNTER — Other Ambulatory Visit: Payer: Self-pay

## 2023-07-02 ENCOUNTER — Other Ambulatory Visit (HOSPITAL_BASED_OUTPATIENT_CLINIC_OR_DEPARTMENT_OTHER): Payer: Self-pay

## 2023-07-02 ENCOUNTER — Ambulatory Visit (INDEPENDENT_AMBULATORY_CARE_PROVIDER_SITE_OTHER): Payer: 59 | Admitting: Psychiatry

## 2023-07-02 ENCOUNTER — Telehealth (HOSPITAL_COMMUNITY): Payer: Self-pay | Admitting: Psychiatry

## 2023-07-02 VITALS — BP 109/75 | HR 62 | Temp 97.5°F | Ht 64.0 in | Wt 183.0 lb

## 2023-07-02 DIAGNOSIS — R41840 Attention and concentration deficit: Secondary | ICD-10-CM

## 2023-07-02 DIAGNOSIS — F411 Generalized anxiety disorder: Secondary | ICD-10-CM

## 2023-07-02 DIAGNOSIS — F331 Major depressive disorder, recurrent, moderate: Secondary | ICD-10-CM

## 2023-07-02 MED ORDER — SERTRALINE HCL 100 MG PO TABS
ORAL_TABLET | ORAL | 0 refills | Status: DC
  Filled 2023-07-02: qty 51, 30d supply, fill #0

## 2023-07-02 MED ORDER — HYDROXYZINE HCL 25 MG PO TABS
12.5000 mg | ORAL_TABLET | Freq: Two times a day (BID) | ORAL | 1 refills | Status: AC | PRN
  Filled 2023-07-02: qty 60, 30d supply, fill #0

## 2023-07-02 NOTE — Patient Instructions (Signed)
Serotonin Syndrome Serotonin is a chemical that helps to control several functions in the body. This chemical is also called a neurotransmitter. It controls: Brain and nerve cell function. Mood and emotions. Memory. Eating. Sleeping. Sexual activity. Stress response. Having too much serotonin in your body can cause serotonin syndrome. This condition can be harmful to your brain and nerve cells. This can be a life-threatening condition. What are the causes? This condition may be caused by taking medicines or drugs that increase the level of serotonin in your body, such as: Antidepressant medicines. Migraine medicines. Certain pain medicines. Certain drugs, including ecstasy, LSD, cocaine, and amphetamines. Over-the-counter cough or cold medicines that contain dextromethorphan. Certain herbal supplements, including St. John's wort, ginseng, and nutmeg. This condition usually occurs when you take these medicines or drugs together, but it can also happen with a high dose of a single medicine or drug. What increases the risk? You are more likely to develop this condition if: You just started taking a medicine or drug that increases the level of serotonin in the body. You recently increased the dose of a medicine or drug that increases the level of serotonin in the body. You take more than one medicine or drug that increases the level of serotonin in the body. What are the signs or symptoms? Symptoms of this condition usually start within several hours of taking a medicine or drug. Symptoms may be mild or severe. Mild symptoms include: Sweating. Restlessness or agitation. Muscle twitching or stiffness. Rapid heart rate. Nausea, vomiting, or diarrhea. Shivering or goose bumps. Confusion. Severe symptoms include: Irregular heartbeat. Seizures. Loss of consciousness. High fever. How is this diagnosed? This condition may be diagnosed based on: Your medical history. A physical  exam. Your prior use of drugs and medicines. Blood or urine tests. These may be used to rule out other causes of your symptoms. How is this treated? The treatment for this condition depends on the severity of your symptoms. For mild cases, stopping the medicine or drug that caused your condition is usually all that is needed. For moderate to severe cases, treatment in a hospital may be needed to prevent or treat life-threatening symptoms. Treatment may include: Medicines to control your symptoms. IV fluids. Actions to support your breathing. Treatments to control your body temperature. Follow these instructions at home: Medicines  Take over-the-counter and prescription medicines only as told by your health care provider. Check with your health care provider before you start taking any new prescriptions, over-the-counter medicines, herbs, or supplements. Do not combine any medicines that can cause this condition. Lifestyle  Maintain a healthy lifestyle. Eat a healthy diet that includes plenty of vegetables, fruits, whole grains, low-fat dairy products, and lean protein. Do not eat a lot of foods that are high in fat, added sugars, or salt. Get the right amount and quality of sleep. Most adults need 7-9 hours of sleep each night. Make time to exercise, even if it is only for short periods of time. Most adults should exercise for at least 150 minutes each week. Do not drink alcohol. Do not use illegal drugs. Do not take medicines for reasons other than they are prescribed. General instructions Do not use any products that contain nicotine or tobacco. These products include cigarettes, chewing tobacco, and vaping devices, such as e-cigarettes. If you need help quitting, ask your health care provider. Contact a health care provider if: Your symptoms do not improve or they get worse. Get help right away if: You have worsening  confusion, severe headache, chest pain, high fever, seizures, or  loss of consciousness. You experience serious side effects of medicine, such as swelling of your face, lips, tongue, or throat. These symptoms may be an emergency. Get help right away. Call 911. Do not wait to see if the symptoms will go away. Do not drive yourself to the hospital. Also, get help right away if: You have serious thoughts about hurting yourself or others. Take one of these steps if you feel like you may hurt yourself or others, or have thoughts about taking your own life: Go to your nearest emergency room. Call 911. Call the National Suicide Prevention Lifeline at (347)397-9196 or 988. This is open 24 hours a day. Text the Crisis Text Line at 713-478-9599. Summary Serotonin is a chemical that helps to control several functions in the body. High levels of serotonin in the body can cause serotonin syndrome, which can be life-threatening. This condition may be caused by taking medicines or drugs that increase the level of serotonin in your body. Treatment depends on the severity of your symptoms. For mild cases, stopping the medicine or drug that caused your condition is usually all that is needed. Check with your health care provider before you start taking any new prescriptions, over-the-counter medicines, herbs, or supplements. This information is not intended to replace advice given to you by your health care provider. Make sure you discuss any questions you have with your health care provider. Document Revised: 12/07/2021 Document Reviewed: 12/07/2021 Elsevier Patient Education  2024 Else[employer] Inc. Hydroxyzine Capsules or Tablets What is this medication? HYDROXYZINE (hye DROX i zeen) treats the symptoms of allergies and allergic reactions. It may also be used to treat anxiety or cause drowsiness before a procedure. It works by blocking histamine, a substance released by the body during an allergic reaction. It belongs to a group of medications called antihistamines. This medicine  may be used for other purposes; ask your health care provider or pharmacist if you have questions. COMMON BRAND NAME(S): ANX, Atarax, Rezine, Vistaril What should I tell my care team before I take this medication? They need to know if you have any of these conditions: Glaucoma Heart disease Irregular heartbeat or rhythm Kidney disease Liver disease Lung or breathing disease, such as asthma Stomach or intestine problems Thyroid disease Trouble passing urine An unusual or allergic reaction to hydroxyzine, other medications, foods, dyes or preservatives Pregnant or trying to get pregnant Breastfeeding How should I use this medication? Take this medication by mouth with a full glass of water. Take it as directed on the prescription label at the same time every day. You can take it with or without food. If it upsets your stomach, take it with food. Talk to your care team about the use of this medication in children. While it may be prescribed for children as young as 6 years for selected conditions, precautions do apply. People 65 years and older may have a stronger reaction and need a smaller dose. Overdosage: If you think you have taken too much of this medicine contact a poison control center or emergency room at once. NOTE: This medicine is only for you. Do not share this medicine with others. What if I miss a dose? If you miss a dose, take it as soon as you can. If it is almost time for your next dose, take only that dose. Do not take double or extra doses. What may interact with this medication? Do not take this medication with  any of the following: Cisapride Dronedarone Pimozide Thioridazine This medication may also interact with the following: Alcohol Antihistamines for allergy, cough, and cold Atropine Barbiturate medications for sleep or seizures, such as phenobarbital Certain antibiotics, such as erythromycin or clarithromycin Certain medications for anxiety or  sleep Certain medications for bladder problems, such as oxybutynin or tolterodine Certain medications for irregular heartbeat Certain medications for mental health conditions Certain medications for Parkinson disease, such as benztropine, trihexyphenidyl Certain medications for seizures, such as phenobarbital or primidone Certain medications for stomach problems, such as dicyclomine or hyoscyamine Certain medications for travel sickness, such as scopolamine Ipratropium Opioid medications for pain Other medications that cause heart rhythm changes, such as dofetilide This list may not describe all possible interactions. Give your health care provider a list of all the medicines, herbs, non-prescription drugs, or dietary supplements you use. Also tell them if you smoke, drink alcohol, or use illegal drugs. Some items may interact with your medicine. What should I watch for while using this medication? Visit your care team for regular checks on your progress. Tell your care team if your symptoms do not start to get better or if they get worse. This medication may affect your coordination, reaction time, or judgment. Do not drive or operate machinery until you know how this medication affects you. Sit up or stand slowly to reduce the risk of dizzy or fainting spells. Drinking alcohol with this medication can increase the risk of these side effects. Your mouth may get dry. Chewing sugarless gum or sucking hard candy and drinking plenty of water may help. Contact your care team if the problem does not go away or is severe. This medication may cause dry eyes and blurred vision. If you wear contact lenses, you may feel some discomfort. Lubricating eye drops may help. See your care team if the problem does not go away or is severe. If you are receiving skin tests for allergies, tell your care team you are taking this medication. What side effects may I notice from receiving this medication? Side effects  that you should report to your care team as soon as possible: Allergic reactions--skin rash, itching, hives, swelling of the face, lips, tongue, or throat Heart rhythm changes--fast or irregular heartbeat, dizziness, feeling faint or lightheaded, chest pain, trouble breathing Side effects that usually do not require medical attention (report to your care team if they continue or are bothersome): Confusion Drowsiness Dry mouth Hallucinations Headache This list may not describe all possible side effects. Call your doctor for medical advice about side effects. You may report side effects to FDA at 1-800-FDA-1088. Where should I keep my medication? Keep out of the reach of children and pets. Store at room temperature between 15 and 30 degrees C (59 and 86 degrees F). Keep container tightly closed. Throw away any unused medication after the expiration date. NOTE: This sheet is a summary. It may not cover all possible information. If you have questions about this medicine, talk to your doctor, pharmacist, or health care provider.  2024 Elsevier/Gold Standard (2022-04-27 00:00:00)

## 2023-07-02 NOTE — Progress Notes (Signed)
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  Patient today reports she has been struggling with mood symptoms since the past several weeks.  Patient describes anxiety attacks.  She reports she feels overwhelmed, nervous and has these episodes of shakiness, racing heart rate, mind racing and sweating.  Patient reports that comes out of the blue.  She is unable to elaborate further.  She does not know how long it lasts.  She reports she does not feel like leaving her home when that happens.  She reports she wants to stay with her children and has this feeling of something bad going to happen when she has these attacks.  She reports she has these attacks currently every single day.  She reports her primary care provider recently prescribed BuSpar however she has not picked it up from the pharmacy yet.  She is currently on FMLA from work since beginning of September.  She reports she is planning to talk to her school about dropping out this semester or changing her course.  She currently does feel so overwhelmed.  Patient continues to have episodes of irritability, sadness, feeling hopeless.  Patient also reports decreased appetite, sleep issues.  Patient denies any suicidality, homicidality or perceptual disturbances.  Patient reports her husband is supportive.   Patient continues to take Vyvanse during the day for ADHD symptoms and also  has Adderall extended release prescribed as needed.  Patient denies any other concerns today.  Visit Diagnosis:    ICD-10-CM   1. GAD (generalized anxiety disorder)  F41.1 hydrOXYzine (ATARAX) 25 MG tablet    2. MDD (major depressive disorder), recurrent episode, moderate (HCC)  F33.1     3. Attention and concentration deficit  R41.840 sertraline (ZOLOFT) 100 MG tablet      Past Psychiatric History: I have reviewed past psychiatric history from progress note on 02/11/2023.  Past trials of Ritalin-made her like a zombie, Zoloft, Wellbutrin, Vyvanse.  Past Medical History:  Past Medical History:  Diagnosis Date   ADHD    Anxiety    BMI 37.0-37.9, adult    Depression    Headache    sensory overload causes Migraines   Hypertension    Obesity     Past Surgical History:  Procedure Laterality Date   HYSTEROSCOPY WITH D & C N/A 04/05/2022   Procedure: DILATATION AND CURETTAGE /HYSTEROSCOPY, ENDOMETRIAL POLYPECTOMY;  Surgeon: Conard Novak, MD;  Location: [facility];  Service: Gynecology;  Laterality: N/A;   LAPAROSCOPIC GASTRIC SLEEVE RESECTION N/A 02/18/2023   Procedure: LAPAROSCOPIC GASTRIC SLEEVE RESECTION;  Surgeon: Berna Bue, MD;  Location: [facility];  Service: General;  Laterality: N/A;   NO PAST SURGERIES     UPPER GI ENDOSCOPY N/A 02/18/2023   Procedure: UPPER GI ENDOSCOPY;  Surgeon: Berna Bue, MD;  Location: [facility];  Service: General;  Laterality: N/A;    Family Psychiatric History: Reviewed family psychiatric history from progress note on  02/11/2023.  Family History:  Family History  Problem Relation Age of Onset   Bipolar disorder Sister    Bipolar disorder Brother    Autism spectrum disorder Son    CAD Neg Hx    Hypertension Neg Hx    Diabetes Neg Hx     Social History: Reviewed social history from progress note on 02/11/2023. 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  Tobacco Use   Smoking status: Former    Current packs/day: 0.00    Types: Cigarettes    Quit date: 05/07/2008    Years since quitting: 15.1   Smokeless tobacco: Never  Vaping Use   Vaping status: Never Used  Substance and Sexual Activity   Alcohol use: No   Drug use: Not Currently   Sexual activity: Yes    Birth control/protection: Condom  Other Topics Concern   Not on file  Social History Narrative   Not on file   Social Determinants of Health   Financial Resource Strain: Low Risk  (02/22/2023)   Overall Financial Resource Strain (CARDIA)    Difficulty of Paying Living Expenses: Not hard at all  Food Insecurity: No Food Insecurity (02/22/2023)   Hunger Vital Sign    Worried About Running Out of Food in the Last Year: Never true    Ran Out of Food in the Last Year: Never true  Transportation Needs: No Transportation Needs (02/22/2023)   PRAPARE - Administrator, Civil Service (Medical): No    Lack of Transportation (Non-Medical): No  Physical Activity: Unknown (02/22/2023)   Exercise Vital Sign    Days of Exercise per Week: 0 days    Minutes of Exercise per Session: Not on file  Stress: No Stress Concern Present (02/22/2023)   [employer] of Occupational Health - Occupational Stress Questionnaire    Feeling of Stress : Only a little  Social Connections: Unknown (02/22/2023)   Social Connection and Isolation Panel [NHANES]    Frequency of Communication with Friends and Family: More than three times a week    Frequency of Social Gatherings with Friends and Family: Once a week    Attends Religious Services: Patient declined    Database administrator or Organizations: No    Attends Engineer, structural: Not on file    Marital Status: Married    Allergies:   Allergies  Allergen Reactions   Mustard Oil [Allyl Isothiocyanate] Swelling and Rash    Metabolic Disorder Labs: Lab Results  Component Value Date   HGBA1C 5.5 10/18/2022   MPG 111 10/18/2022   MPG 105.41 07/18/2019   No results found for: "PROLACTIN" Lab Results  Component Value Date   CHOL 157 10/18/2022   TRIG 170 (H) 10/18/2022   HDL 48 (L) 10/18/2022   CHOLHDL 3.3 10/18/2022   LDLCALC 82 10/18/2022   No results found for: "TSH"  Therapeutic Level Labs: No results found for: "LITHIUM" No results found for: "VALPROATE" No results found for: "CBMZ"  Current Medications: Current Outpatient Medications  Medication Sig Dispense Refill   acetaminophen (TYLENOL) 500 MG tablet Take 1,000 mg by mouth as needed for moderate pain.     albuterol (VENTOLIN HFA) 108 (90 Base) MCG/ACT  inhaler Inhale 1-2 puffs into the lungs every 6 (six) hours as needed. (Patient taking differently: Inhale 2 puffs into the lungs every 6 (six) hours as needed for wheezing or shortness of breath.) 6.7 g 0   aluminum chloride (DRYSOL) 20 % external solution Apply 1 Application topically every 14 (fourteen) days. 60 mL 3   amphetamine-dextroamphetamine (ADDERALL) 20 MG tablet Take 0.5-1 tablets (10-20 mg total) by mouth daily in the afternoon. 30 tablet 0   cetirizine (ZYRTEC) 10 MG tablet Take 10 mg by mouth daily.     Cholecalciferol 50 MCG (2000 UT) TABS Take 2,000 Units by mouth daily.     dicyclomine (BENTYL) 20 MG tablet Take 1 tablet (20 mg total) by mouth every 6 (six) hours as needed. (Patient taking differently: Take 20 mg by mouth as needed for spasms.) 30 tablet 1   EPINEPHrine 0.3 mg/0.3 mL IJ SOAJ injection Inject 0.3 mg into the muscle as needed for anaphylaxis.     fluticasone (FLONASE) 50 MCG/ACT nasal spray Place 1 spray into the nose as needed for rhinitis or allergies.     hydrOXYzine (ATARAX) 25 MG tablet Take 0.5-1 tablets (12.5-25 mg total) by mouth 2 (two) times daily as needed for  anxiety. 60 tablet 1   lisdexamfetamine (VYVANSE) 60 MG capsule Take 1 capsule (60 mg total) by mouth daily. 30 capsule 0   lisdexamfetamine (VYVANSE) 60 MG capsule Take 1 capsule (60 mg total) by mouth every morning. 30 capsule 0   Multiple Vitamin (MULTI-VITAMIN) tablet Take 1 tablet by mouth daily.     olmesartan (BENICAR) 40 MG tablet Take 1 tablet (40 mg total) by mouth daily. 90 tablet 0   ondansetron (ZOFRAN-ODT) 4 MG disintegrating tablet Take 1 tablet (4 mg total) by mouth every 6 (six) hours as needed for nausea or vomiting. 20 tablet 0   pantoprazole (PROTONIX) 40 MG tablet Take 1 tablet (40 mg total) by mouth daily. Take this medication daily, regardless of reflux symptoms 90 tablet 1   Probiotic Product (PROBIOTIC PO) Take 1 capsule by mouth daily.     busPIRone (BUSPAR) 5 MG tablet Take 1 tablet (5 mg total) by mouth 3 (three) times daily as needed. (Patient not taking: Reported on 07/02/2023) 90 tablet 0   cyclobenzaprine (FLEXERIL) 10 MG tablet Take 1 tablet (10 mg total) by mouth 3 (three) times daily as needed for muscle spasms. (Patient not taking: Reported on 07/02/2023) 30 tablet 0   sertraline (ZOLOFT) 100 MG tablet Take 1.5 tablets (150 mg total) by mouth in the morning for 21 days, THEN 2 tablets (200 mg total) in the morning for 10 days. 51 tablet 0   No current facility-administered medications for this visit.     Musculoskeletal: Strength & Muscle Tone: within normal limits Gait & Station: normal Patient leans: N/A  Psychiatric Specialty Exam: Review of Systems  Psychiatric/Behavioral:  Positive for decreased concentration, dysphoric mood and sleep disturbance. The patient is nervous/anxious.     Blood pressure 109/75, pulse 62, temperature (!) 97.5 F (36.4 C), temperature source Skin, height 5\' 4"  (1.626 m), weight 183 lb (83 kg).Body mass index is 31.41 kg/m.  General Appearance: Casual  Eye Contact:  Fair  Speech:  Clear and Coherent  Volume:  Normal   Mood:  Anxious and Depressed  Affect:  Depressed  Thought Process:  Goal Directed and Descriptions of Associations: Intact  Orientation:  Full (Time, Place, and Person)  Thought Content: Logical   Suicidal Thoughts:  No  Homicidal Thoughts:  No  Memory:  Immediate;   Fair Recent;   Fair Remote;   Fair  Judgement:  Fair  Insight:  Fair  Psychomotor Activity:  Normal  Concentration:  Concentration: Fair and Attention Span: Fair  Recall:  [employer] of Knowledge: Fair  Language: Fair  Akathisia:  No  Handed:  Right  AIMS (if indicated): not done  Assets:  Communication Skills Desire for Improvement Social Support Talents/Skills Transportation  ADL's:  Intact  Cognition: WNL  Sleep:  Poor   Screenings: GAD-7    Garment/textile technologist Visit from 07/02/2023 in [facility] Office Visit from 06/26/2023 in [facility] Office Visit from 02/22/2023 in [facility] Office Visit from 02/11/2023 in [facility] Office Visit from 01/31/2023 in [facility]  Total GAD-7 Score 21 21 0 12 0      PHQ2-9    Flowsheet Row Office Visit from 07/02/2023 in [facility] Office Visit from 06/26/2023 in [facility] Office Visit from 02/22/2023 in [facility] Office Visit from 02/11/2023 in [facility] Office Visit from 01/31/2023 in [facility]  PHQ-2 Total Score 6 6 0 2 0  PHQ-9 Total Score 15 21 0 12 0      Flowsheet Row Office Visit from 07/02/2023 in [facility] Admission (Discharged) from 02/18/2023 in [facility] Office Visit from 02/11/2023 in [facility]  C-SSRS RISK CATEGORY No Risk No Risk  No Risk        Assessment and Plan: Sierria A Maldonado is a 37 year old Caucasian female who has a history of mood lability, anxiety, history of gastric sleeve surgery, currently with worsening mood symptoms, will benefit from medication readjustment.  Patient will also benefit from psychotherapy sessions as well as possible referral to intensive outpatient program, plan as noted below.  Plan GAD-unstable Increase to sertraline 150 mg p.o. daily for 21 days and then to 200 mg p.o. daily after that Start hydroxyzine 12.5-25 mg p.o. daily as needed for severe anxiety attacks  MDD recurrent moderate-unstable Increase sertraline as noted above. Will refer to IOP, I have sent communication to Ms. Jeri Modena in [place]. Patient advised to establish care with a therapist for individual therapy.  Provided resources Patient prescribed BuSpar by primary care provider which she has not picked up from pharmacy.  Patient could hold off on starting this medication right now since sertraline is being readjusted..  Attention and concentration deficit-history of ADHD-currently under the care of [place] attention specialist Patient advised to discuss with her provider regarding her worsening anxiety symptoms.   Psychostimulants could also make her anxiety worse. Patient is currently on Vyvanse 60 mg and Adderall 20 mg which she takes only as needed. Reviewed [place] PMP AWARxE    Collaboration of Care: Collaboration of Care: Other I have referred patient for intensive outpatient program, also encourage patient to establish care with a therapist.  Patient/Guardian was advised Release of Information must be obtained prior to any record release in order to collaborate their care with an outside provider. Patient/Guardian was advised if they have not already done so to contact the registration department to sign all necessary forms in order for [place] to release information regarding their care.   Consent:  Patient/Guardian gives verbal  consent for treatment and assignment of benefits for services provided during this visit. Patient/Guardian expressed understanding and agreed to proceed.  Follow-up in clinic in 4 to 5 weeks or sooner if needed.  This note was generated in part or whole with voice recognition software. Voice recognition is usually quite accurate but there are transcription errors that can and very often do occur. I apologize for any typographical errors that were not detected and corrected.     Jomarie Longs, MD 07/02/2023, 2:26 PM

## 2023-07-02 NOTE — Telephone Encounter (Signed)
D:  Dr. Elna Breslow referred pt to virtual [facility]-IOP.  A:  Placed call to orient pt, but there was no answer. Case mgr left vm for pt to call her back.  Inform Dr. Elna Breslow.

## 2023-08-06 ENCOUNTER — Other Ambulatory Visit: Payer: Self-pay

## 2023-08-06 ENCOUNTER — Ambulatory Visit (INDEPENDENT_AMBULATORY_CARE_PROVIDER_SITE_OTHER): Payer: 59 | Admitting: Psychiatry

## 2023-08-06 ENCOUNTER — Encounter: Payer: Self-pay | Admitting: Psychiatry

## 2023-08-06 VITALS — BP 127/84 | HR 56 | Temp 97.9°F | Ht 64.0 in | Wt 180.4 lb

## 2023-08-06 DIAGNOSIS — R41840 Attention and concentration deficit: Secondary | ICD-10-CM

## 2023-08-06 DIAGNOSIS — F3341 Major depressive disorder, recurrent, in partial remission: Secondary | ICD-10-CM | POA: Diagnosis not present

## 2023-08-06 DIAGNOSIS — F411 Generalized anxiety disorder: Secondary | ICD-10-CM | POA: Diagnosis not present

## 2023-08-06 MED ORDER — SERTRALINE HCL 100 MG PO TABS
200.0000 mg | ORAL_TABLET | Freq: Every day | ORAL | 1 refills | Status: AC
Start: 2023-08-06 — End: ?
  Filled 2023-08-06 – 2023-10-26 (×2): qty 60, 30d supply, fill #0
  Filled 2023-12-23: qty 60, 30d supply, fill #1
  Filled 2024-01-23: qty 60, 30d supply, fill #2
  Filled 2024-02-20: qty 60, 30d supply, fill #3
  Filled 2024-04-08: qty 60, 30d supply, fill #4

## 2023-08-06 NOTE — Progress Notes (Unsigned)
BH MD OP Progress Note  08/06/2023 11:44 AM Jessica Espinoza  MRN:  [id number]  Chief Complaint:  Chief Complaint  Patient presents with   Follow-up   Depression   Anxiety   Medication Refill   HPI: Jessica Espinoza is a 37 year old Caucasian female, married, currently employed part-time as a Ambulance person at [employer], Consulting civil engineer at [employer], lives in [place], has a history of GAD, MDD, attention and concentration problem-diagnosed with ADHD per [place] [facility], history of hypertension, morbid obesity, status post gastric sleeve surgery was evaluated in office today for a follow-up appointment.  Patient today reports she is currently on the Zoloft 200 mg daily.  She reports since taking the higher dosage she has noticed good improvement with regards to her mood symptoms.  She does not feel overwhelmed or anxious as she used to before.  Denies any anxiety attacks.  She is tolerating the medication dosage well.  She stopped taking the BuSpar since she felt she did not want to be overloaded with a lot of medications.  She is doing well without the BuSpar.  She continues to be under the care of [place] attention specialist and is currently being prescribed Vyvanse as well as Adderall extended release.  She reports she has been taking the Vyvanse regularly and the Adderall she only uses it as needed.  That does help with her attention and focus.  Patient reports sleep is overall good at night.  Denies any suicidality, homicidality or perceptual disturbances.  Patient today reports she is currently employed part-time at [employer], currently does 12-hour shifts once a week on Saturday or Sunday.  She reports since she was not functioning well at work she is currently on a list where she can be terminated if she shows up late or misses work again.  She reports the current schedule is working well for her.  She is also back at school and is currently taking 4 classes.  She  reports she is managing it okay.  She is planning to take the next semester off and then planning to go back in the fall.  She believes this break will help her with her mental wellbeing.  She has been unable to find a therapist however is motivated to establish care.  Patient reports she is aware that she did receive a call from the intensive outpatient program at [facility] health to which she was referred last visit on 07/02/2023.  Patient agrees to give them a call back as needed.  Patient denies any other concerns today.  Visit Diagnosis:    ICD-10-CM   1. GAD (generalized anxiety disorder)  F41.1 sertraline (ZOLOFT) 100 MG tablet    2. Recurrent major depressive disorder, in partial remission (HCC)  F33.41 sertraline (ZOLOFT) 100 MG tablet    3. Attention and concentration deficit  R41.840       Past Psychiatric History: I have reviewed past psychiatric history from progress note on 02/11/2023.  Past trials of Ritalin-made her like a zombie, Zoloft, Wellbutrin, BuSpar Vyvanse.  Past Medical History:  Past Medical History:  Diagnosis Date   ADHD    Anxiety    BMI 37.0-37.9, adult    Depression    Headache    sensory overload causes Migraines   Hypertension    Obesity     Past Surgical History:  Procedure Laterality Date   HYSTEROSCOPY WITH D & C N/A 04/05/2022   Procedure: DILATATION AND CURETTAGE /HYSTEROSCOPY, ENDOMETRIAL POLYPECTOMY;  Surgeon:  Conard Novak, MD;  Location: [facility] ORS;  Service: Gynecology;  Laterality: N/A;   LAPAROSCOPIC GASTRIC SLEEVE RESECTION N/A 02/18/2023   Procedure: LAPAROSCOPIC GASTRIC SLEEVE RESECTION;  Surgeon: Berna Bue, MD;  Location: [facility];  Service: General;  Laterality: N/A;   NO PAST SURGERIES     UPPER GI ENDOSCOPY N/A 02/18/2023   Procedure: UPPER GI ENDOSCOPY;  Surgeon: Berna Bue, MD;  Location: [facility];  Service: General;  Laterality: N/A;    Family Psychiatric History: I have reviewed family psychiatric history from  progress note on 02/11/2023.  Family History:  Family History  Problem Relation Age of Onset   Bipolar disorder Sister    Bipolar disorder Brother    Autism spectrum disorder Son    CAD Neg Hx    Hypertension Neg Hx    Diabetes Neg Hx     Social History: I have reviewed social history from progress note on 02/11/2023. 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  Tobacco Use   Smoking status: Former    Current packs/day: 0.00    Types: Cigarettes    Quit date: 05/07/2008    Years since quitting: 15.2   Smokeless tobacco: Never  Vaping Use   Vaping status: Never Used  Substance and Sexual Activity   Alcohol use: No   Drug use: Not Currently   Sexual activity: Yes    Birth control/protection: Condom  Other Topics Concern   Not on file  Social History Narrative   Not on file   Social Determinants of Health   Financial Resource Strain: Low Risk  (02/22/2023)   Overall Financial Resource Strain (CARDIA)    Difficulty of Paying Living Expenses: Not hard at all  Food Insecurity: No Food Insecurity (02/22/2023)   Hunger Vital Sign    Worried About Running Out of Food in the Last Year: Never true    Ran Out of Food in the Last Year: Never true  Transportation Needs: No Transportation Needs (02/22/2023)   PRAPARE - Administrator, Civil Service (Medical): No    Lack of Transportation (Non-Medical): No  Physical Activity: Unknown (02/22/2023)   Exercise Vital Sign    Days of Exercise per Week: 0 days    Minutes of Exercise per Session: Not on file  Stress: No Stress Concern Present (02/22/2023)   [employer] of Occupational Health - Occupational Stress Questionnaire    Feeling of Stress : Only a  little  Social Connections: Unknown (02/22/2023)   Social Connection and Isolation Panel [NHANES]    Frequency of Communication with Friends and Family: More than three times a week    Frequency of Social Gatherings with Friends and Family: Once a week    Attends Religious Services: Patient declined    Database administrator or Organizations: No    Attends Engineer, structural: Not on file    Marital Status: Married    Allergies:  Allergies  Allergen Reactions   Mustard Oil [Allyl Isothiocyanate] Swelling and Rash    Metabolic Disorder Labs: Lab Results  Component Value Date   HGBA1C 5.5 10/18/2022   MPG 111 10/18/2022   MPG 105.41 07/18/2019   No results found for: "  PROLACTIN" Lab Results  Component Value Date   CHOL 157 10/18/2022   TRIG 170 (H) 10/18/2022   HDL 48 (L) 10/18/2022   CHOLHDL 3.3 10/18/2022   LDLCALC 82 10/18/2022   No results found for: "TSH"  Therapeutic Level Labs: No results found for: "LITHIUM" No results found for: "VALPROATE" No results found for: "CBMZ"  Current Medications: Current Outpatient Medications  Medication Sig Dispense Refill   acetaminophen (TYLENOL) 500 MG tablet Take 1,000 mg by mouth as needed for moderate pain.     albuterol (VENTOLIN HFA) 108 (90 Base) MCG/ACT inhaler Inhale 1-2 puffs into the lungs every 6 (six) hours as needed. (Patient taking differently: Inhale 2 puffs into the lungs every 6 (six) hours as needed for wheezing or shortness of breath.) 6.7 g 0   aluminum chloride (DRYSOL) 20 % external solution Apply 1 Application topically every 14 (fourteen) days. 60 mL 3   amphetamine-dextroamphetamine (ADDERALL) 20 MG tablet Take 0.5-1 tablets (10-20 mg total) by mouth daily in the afternoon. 30 tablet 0   cetirizine (ZYRTEC) 10 MG tablet Take 10 mg by mouth daily.     Cholecalciferol 50 MCG (2000 UT) TABS Take 2,000 Units by mouth daily.     cyclobenzaprine (FLEXERIL) 10 MG tablet Take 1 tablet (10 mg total) by  mouth 3 (three) times daily as needed for muscle spasms. 30 tablet 0   dicyclomine (BENTYL) 20 MG tablet Take 1 tablet (20 mg total) by mouth every 6 (six) hours as needed. (Patient taking differently: Take 20 mg by mouth as needed for spasms.) 30 tablet 1   EPINEPHrine 0.3 mg/0.3 mL IJ SOAJ injection Inject 0.3 mg into the muscle as needed for anaphylaxis.     hydrOXYzine (ATARAX) 25 MG tablet Take 0.5-1 tablets (12.5-25 mg total) by mouth 2 (two) times daily as needed for anxiety. 60 tablet 1   lisdexamfetamine (VYVANSE) 60 MG capsule Take 1 capsule (60 mg total) by mouth daily. 30 capsule 0   lisdexamfetamine (VYVANSE) 60 MG capsule Take 1 capsule (60 mg total) by mouth every morning. 30 capsule 0   Multiple Vitamin (MULTI-VITAMIN) tablet Take 1 tablet by mouth daily.     olmesartan (BENICAR) 40 MG tablet Take 1 tablet (40 mg total) by mouth daily. 90 tablet 0   ondansetron (ZOFRAN-ODT) 4 MG disintegrating tablet Take 1 tablet (4 mg total) by mouth every 6 (six) hours as needed for nausea or vomiting. 20 tablet 0   pantoprazole (PROTONIX) 40 MG tablet Take 1 tablet (40 mg total) by mouth daily. Take this medication daily, regardless of reflux symptoms 90 tablet 1   Probiotic Product (PROBIOTIC PO) Take 1 capsule by mouth daily.     sertraline (ZOLOFT) 100 MG tablet Take 2 tablets (200 mg total) by mouth daily. 180 tablet 1   fluticasone (FLONASE) 50 MCG/ACT nasal spray Place 1 spray into the nose as needed for rhinitis or allergies.     No current facility-administered medications for this visit.     Musculoskeletal: Strength & Muscle Tone: within normal limits Gait & Station: normal Patient leans: N/A  Psychiatric Specialty Exam: Review of Systems  Psychiatric/Behavioral:  The patient is nervous/anxious.     Blood pressure 127/84, pulse (!) 56, temperature 97.9 F (36.6 C), temperature source Skin, height 5\' 4"  (1.626 m), weight 180 lb 6.4 oz (81.8 kg).Body mass index is 30.97 kg/m.   General Appearance: Fairly Groomed  Eye Contact:  Fair  Speech:  Clear and Coherent  Volume:  Normal  Mood:  Anxious  Affect:  Appropriate  Thought Process:  Goal Directed and Descriptions of Associations: Intact  Orientation:  Full (Time, Place, and Person)  Thought Content: Logical   Suicidal Thoughts:  No  Homicidal Thoughts:  No  Memory:  Immediate;   Fair Recent;   Fair Remote;   Fair  Judgement:  Fair  Insight:  Fair  Psychomotor Activity:  Normal  Concentration:  Concentration: Fair and Attention Span: Fair  Recall:  [employer] of Knowledge: Fair  Language: Fair  Akathisia:  No  Handed:  Right  AIMS (if indicated): not done  Assets:  Communication Skills Desire for Improvement Housing Social Support Transportation  ADL's:  Intact  Cognition: WNL  Sleep:  Fair   Screenings: GAD-7    Garment/textile technologist Visit from 08/06/2023 in [facility] Office Visit from 07/02/2023 in [facility] Office Visit from 06/26/2023 in [facility] Office Visit from 02/22/2023 in [facility] Office Visit from 02/11/2023 in [facility]  Total GAD-7 Score 12 21 21  0 12      PHQ2-9    Flowsheet Row Office Visit from 08/06/2023 in [facility] Office Visit from 07/02/2023 in [facility] Office Visit from 06/26/2023 in [facility] Office Visit from 02/22/2023 in [facility] Office Visit from 02/11/2023 in [facility]  PHQ-2 Total Score 1 6 6  0 2  PHQ-9 Total Score 2 15 21  0 12      Flowsheet Row Office Visit from 08/06/2023 in [facility] Office Visit from 07/02/2023 in [facility] Admission (Discharged) from 02/18/2023 in [facility] [place]  C-SSRS RISK CATEGORY No Risk No Risk No Risk        Assessment and Plan: Jessica Espinoza is a 37 year old Caucasian female who has a history of GAD, MDD, history of gastric sleeve surgery, ADHD (being treated by [facility]) presented today for a follow-up appointment.  Patient currently with improvement on the current dosage of Zoloft, will continue to benefit from establishing care with a therapist, plan as noted below.  Plan GAD-improving Sertraline 200 mg p.o. daily. Hydroxyzine 12.5-25 mg p.o. daily as needed for severe anxiety attacks   MDD in partial remission Sertraline 200 mg p.o. daily Discontinue BuSpar for noncompliance. Patient was referred to IOP-noncompliant. Patient provided information for psychotherapist in the area including Ms. [facility].  Patient encouraged to establish care.  History of ADHD-currently under the care of [place] attention specialist Patient to sign an ROI to obtain medical records Continue Vyvanse 60 mg and Adderall 20 mg.     Collaboration of Care: Collaboration of Care: Referral or follow-up with counselor/therapist AEB patient Delorise Shiner to establish care with therapist.  Patient/Guardian was advised Release of Information must be obtained prior to any record release in order to collaborate their care with an outside provider. Patient/Guardian was advised if they have not already done so to contact the registration department to sign all necessary forms in order for [place] to release information regarding their care.   Consent: Patient/Guardian gives verbal consent for treatment and assignment of benefits for services provided during this visit. Patient/Guardian expressed understanding and agreed to proceed.   Follow-up in clinic in 2 to 3  months or sooner if needed.  This note was generated in part or whole with voice  recognition software. Voice recognition is usually quite accurate but there are transcription errors that can and very often do occur. I apologize for any typographical errors that were not detected and corrected.    Jomarie Longs, MD 08/07/2023, 2:31 PM

## 2023-08-06 NOTE — Patient Instructions (Addendum)
  For Therapy  Jessica Espinoza [phone]   [facility] - Tel: [phone]

## 2023-08-27 ENCOUNTER — Other Ambulatory Visit: Payer: Self-pay

## 2023-09-02 ENCOUNTER — Other Ambulatory Visit: Payer: Self-pay

## 2023-09-04 ENCOUNTER — Other Ambulatory Visit: Payer: Self-pay

## 2023-09-10 ENCOUNTER — Ambulatory Visit: Payer: Commercial Managed Care - PPO | Admitting: Dietician

## 2023-09-11 ENCOUNTER — Encounter: Payer: Self-pay | Admitting: Pharmacist

## 2023-09-11 ENCOUNTER — Other Ambulatory Visit: Payer: Self-pay

## 2023-09-18 ENCOUNTER — Encounter: Payer: Self-pay | Admitting: Pharmacist

## 2023-09-18 ENCOUNTER — Other Ambulatory Visit: Payer: Self-pay

## 2023-09-19 ENCOUNTER — Other Ambulatory Visit: Payer: Self-pay

## 2023-10-04 ENCOUNTER — Other Ambulatory Visit: Payer: Self-pay

## 2023-10-04 MED ORDER — AMPHETAMINE-DEXTROAMPHETAMINE 20 MG PO TABS
10.0000 mg | ORAL_TABLET | Freq: Every day | ORAL | 0 refills | Status: AC
Start: 2023-10-04 — End: ?
  Filled 2023-10-04 – 2023-10-08 (×2): qty 30, 30d supply, fill #0

## 2023-10-04 MED ORDER — LISDEXAMFETAMINE DIMESYLATE 60 MG PO CAPS
60.0000 mg | ORAL_CAPSULE | Freq: Every morning | ORAL | 0 refills | Status: AC
  Filled 2023-10-04 – 2023-10-08 (×2): qty 30, 30d supply, fill #0

## 2023-10-08 ENCOUNTER — Other Ambulatory Visit: Payer: Self-pay

## 2023-10-15 ENCOUNTER — Other Ambulatory Visit: Payer: Self-pay

## 2023-10-15 MED ORDER — GUANFACINE HCL ER 2 MG PO TB24
2.0000 mg | ORAL_TABLET | Freq: Every day | ORAL | 1 refills | Status: AC
  Filled 2023-10-15: qty 30, 30d supply, fill #0

## 2023-10-21 ENCOUNTER — Ambulatory Visit: Payer: 59 | Admitting: Psychiatry

## 2023-10-26 ENCOUNTER — Other Ambulatory Visit
Admission: RE | Admit: 2023-10-26 | Discharge: 2023-10-26 | Disposition: A | Discharge status: Home / Self Care | Payer: Self-pay | Source: Ambulatory Visit | Attending: Medical Genetics | Admitting: Medical Genetics

## 2023-10-26 ENCOUNTER — Other Ambulatory Visit: Payer: Self-pay | Admitting: Nurse Practitioner

## 2023-10-26 ENCOUNTER — Other Ambulatory Visit: Payer: Self-pay

## 2023-10-26 DIAGNOSIS — I10 Essential (primary) hypertension: Secondary | ICD-10-CM

## 2023-10-26 DIAGNOSIS — Z006 Encounter for examination for normal comparison and control in clinical research program: Secondary | ICD-10-CM | POA: Insufficient documentation

## 2023-10-26 MED FILL — Pantoprazole Sodium EC Tab 40 MG (Base Equiv): ORAL | 30 days supply | Qty: 30 | Fill #1 | Status: AC

## 2023-10-26 MED FILL — Aluminum Chloride Soln 20%: CUTANEOUS | 30 days supply | Qty: 60 | Fill #2 | Status: AC

## 2023-10-27 ENCOUNTER — Other Ambulatory Visit: Payer: Self-pay | Admitting: Nurse Practitioner

## 2023-10-27 ENCOUNTER — Other Ambulatory Visit: Payer: Self-pay

## 2023-10-27 DIAGNOSIS — I10 Essential (primary) hypertension: Secondary | ICD-10-CM

## 2023-10-28 ENCOUNTER — Other Ambulatory Visit: Payer: Self-pay

## 2023-10-29 ENCOUNTER — Other Ambulatory Visit: Payer: Self-pay

## 2023-10-29 MED FILL — Olmesartan Medoxomil Tab 40 MG: ORAL | 30 days supply | Qty: 30 | Fill #0 | Status: AC

## 2023-10-29 NOTE — Progress Notes (Deleted)
There were no vitals taken for this visit.   Subjective:    Patient ID: Jessica Espinoza, female    DOB: 1986-06-22, 38 y.o.   MRN: [id number]  HPI: Jessica Espinoza is a 38 y.o. female  No chief complaint on file.   Discussed the use of AI scribe software for clinical note transcription with the patient, who gave verbal consent to proceed.  History of Present Illness           08/06/2023   11:21 AM 07/02/2023    2:18 PM 06/26/2023   10:21 AM  Depression screen PHQ 2/9  Decreased Interest   3  Down, Depressed, Hopeless   3  PHQ - 2 Score   6  Altered sleeping   3  Tired, decreased energy   3  Change in appetite   0  Feeling bad or failure about yourself    3  Trouble concentrating   3  Moving slowly or fidgety/restless   3  Suicidal thoughts   0  PHQ-9 Score   21  Difficult doing work/chores   Somewhat difficult     Information is confidential and restricted. Go to Review Flowsheets to unlock data.    Relevant past medical, surgical, family and social history reviewed and updated as indicated. Interim medical history since our last visit reviewed. Allergies and medications reviewed and updated.  Review of Systems  Per HPI unless specifically indicated above     Objective:    There were no vitals taken for this visit.  {Vitals History (Optional):23777} Wt Readings from Last 3 Encounters:  06/26/23 187 lb 9.6 oz (85.1 kg)  06/25/23 190 lb 4.8 oz (86.3 kg)  03/05/23 217 lb 9.6 oz (98.7 kg)    Physical Exam  Results for orders placed or performed during the hospital encounter of 02/18/23  Pregnancy, urine POC   Collection Time: 02/18/23  9:30 AM  Result Value Ref Range   Preg Test, Ur NEGATIVE NEGATIVE  Surgical pathology   Collection Time: 02/18/23 12:18 PM  Result Value Ref Range   SURGICAL PATHOLOGY      SURGICAL PATHOLOGY CASE: [id number] PATIENT: [facility] Surgical Pathology Report     Clinical History: Morbid obesity  (crm)     FINAL MICROSCOPIC DIAGNOSIS:  A. STOMACH, GREATER CURVATURE, SLEEVE RESECTION: - Gross diagnosis only: Portion of unremarkable stomach.  GROSS DESCRIPTION:  Specimen: Received fresh is a portion of stomach with a staple line along 1 aspect Size: 19 x 4.5 x 3.0 cm Serosa: Pink-tan, smooth, and intact Mucosa/Wall: Pink-tan and rugated without distinct lesions. The specimen is for gross examination only and no sections are submitted to histology. (KW, 02/18/2023)   Final Diagnosis performed by Orene Desanctis DO.   Electronically signed 02/19/2023 Technical and / or Professional components performed at [facility], [address], [place], [place] [address].  Immunohistochemistry Technical component (if applicable) was performed at [facility]. [address], [place], [place] [address].   IMMUNOHISTOCHEMISTRY DISCLAIMER (if applicable): Some of these immunohistochemical stains may have been developed and the performance characteristics determine by [facility]. Some may not have been cleared or approved by the U.S. Food and Drug Administration. The FDA has determined that such clearance or approval is not necessary. This test is used for clinical purposes. It should not be regarded as investigational or for research. This laboratory is certified under the Clinical Laboratory Improvement Amendments of 1988 (CLIA-88) as qualified to  perform high complexity clinical laboratory testing.  The controls stained appropriately.   CBC   Collection Time: 02/19/23  4:53 AM  Result Value Ref Range   WBC 13.3 (H) 4.0 - 10.5 K/uL   RBC 4.48 3.87 - 5.11 MIL/uL   Hemoglobin 11.9 (L) 12.0 - 15.0 g/dL   HCT 96.0 45.4 - 09.8 %   MCV 81.0 80.0 - 100.0 fL   MCH 26.6 26.0 - 34.0 pg   MCHC 32.8 30.0 - 36.0 g/dL   RDW 11.9 14.7 - 82.9 %   Platelets 272 150 - 400 K/uL   nRBC 0.0 0.0 - 0.2 %  Magnesium   Collection Time:  02/19/23  4:53 AM  Result Value Ref Range   Magnesium 1.9 1.7 - 2.4 mg/dL  Comprehensive metabolic panel   Collection Time: 02/19/23  4:53 AM  Result Value Ref Range   Sodium 135 135 - 145 mmol/L   Potassium 4.4 3.5 - 5.1 mmol/L   Chloride 100 98 - 111 mmol/L   CO2 26 22 - 32 mmol/L   Glucose, Bld 123 (H) 70 - 99 mg/dL   BUN 8 6 - 20 mg/dL   Creatinine, Ser 5.62 0.44 - 1.00 mg/dL   Calcium 8.7 (L) 8.9 - 10.3 mg/dL   Total Protein 7.0 6.5 - 8.1 g/dL   Albumin 3.8 3.5 - 5.0 g/dL   AST 24 15 - 41 U/L   ALT 27 0 - 44 U/L   Alkaline Phosphatase 75 38 - 126 U/L   Total Bilirubin 0.6 0.3 - 1.2 mg/dL   GFR, Estimated >13 >08 mL/min   Anion gap 9 5 - 15   {Labs (Optional):23779}    Assessment & Plan:   Problem List Items Addressed This Visit   None    Assessment and Plan             Follow up plan: No follow-ups on file.

## 2023-10-29 NOTE — Telephone Encounter (Signed)
Requested Prescriptions  Pending Prescriptions Disp Refills   olmesartan (BENICAR) 40 MG tablet 90 tablet 0    Sig: Take 1 tablet (40 mg total) by mouth daily.     Cardiovascular:  Angiotensin Receptor Blockers Failed - 10/29/2023  4:39 PM      Failed - Cr in normal range and within 180 days    Creat  Date Value Ref Range Status  10/18/2022 0.55 0.50 - 0.97 mg/dL Final   Creatinine, Ser  Date Value Ref Range Status  02/19/2023 0.55 0.44 - 1.00 mg/dL Final   Creatinine, Urine  Date Value Ref Range Status  09/26/2015 160 mg/dL Final         Failed - K in normal range and within 180 days    Potassium  Date Value Ref Range Status  02/19/2023 4.4 3.5 - 5.1 mmol/L Final         Passed - Patient is not pregnant      Passed - Last BP in normal range    BP Readings from Last 1 Encounters:  06/26/23 120/80         Passed - Valid encounter within last 6 months    Recent Outpatient Visits           4 months ago GAD (generalized anxiety disorder)   [facility] Berniece Salines, FNP   8 months ago Essential hypertension   [facility] Berniece Salines, FNP   9 months ago Chronic right shoulder pain   [facility] Della Goo F, FNP   11 months ago Moderate episode of recurrent major depressive disorder [facility])   [facility] Della Goo F, FNP   1 year ago Moderate episode of recurrent major depressive disorder [facility] [place])   [facility] Berniece Salines, FNP       Future Appointments             Tomorrow Zane Herald, Rudolpho Sevin, FNP [facility]

## 2023-10-30 ENCOUNTER — Ambulatory Visit: Payer: Commercial Managed Care - PPO | Admitting: Psychiatry

## 2023-10-30 ENCOUNTER — Other Ambulatory Visit: Payer: Self-pay

## 2023-10-30 ENCOUNTER — Ambulatory Visit: Payer: Commercial Managed Care - PPO | Admitting: Nurse Practitioner

## 2023-11-01 ENCOUNTER — Other Ambulatory Visit: Payer: Self-pay

## 2023-11-01 ENCOUNTER — Ambulatory Visit: Payer: Commercial Managed Care - PPO | Admitting: Nurse Practitioner

## 2023-11-01 ENCOUNTER — Other Ambulatory Visit (HOSPITAL_COMMUNITY)
Admission: RE | Admit: 2023-11-01 | Discharge: 2023-11-01 | Disposition: A | Discharge status: Home / Self Care | Payer: Commercial Managed Care - PPO | Source: Ambulatory Visit | Attending: Nurse Practitioner | Admitting: Nurse Practitioner

## 2023-11-01 ENCOUNTER — Encounter: Payer: Self-pay | Admitting: Nurse Practitioner

## 2023-11-01 VITALS — BP 118/70 | HR 76 | Temp 98.0°F | Resp 16 | Ht 64.0 in | Wt 158.6 lb

## 2023-11-01 DIAGNOSIS — Z3009 Encounter for other general counseling and advice on contraception: Secondary | ICD-10-CM

## 2023-11-01 DIAGNOSIS — Z01419 Encounter for gynecological examination (general) (routine) without abnormal findings: Secondary | ICD-10-CM | POA: Insufficient documentation

## 2023-11-01 MED ORDER — DROSPIRENONE-ETHINYL ESTRADIOL 3-0.02 MG PO TABS
1.0000 | ORAL_TABLET | Freq: Every day | ORAL | 11 refills | Status: DC
  Filled 2023-11-01: qty 28, 24d supply, fill #0
  Filled 2023-11-21: qty 28, 24d supply, fill #1
  Filled 2023-12-23: qty 28, 24d supply, fill #2
  Filled 2024-01-23: qty 28, 24d supply, fill #3
  Filled 2024-02-20: qty 28, 24d supply, fill #4
  Filled 2024-03-13: qty 28, 24d supply, fill #5
  Filled 2024-04-08: qty 28, 24d supply, fill #6
  Filled 2024-05-08: qty 28, 24d supply, fill #7
  Filled 2024-06-08: qty 28, 24d supply, fill #8

## 2023-11-01 NOTE — Progress Notes (Signed)
BP 118/70 (Cuff Size: Normal)   Pulse 76   Temp 98 F (36.7 C) (Oral)   Resp 16   Ht 5\' 4"  (1.626 m)   Wt 158 lb 9.6 oz (71.9 kg)   SpO2 96%   BMI 27.22 kg/m    Subjective:    Patient ID: Jessica Espinoza, female    DOB: [birth year], 38 y.o.   MRN: [id number]  HPI: Jessica Espinoza is a 38 y.o. female  Chief Complaint  Patient presents with   Contraception    Pap smear and discuss birth control options    Discussed the use of AI scribe software for clinical note transcription with the patient, who gave verbal consent to proceed.  History of Present Illness   The patient presents for a birth control consultation. She expresses interest in starting oral contraceptive pills and has concerns about the increased risk of blood clots. She denies any history of migraines with auras. she is not a smoker.   The patient also mentions a significant weight loss of almost 100 pounds since undergoing surgery. She reports feeling good and that her body is now more responsive. She expresses a desire to avoid pregnancy at this time to enjoy her new body.  In addition, the patient is due for a Pap smear. She reports that her last Pap smear was normal and was performed around the time she had a polyp.       11/01/2023   10:23 AM 08/06/2023   11:21 AM 07/02/2023    2:18 PM  Depression screen PHQ 2/9  Decreased Interest 0    Down, Depressed, Hopeless 0    PHQ - 2 Score 0    Altered sleeping     Tired, decreased energy     Change in appetite     Feeling bad or failure about yourself      Trouble concentrating     Moving slowly or fidgety/restless     Suicidal thoughts     PHQ-9 Score     Difficult doing work/chores        Information is confidential and restricted. Go to Review Flowsheets to unlock data.    Relevant past medical, surgical, family and social history reviewed and updated as indicated. Interim medical history since our last visit reviewed. Allergies and medications  reviewed and updated.  Review of Systems  Constitutional: Negative for fever or weight change.  Respiratory: Negative for cough and shortness of breath.   Cardiovascular: Negative for chest pain or palpitations.  Gastrointestinal: Negative for abdominal pain, no bowel changes.  Musculoskeletal: Negative for gait problem or joint swelling.  Skin: Negative for rash.  Neurological: Negative for dizziness or headache.  No other specific complaints in a complete review of systems (except as listed in HPI above).      Objective:    BP 118/70 (Cuff Size: Normal)   Pulse 76   Temp 98 F (36.7 C) (Oral)   Resp 16   Ht 5\' 4"  (1.626 m)   Wt 158 lb 9.6 oz (71.9 kg)   SpO2 96%   BMI 27.22 kg/m    Wt Readings from Last 3 Encounters:  11/01/23 158 lb 9.6 oz (71.9 kg)  06/26/23 187 lb 9.6 oz (85.1 kg)  06/25/23 190 lb 4.8 oz (86.3 kg)    Physical Exam Vitals reviewed. Exam conducted with a chaperone present.  Constitutional:      Appearance: Normal appearance.  HENT:     Head: Normocephalic.  Cardiovascular:     Rate and Rhythm: Normal rate and regular rhythm.  Pulmonary:     Effort: Pulmonary effort is normal.     Breath sounds: Normal breath sounds.  Genitourinary:    Vagina: Normal.     Cervix: Normal.     Uterus: Normal.      Adnexa: Right adnexa normal and left adnexa normal.  Musculoskeletal:        General: Normal range of motion.  Skin:    General: Skin is warm and dry.  Neurological:     General: No focal deficit present.     Mental Status: She is alert and oriented to person, place, and time. Mental status is at baseline.  Psychiatric:        Mood and Affect: Mood normal.        Behavior: Behavior normal.        Thought Content: Thought content normal.        Judgment: Judgment normal.     Results for orders placed or performed during the hospital encounter of 02/18/23  Pregnancy, urine POC   Collection Time: 02/18/23  9:30 AM  Result Value Ref Range    Preg Test, Ur NEGATIVE NEGATIVE  Surgical pathology   Collection Time: 02/18/23 12:18 PM  Result Value Ref Range   SURGICAL PATHOLOGY      SURGICAL PATHOLOGY CASE: [id number] PATIENT: [facility] Surgical Pathology Report     Clinical History: Morbid obesity (crm)     FINAL MICROSCOPIC DIAGNOSIS:  A. STOMACH, GREATER CURVATURE, SLEEVE RESECTION: - Gross diagnosis only: Portion of unremarkable stomach.  GROSS DESCRIPTION:  Specimen: Received fresh is a portion of stomach with a staple line along 1 aspect Size: 19 x 4.5 x 3.0 cm Serosa: Pink-tan, smooth, and intact Mucosa/Wall: Pink-tan and rugated without distinct lesions. The specimen is for gross examination only and no sections are submitted to histology. (KW, 02/18/2023)   Final Diagnosis performed by Orene Desanctis DO.   Electronically signed 02/19/2023 Technical and / or Professional components performed at [facility], [address] [place], [place] [address].  Immunohistochemistry Technical component (if applicable) was performed at [facility]. [address], [place], [place] [address].   IMMUNOHISTOCHEMISTRY DISCLAIMER (if applicable): Some of these immunohistochemical stains may have been developed and the performance characteristics determine by [facility]. Some may not have been cleared or approved by the U.S. Food and Drug Administration. The FDA has determined that such clearance or approval is not necessary. This test is used for clinical purposes. It should not be regarded as investigational or for research. This laboratory is certified under the Clinical Laboratory Improvement Amendments of 1988 (CLIA-88) as qualified to perform high complexity clinical laboratory testing.  The controls stained appropriately.   CBC   Collection Time: 02/19/23  4:53 AM  Result Value Ref Range   WBC 13.3 (H) 4.0 - 10.5 K/uL   RBC 4.48 3.87 -  5.11 MIL/uL   Hemoglobin 11.9 (L) 12.0 - 15.0 g/dL   HCT 44.0 10.2 - 72.5 %   MCV 81.0 80.0 - 100.0 fL   MCH 26.6 26.0 - 34.0 pg   MCHC 32.8 30.0 - 36.0 g/dL   RDW 36.6 44.0 - 34.7 %   Platelets 272 150 - 400 K/uL   nRBC 0.0 0.0 - 0.2 %  Magnesium   Collection Time: 02/19/23  4:53 AM  Result Value Ref Range   Magnesium 1.9 1.7 - 2.4 mg/dL  Comprehensive metabolic panel   Collection Time: 02/19/23  4:53 AM  Result Value Ref Range   Sodium 135 135 - 145 mmol/L   Potassium 4.4 3.5 - 5.1 mmol/L   Chloride 100 98 - 111 mmol/L   CO2 26 22 - 32 mmol/L   Glucose, Bld 123 (H) 70 - 99 mg/dL   BUN 8 6 - 20 mg/dL   Creatinine, Ser 5.28 0.44 - 1.00 mg/dL   Calcium 8.7 (L) 8.9 - 10.3 mg/dL   Total Protein 7.0 6.5 - 8.1 g/dL   Albumin 3.8 3.5 - 5.0 g/dL   AST 24 15 - 41 U/L   ALT 27 0 - 44 U/L   Alkaline Phosphatase 75 38 - 126 U/L   Total Bilirubin 0.6 0.3 - 1.2 mg/dL   GFR, Estimated >41 >32 mL/min   Anion gap 9 5 - 15       Assessment & Plan:   Problem List Items Addressed This Visit   None Visit Diagnoses       Birth control counseling    -  Primary   Relevant Medications   drospirenone-ethinyl estradiol (YAZ) 3-0.02 MG tablet     Well woman exam with routine gynecological exam       Relevant Medications   drospirenone-ethinyl estradiol (YAZ) 3-0.02 MG tablet   Other Relevant Orders   Cytology - PAP        Assessment and Plan    Contraception Discussed starting oral contraceptive pill (Yaz). Reviewed risk of blood clots, particularly in smokers. No history of migraines with aura. -Start Yaz on the Sunday after the next menstrual cycle for birth control. -Sent a year's prescription to [facility] pharmacy, to be picked up monthly.  Cervical Cancer Screening Last Pap smear unknown, but previous results were normal. No current symptoms suggestive of cervical pathology. -Performed Pap smear today. -Results will be communicated in the next week.  Weight Loss Significant  weight loss since last visit, approximately 100 lbs total with 70 lbs post-surgery. Patient reports feeling good. -No specific plan discussed, continue current management.  Follow-up No specific follow-up time mentioned. Patient to contact provider with any concerns or issues.        Follow up plan: Return if symptoms worsen or fail to improve.

## 2023-11-01 NOTE — Progress Notes (Deleted)
 There were no vitals taken for this visit.   Subjective:    Patient ID: Jessica Espinoza, female    DOB: 1986-06-22, 38 y.o.   MRN: [id number]  HPI: Jessica Espinoza is a 38 y.o. female  No chief complaint on file.   Discussed the use of AI scribe software for clinical note transcription with the patient, who gave verbal consent to proceed.  History of Present Illness           08/06/2023   11:21 AM 07/02/2023    2:18 PM 06/26/2023   10:21 AM  Depression screen PHQ 2/9  Decreased Interest   3  Down, Depressed, Hopeless   3  PHQ - 2 Score   6  Altered sleeping   3  Tired, decreased energy   3  Change in appetite   0  Feeling bad or failure about yourself    3  Trouble concentrating   3  Moving slowly or fidgety/restless   3  Suicidal thoughts   0  PHQ-9 Score   21  Difficult doing work/chores   Somewhat difficult     Information is confidential and restricted. Go to Review Flowsheets to unlock data.    Relevant past medical, surgical, family and social history reviewed and updated as indicated. Interim medical history since our last visit reviewed. Allergies and medications reviewed and updated.  Review of Systems  Per HPI unless specifically indicated above     Objective:    There were no vitals taken for this visit.  {Vitals History (Optional):23777} Wt Readings from Last 3 Encounters:  06/26/23 187 lb 9.6 oz (85.1 kg)  06/25/23 190 lb 4.8 oz (86.3 kg)  03/05/23 217 lb 9.6 oz (98.7 kg)    Physical Exam  Results for orders placed or performed during the hospital encounter of 02/18/23  Pregnancy, urine POC   Collection Time: 02/18/23  9:30 AM  Result Value Ref Range   Preg Test, Ur NEGATIVE NEGATIVE  Surgical pathology   Collection Time: 02/18/23 12:18 PM  Result Value Ref Range   SURGICAL PATHOLOGY      SURGICAL PATHOLOGY CASE: [id number] PATIENT: [facility] Surgical Pathology Report     Clinical History: Morbid obesity  (crm)     FINAL MICROSCOPIC DIAGNOSIS:  A. STOMACH, GREATER CURVATURE, SLEEVE RESECTION: - Gross diagnosis only: Portion of unremarkable stomach.  GROSS DESCRIPTION:  Specimen: Received fresh is a portion of stomach with a staple line along 1 aspect Size: 19 x 4.5 x 3.0 cm Serosa: Pink-tan, smooth, and intact Mucosa/Wall: Pink-tan and rugated without distinct lesions. The specimen is for gross examination only and no sections are submitted to histology. (KW, 02/18/2023)   Final Diagnosis performed by Orene Desanctis DO.   Electronically signed 02/19/2023 Technical and / or Professional components performed at [facility], [address], [place], [place] [address].  Immunohistochemistry Technical component (if applicable) was performed at [facility]. [address], [place], [place] [address].   IMMUNOHISTOCHEMISTRY DISCLAIMER (if applicable): Some of these immunohistochemical stains may have been developed and the performance characteristics determine by [facility]. Some may not have been cleared or approved by the U.S. Food and Drug Administration. The FDA has determined that such clearance or approval is not necessary. This test is used for clinical purposes. It should not be regarded as investigational or for research. This laboratory is certified under the Clinical Laboratory Improvement Amendments of 1988 (CLIA-88) as qualified to  perform high complexity clinical laboratory testing.  The controls stained appropriately.   CBC   Collection Time: 02/19/23  4:53 AM  Result Value Ref Range   WBC 13.3 (H) 4.0 - 10.5 K/uL   RBC 4.48 3.87 - 5.11 MIL/uL   Hemoglobin 11.9 (L) 12.0 - 15.0 g/dL   HCT 96.0 45.4 - 09.8 %   MCV 81.0 80.0 - 100.0 fL   MCH 26.6 26.0 - 34.0 pg   MCHC 32.8 30.0 - 36.0 g/dL   RDW 11.9 14.7 - 82.9 %   Platelets 272 150 - 400 K/uL   nRBC 0.0 0.0 - 0.2 %  Magnesium   Collection Time:  02/19/23  4:53 AM  Result Value Ref Range   Magnesium 1.9 1.7 - 2.4 mg/dL  Comprehensive metabolic panel   Collection Time: 02/19/23  4:53 AM  Result Value Ref Range   Sodium 135 135 - 145 mmol/L   Potassium 4.4 3.5 - 5.1 mmol/L   Chloride 100 98 - 111 mmol/L   CO2 26 22 - 32 mmol/L   Glucose, Bld 123 (H) 70 - 99 mg/dL   BUN 8 6 - 20 mg/dL   Creatinine, Ser 5.62 0.44 - 1.00 mg/dL   Calcium 8.7 (L) 8.9 - 10.3 mg/dL   Total Protein 7.0 6.5 - 8.1 g/dL   Albumin 3.8 3.5 - 5.0 g/dL   AST 24 15 - 41 U/L   ALT 27 0 - 44 U/L   Alkaline Phosphatase 75 38 - 126 U/L   Total Bilirubin 0.6 0.3 - 1.2 mg/dL   GFR, Estimated >13 >08 mL/min   Anion gap 9 5 - 15   {Labs (Optional):23779}    Assessment & Plan:   Problem List Items Addressed This Visit   None    Assessment and Plan             Follow up plan: No follow-ups on file.

## 2023-11-05 LAB — GENECONNECT MOLECULAR SCREEN: Genetic Analysis Overall Interpretation: NEGATIVE

## 2023-11-05 LAB — CYTOLOGY - PAP
Adequacy: ABSENT
Chlamydia: NEGATIVE
Comment: NEGATIVE
Comment: NEGATIVE
Comment: NORMAL
Diagnosis: NEGATIVE
High risk HPV: NEGATIVE
Neisseria Gonorrhea: NEGATIVE

## 2023-11-06 ENCOUNTER — Encounter: Payer: Self-pay | Admitting: Nurse Practitioner

## 2023-11-10 ENCOUNTER — Other Ambulatory Visit: Payer: Self-pay

## 2023-11-11 ENCOUNTER — Ambulatory Visit: Payer: Self-pay

## 2023-11-11 ENCOUNTER — Ambulatory Visit: Payer: Commercial Managed Care - PPO | Admitting: Nurse Practitioner

## 2023-11-11 ENCOUNTER — Other Ambulatory Visit: Payer: Self-pay | Admitting: Student

## 2023-11-11 ENCOUNTER — Encounter: Payer: Self-pay | Admitting: Nurse Practitioner

## 2023-11-11 ENCOUNTER — Other Ambulatory Visit: Payer: Self-pay

## 2023-11-11 VITALS — BP 104/68 | HR 100 | Temp 97.6°F | Resp 18 | Ht 64.0 in | Wt 155.7 lb

## 2023-11-11 DIAGNOSIS — R112 Nausea with vomiting, unspecified: Secondary | ICD-10-CM

## 2023-11-11 DIAGNOSIS — R82998 Other abnormal findings in urine: Secondary | ICD-10-CM | POA: Diagnosis not present

## 2023-11-11 LAB — POCT URINALYSIS DIPSTICK
Bilirubin, UA: POSITIVE
Blood, UA: NEGATIVE
Glucose, UA: NEGATIVE
Ketones, UA: POSITIVE
Leukocytes, UA: NEGATIVE
Nitrite, UA: NEGATIVE
Odor: NORMAL
Protein, UA: NEGATIVE
Spec Grav, UA: 1.015 (ref 1.010–1.025)
Urobilinogen, UA: 0.2 U/dL
pH, UA: 6.5 (ref 5.0–8.0)

## 2023-11-11 LAB — POC COVID19/FLU A&B COMBO
Covid Antigen, POC: NEGATIVE
Influenza A Antigen, POC: NEGATIVE
Influenza B Antigen, POC: NEGATIVE

## 2023-11-11 MED ORDER — ONDANSETRON 4 MG PO TBDP
4.0000 mg | ORAL_TABLET | Freq: Three times a day (TID) | ORAL | 0 refills | Status: DC | PRN
  Filled 2023-11-11: qty 20, 7d supply, fill #0

## 2023-11-11 NOTE — Progress Notes (Signed)
 BP 104/68   Pulse 100   Temp 97.6 F (36.4 C)   Resp 18   Ht 5\' 4"  (1.626 m)   Wt 155 lb 11.2 oz (70.6 kg)   SpO2 97%   BMI 26.73 kg/m    Subjective:    Patient ID: Jessica Espinoza, female    DOB: 1986-08-10, 38 y.o.   MRN: [id number]  HPI: Jessica Espinoza is a 38 y.o. female  Chief Complaint  Patient presents with   Emesis    W/ nausea and orange urine.  Onset yesterday    Discussed the use of AI scribe software for clinical note transcription with the patient, who gave verbal consent to proceed.  History of Present Illness   The patient, with a history of bariatric surgery, presents with acute onset of vomiting and diarrhea that started yesterday afternoon after eating. The urine is described as 'bright carrot orange.' She also reports concurrent sweating, but denies fever. She has developed back pain and abdominal pain under the ribs. She woke up with a severe headache, which has since improved, and attributes it to dehydration. She has been able to tolerate ice chips. She has been in contact with her bariatric specialist who prescribed Zofran . She denies any upper respiratory symptoms such as cough or congestion.       11/11/2023    1:44 PM 11/01/2023   10:23 AM 08/06/2023   11:21 AM  Depression screen PHQ 2/9  Decreased Interest 0 0   Down, Depressed, Hopeless 0 0   PHQ - 2 Score 0 0   Altered sleeping     Tired, decreased energy     Change in appetite     Feeling bad or failure about yourself      Trouble concentrating     Moving slowly or fidgety/restless     Suicidal thoughts     PHQ-9 Score     Difficult doing work/chores        Information is confidential and restricted. Go to Review Flowsheets to unlock data.    Relevant past medical, surgical, family and social history reviewed and updated as indicated. Interim medical history since our last visit reviewed. Allergies and medications reviewed and updated.  Review of Systems  Ten systems reviewed and  is negative except as mentioned in HPI      Objective:    BP 104/68   Pulse 100   Temp 97.6 F (36.4 C)   Resp 18   Ht 5\' 4"  (1.626 m)   Wt 155 lb 11.2 oz (70.6 kg)   SpO2 97%   BMI 26.73 kg/m    Wt Readings from Last 3 Encounters:  11/11/23 155 lb 11.2 oz (70.6 kg)  11/01/23 158 lb 9.6 oz (71.9 kg)  06/26/23 187 lb 9.6 oz (85.1 kg)    Physical Exam Vitals reviewed.  Constitutional:      Appearance: Normal appearance.  HENT:     Head: Normocephalic.  Cardiovascular:     Rate and Rhythm: Normal rate.  Pulmonary:     Effort: Pulmonary effort is normal.  Neurological:     General: No focal deficit present.     Mental Status: She is alert and oriented to person, place, and time. Mental status is at baseline.  Psychiatric:        Mood and Affect: Mood normal.        Behavior: Behavior normal.        Thought Content: Thought content normal.  Judgment: Judgment normal.         Assessment & Plan:   Problem List Items Addressed This Visit   None Visit Diagnoses       Nausea and vomiting, unspecified vomiting type    -  Primary   Relevant Orders   POC Covid19/Flu A&B Antigen (Completed)     Orange-colored urine       Relevant Orders   POCT Urinalysis Dipstick (Completed)        Assessment and Plan    Acute Gastroenteritis Sudden onset of vomiting, diarrhea, and abdominal pain. No fever, cough, or congestion. Negative for flu and COVID. Likely dehydration due to symptoms. -Take prescribed Zofran , wait 30 minutes, and attempt to hold down fluids. -If unable to hold down fluids, proceed to ER for IV fluids. patient did report that bariatric provider could get patient in for iv fluids.  -Update provider on progress via message.  Back Pain Likely secondary to dehydration. -urine dip negative for leukocytes -Expected to improve with rehydration.  Headache Likely secondary to dehydration. -Expected to improve with rehydration.        After able to  hold fluids down can follow BRAT diet.   Follow up plan: Return if symptoms worsen or fail to improve.

## 2023-11-11 NOTE — Telephone Encounter (Signed)
  Chief Complaint: Vomiting and diarrhea, orange urine Symptoms: above Frequency: yesterday afternoon Pertinent Negatives: Patient denies fever Disposition: [] ED /[] Urgent Care (no appt availability in office) / [x] Appointment(In office/virtual)/ []  [facility]/ [] Home Care/ [] Refused Recommended Disposition /[] [facility]/ []  Follow-up with PCP Additional Notes: Pt started vomiting yesterday afternoon, and has not been able to keep anything down. Pt has also had 3 bouts of diarrhea.  Pt reports orange urine. PT had gastric sleeve sx in May and may not have been keeping herself properly hydrated. PT works in English as a second language teacher at [employer].  Appt for this afternoon.    Summary: vomiting and diarrhea   Pt states she has been vomiting and has diarrhea. Pt denies abdominal pain. Pt believes she is dehydrated and  noticed her urine was orange in color.  Pt seeking clinical advice.     Reason for Disposition  [1] MILD or MODERATE vomiting AND [2] present > 48 hours (2 days) (Exception: Mild vomiting with associated diarrhea.)  Answer Assessment - Initial Assessment Questions 1. VOMITING SEVERITY: "How many times have you vomited in the past 24 hours?"     - MILD:  1 - 2 times/day    - MODERATE: 3 - 5 times/day, decreased oral intake without significant weight loss or symptoms of dehydration    - SEVERE: 6 or more times/day, vomits everything or nearly everything, with significant weight loss, symptoms of dehydration      10 times 2. ONSET: "When did the vomiting begin?"      Yesterday at 4pm 3. FLUIDS: "What fluids or food have you vomited up today?" "Have you been able to keep any fluids down?"     everything 4. ABDOMEN PAIN: "Are your having any abdomen pain?" If Yes : "How bad is it and what does it feel like?" (e.g., crampy, dull, intermittent, constant)      Tightness and bloating 5. DIARRHEA: "Is there any diarrhea?" If Yes, ask: "How many times today?"      Yes - 3 times  - liquid 6. CONTACTS: "Is there anyone else in the family with the same symptoms?"      no 7. CAUSE: "What do you think is causing your vomiting?"     Works at [employer] 8. HYDRATION STATUS: "Any signs of dehydration?" (e.g., dry mouth [not only dry lips], too weak to stand) "When did you last urinate?"     Urine is orange 9. OTHER SYMPTOMS: "Do you have any other symptoms?" (e.g., fever, headache, vertigo, vomiting blood or coffee grounds, recent head injury)     Sweating, no fever, Has HA.  Protocols used: Vomiting-A-AH

## 2023-11-11 NOTE — Progress Notes (Signed)
 Orders placed for IV fluids Nazyia Gaugh, PA-C

## 2023-11-12 ENCOUNTER — Ambulatory Visit
Admission: RE | Admit: 2023-11-12 | Discharge: 2023-11-12 | Disposition: A | Discharge status: Home / Self Care | Payer: Commercial Managed Care - PPO | Source: Ambulatory Visit | Attending: Surgery

## 2023-11-12 DIAGNOSIS — R112 Nausea with vomiting, unspecified: Secondary | ICD-10-CM | POA: Diagnosis present

## 2023-11-12 MED ORDER — ONDANSETRON HCL 4 MG/2ML IJ SOLN
INTRAMUSCULAR | Status: AC
  Filled 2023-11-12: qty 2

## 2023-11-12 MED ORDER — THIAMINE HCL 100 MG/ML IJ SOLN
Freq: Once | INTRAVENOUS | Status: AC
  Filled 2023-11-12: qty 1000

## 2023-11-12 MED ORDER — ONDANSETRON 4 MG PO TBDP
4.0000 mg | ORAL_TABLET | ORAL | Status: DC | PRN
Start: 2023-11-12 — End: 2023-11-13

## 2023-11-12 MED ORDER — ONDANSETRON HCL 4 MG/2ML IJ SOLN
4.0000 mg | INTRAMUSCULAR | Status: DC | PRN
Start: 2023-11-12 — End: 2023-11-13
  Administered 2023-11-12: 4 mg via INTRAVENOUS

## 2023-11-12 MED ORDER — ONDANSETRON HCL 4 MG PO TABS
ORAL_TABLET | ORAL | Status: AC
  Filled 2023-11-12: qty 1

## 2023-11-12 MED ORDER — SODIUM CHLORIDE 0.9 % IV BOLUS
1000.0000 mL | Freq: Once | INTRAVENOUS | Status: AC
  Administered 2023-11-12: 1000 mL via INTRAVENOUS

## 2023-11-21 ENCOUNTER — Other Ambulatory Visit: Payer: Self-pay

## 2023-12-23 ENCOUNTER — Other Ambulatory Visit: Payer: Self-pay

## 2023-12-23 MED ORDER — ONDANSETRON 4 MG PO TBDP
4.0000 mg | ORAL_TABLET | Freq: Three times a day (TID) | ORAL | 0 refills | Status: DC | PRN
  Filled 2023-12-23: qty 20, 7d supply, fill #0

## 2023-12-23 MED FILL — Aluminum Chloride Soln 20%: CUTANEOUS | 30 days supply | Qty: 60 | Fill #3 | Status: AC

## 2023-12-23 MED FILL — Olmesartan Medoxomil Tab 40 MG: ORAL | 30 days supply | Qty: 30 | Fill #1 | Status: AC

## 2023-12-23 MED FILL — Pantoprazole Sodium EC Tab 40 MG (Base Equiv): ORAL | 30 days supply | Qty: 30 | Fill #2 | Status: AC

## 2024-01-23 ENCOUNTER — Other Ambulatory Visit: Payer: Self-pay

## 2024-01-23 MED ORDER — GUANFACINE HCL ER 2 MG PO TB24
ORAL_TABLET | Freq: Every evening | ORAL | 1 refills | Status: AC
  Filled 2024-01-23 – 2024-02-20 (×2): qty 30, 30d supply, fill #0

## 2024-01-23 MED ORDER — LISDEXAMFETAMINE DIMESYLATE 60 MG PO CAPS
60.0000 mg | ORAL_CAPSULE | Freq: Every morning | ORAL | 0 refills | Status: DC
  Filled 2024-01-23 – 2024-02-20 (×2): qty 30, 30d supply, fill #0

## 2024-01-23 MED ORDER — AMPHETAMINE-DEXTROAMPHETAMINE 20 MG PO TABS
10.0000 mg | ORAL_TABLET | Freq: Every day | ORAL | 0 refills | Status: DC
  Filled 2024-01-23 – 2024-02-20 (×2): qty 30, 30d supply, fill #0

## 2024-01-23 MED FILL — Olmesartan Medoxomil Tab 40 MG: ORAL | 30 days supply | Qty: 30 | Fill #2 | Status: AC

## 2024-01-23 MED FILL — Pantoprazole Sodium EC Tab 40 MG (Base Equiv): ORAL | 30 days supply | Qty: 30 | Fill #3 | Status: AC

## 2024-01-24 ENCOUNTER — Other Ambulatory Visit (HOSPITAL_COMMUNITY): Payer: Self-pay

## 2024-02-03 ENCOUNTER — Other Ambulatory Visit: Payer: Self-pay

## 2024-02-20 ENCOUNTER — Other Ambulatory Visit: Payer: Self-pay | Admitting: Nurse Practitioner

## 2024-02-20 ENCOUNTER — Other Ambulatory Visit: Payer: Self-pay

## 2024-02-20 DIAGNOSIS — I10 Essential (primary) hypertension: Secondary | ICD-10-CM

## 2024-02-21 ENCOUNTER — Other Ambulatory Visit: Payer: Self-pay

## 2024-02-21 MED FILL — Pantoprazole Sodium EC Tab 40 MG (Base Equiv): ORAL | 30 days supply | Qty: 30 | Fill #0 | Status: CN

## 2024-02-21 NOTE — Telephone Encounter (Signed)
 Requested medications are due for refill today.  yes  Requested medications are on the active medications list. yes  Last refill. 10/29/2023 #90 0 rf  Future visit scheduled.   no  Notes to clinic.  Labs are expired.    Requested Prescriptions  Pending Prescriptions Disp Refills   olmesartan  (BENICAR ) 40 MG tablet 90 tablet 0    Sig: Take 1 tablet (40 mg total) by mouth daily.     Cardiovascular:  Angiotensin Receptor Blockers Failed - 02/21/2024  5:51 PM      Failed - Cr in normal range and within 180 days    Creat  Date Value Ref Range Status  10/18/2022 0.55 0.50 - 0.97 mg/dL Final   Creatinine, Ser  Date Value Ref Range Status  02/19/2023 0.55 0.44 - 1.00 mg/dL Final   Creatinine, Urine  Date Value Ref Range Status  09/26/2015 160 mg/dL Final         Failed - K in normal range and within 180 days    Potassium  Date Value Ref Range Status  02/19/2023 4.4 3.5 - 5.1 mmol/L Final         Passed - Patient is not pregnant      Passed - Last BP in normal range    BP Readings from Last 1 Encounters:  11/11/23 104/68         Passed - Valid encounter within last 6 months    Recent Outpatient Visits           3 months ago Nausea and vomiting, unspecified vomiting type   [facility] [place], [place]              Signed Prescriptions Disp Refills   pantoprazole  (PROTONIX ) 40 MG tablet 90 tablet 1    Sig: Take 1 tablet (40 mg total) by mouth daily. Take this medication daily, regardless of reflux symptoms     Gastroenterology: Proton Pump Inhibitors Passed - 02/21/2024  5:51 PM      Passed - Valid encounter within last 12 months    Recent Outpatient Visits           3 months ago Nausea and vomiting, unspecified vomiting type   [facility] [place], [place]

## 2024-02-21 NOTE — Telephone Encounter (Signed)
 Requested Prescriptions  Pending Prescriptions Disp Refills   olmesartan  (BENICAR ) 40 MG tablet 90 tablet 0    Sig: Take 1 tablet (40 mg total) by mouth daily.     Cardiovascular:  Angiotensin Receptor Blockers Failed - 02/21/2024  5:51 PM      Failed - Cr in normal range and within 180 days    Creat  Date Value Ref Range Status  10/18/2022 0.55 0.50 - 0.97 mg/dL Final   Creatinine, Ser  Date Value Ref Range Status  02/19/2023 0.55 0.44 - 1.00 mg/dL Final   Creatinine, Urine  Date Value Ref Range Status  09/26/2015 160 mg/dL Final         Failed - K in normal range and within 180 days    Potassium  Date Value Ref Range Status  02/19/2023 4.4 3.5 - 5.1 mmol/L Final         Passed - Patient is not pregnant      Passed - Last BP in normal range    BP Readings from Last 1 Encounters:  11/11/23 104/68         Passed - Valid encounter within last 6 months    Recent Outpatient Visits           3 months ago Nausea and vomiting, unspecified vomiting type   [facility] [place], FNP               pantoprazole  (PROTONIX ) 40 MG tablet 90 tablet 1    Sig: Take 1 tablet (40 mg total) by mouth daily. Take this medication daily, regardless of reflux symptoms     Gastroenterology: Proton Pump Inhibitors Passed - 02/21/2024  5:51 PM      Passed - Valid encounter within last 12 months    Recent Outpatient Visits           3 months ago Nausea and vomiting, unspecified vomiting type   [facility] [place], [place]

## 2024-02-22 ENCOUNTER — Other Ambulatory Visit: Payer: Self-pay

## 2024-02-23 ENCOUNTER — Other Ambulatory Visit: Payer: Self-pay

## 2024-02-25 ENCOUNTER — Other Ambulatory Visit: Payer: Self-pay

## 2024-03-06 ENCOUNTER — Other Ambulatory Visit: Payer: Self-pay

## 2024-03-13 ENCOUNTER — Other Ambulatory Visit: Payer: Self-pay | Admitting: Nurse Practitioner

## 2024-03-13 ENCOUNTER — Other Ambulatory Visit: Payer: Self-pay

## 2024-03-13 DIAGNOSIS — I10 Essential (primary) hypertension: Secondary | ICD-10-CM

## 2024-03-13 MED FILL — Pantoprazole Sodium EC Tab 40 MG (Base Equiv): ORAL | 30 days supply | Qty: 30 | Fill #0 | Status: AC

## 2024-03-14 ENCOUNTER — Other Ambulatory Visit: Payer: Self-pay

## 2024-03-16 ENCOUNTER — Other Ambulatory Visit: Payer: Self-pay

## 2024-03-16 MED FILL — Olmesartan Medoxomil Tab 40 MG: ORAL | 30 days supply | Qty: 30 | Fill #0 | Status: AC

## 2024-03-16 NOTE — Telephone Encounter (Signed)
 Requested Prescriptions  Pending Prescriptions Disp Refills   olmesartan  (BENICAR ) 40 MG tablet 90 tablet 0    Sig: Take 1 tablet (40 mg total) by mouth daily.     Cardiovascular:  Angiotensin Receptor Blockers Failed - 03/16/2024  3:48 PM      Failed - Cr in normal range and within 180 days    Creat  Date Value Ref Range Status  10/18/2022 0.55 0.50 - 0.97 mg/dL Final   Creatinine, Ser  Date Value Ref Range Status  02/19/2023 0.55 0.44 - 1.00 mg/dL Final   Creatinine, Urine  Date Value Ref Range Status  09/26/2015 160 mg/dL Final         Failed - K in normal range and within 180 days    Potassium  Date Value Ref Range Status  02/19/2023 4.4 3.5 - 5.1 mmol/L Final         Passed - Patient is not pregnant      Passed - Last BP in normal range    BP Readings from Last 1 Encounters:  11/11/23 104/68         Passed - Valid encounter within last 6 months    Recent Outpatient Visits           4 months ago Nausea and vomiting, unspecified vomiting type   [facility] [place], [place]

## 2024-04-08 ENCOUNTER — Other Ambulatory Visit: Payer: Self-pay

## 2024-04-09 ENCOUNTER — Other Ambulatory Visit: Payer: Self-pay

## 2024-04-09 MED ORDER — ONDANSETRON 4 MG PO TBDP
4.0000 mg | ORAL_TABLET | Freq: Three times a day (TID) | ORAL | 0 refills | Status: DC | PRN
  Filled 2024-04-09: qty 20, 7d supply, fill #0

## 2024-04-15 ENCOUNTER — Other Ambulatory Visit: Payer: Self-pay

## 2024-04-15 MED ORDER — AMPHETAMINE-DEXTROAMPHETAMINE 20 MG PO TABS
10.0000 mg | ORAL_TABLET | Freq: Every day | ORAL | 0 refills | Status: DC
  Filled 2024-04-15: qty 30, 30d supply, fill #0

## 2024-04-15 MED ORDER — LISDEXAMFETAMINE DIMESYLATE 60 MG PO CAPS
60.0000 mg | ORAL_CAPSULE | Freq: Every morning | ORAL | 0 refills | Status: DC
  Filled 2024-04-15: qty 30, 30d supply, fill #0

## 2024-04-15 MED ORDER — GUANFACINE HCL ER 2 MG PO TB24
2.0000 mg | ORAL_TABLET | Freq: Every day | ORAL | 1 refills | Status: AC
  Filled 2024-04-15: qty 30, 30d supply, fill #0

## 2024-05-08 ENCOUNTER — Telehealth: Admitting: Family Medicine

## 2024-05-08 ENCOUNTER — Emergency Department

## 2024-05-08 ENCOUNTER — Other Ambulatory Visit: Payer: Self-pay

## 2024-05-08 ENCOUNTER — Emergency Department
Admission: EM | Admit: 2024-05-08 | Discharge: 2024-05-08 | Disposition: A | Discharge status: Home / Self Care | Attending: Emergency Medicine | Admitting: Emergency Medicine

## 2024-05-08 DIAGNOSIS — R42 Dizziness and giddiness: Secondary | ICD-10-CM | POA: Diagnosis present

## 2024-05-08 DIAGNOSIS — I10 Essential (primary) hypertension: Secondary | ICD-10-CM | POA: Diagnosis not present

## 2024-05-08 DIAGNOSIS — R079 Chest pain, unspecified: Secondary | ICD-10-CM | POA: Diagnosis not present

## 2024-05-08 DIAGNOSIS — Z139 Encounter for screening, unspecified: Secondary | ICD-10-CM

## 2024-05-08 LAB — CBC
HCT: 33.8 % — ABNORMAL LOW (ref 36.0–46.0)
Hemoglobin: 11.1 g/dL — ABNORMAL LOW (ref 12.0–15.0)
MCH: 25.4 pg — ABNORMAL LOW (ref 26.0–34.0)
MCHC: 32.8 g/dL (ref 30.0–36.0)
MCV: 77.3 fL — ABNORMAL LOW (ref 80.0–100.0)
Platelets: 214 K/uL (ref 150–400)
RBC: 4.37 MIL/uL (ref 3.87–5.11)
RDW: 12.9 % (ref 11.5–15.5)
WBC: 4.9 K/uL (ref 4.0–10.5)
nRBC: 0 % (ref 0.0–0.2)

## 2024-05-08 LAB — BASIC METABOLIC PANEL WITH GFR
Anion gap: 11 (ref 5–15)
BUN: 9 mg/dL (ref 6–20)
CO2: 25 mmol/L (ref 22–32)
Calcium: 9.3 mg/dL (ref 8.9–10.3)
Chloride: 101 mmol/L (ref 98–111)
Creatinine, Ser: 0.54 mg/dL (ref 0.44–1.00)
GFR, Estimated: >60 mL/min (ref >60)
Glucose, Bld: 101 mg/dL — ABNORMAL HIGH (ref 70–99)
Potassium: 3.9 mmol/L (ref 3.5–5.1)
Sodium: 137 mmol/L (ref 135–145)

## 2024-05-08 LAB — TROPONIN I (HIGH SENSITIVITY): Troponin I (High Sensitivity): 6 ng/L (ref <18)

## 2024-05-08 MED ORDER — OLMESARTAN MEDOXOMIL 20 MG PO TABS
20.0000 mg | ORAL_TABLET | Freq: Every day | ORAL | 2 refills | Status: AC
  Filled 2024-05-08: qty 30, 30d supply, fill #0

## 2024-05-08 NOTE — ED Provider Notes (Signed)
 [facility] Provider Note    Event Date/Time   First MD Initiated Contact with Patient 05/08/24 1710     (approximate)   History   Hypertension   HPI  Yamila A Boltz is a 38 y.o. female who presents to the ED for evaluation of Hypertension   Review of virtual PCP visit from earlier today.  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  Patient presents for evaluation of multiple symptoms and elevated blood pressure today.  She works as a Doctor, hospital.  After getting to work, while seated, she developed a sensation of feeling off.  She is unable to elaborate on the symptoms and reports she just felt off in a generalized fashion.  Reports later developing dizziness that she describes as a combination of lightheadedness and unsteadiness  She went home from work and had a video visit with her PCP who advised her to come to the ED to get evaluated.   Physical Exam   Triage Vital Signs: ED Triage Vitals [05/08/24 1653]  Encounter Vitals Group     BP (!) 164/97     Girls Systolic BP Percentile      Girls Diastolic BP Percentile      Boys Systolic BP Percentile      Boys Diastolic BP Percentile      Pulse Rate 64     Resp 16     Temp 97.6 F (36.4 C)     Temp Source Oral     SpO2 98 %     Weight      Height 5' 4 (1.626 m)     Head Circumference      Peak Flow      Pain Score 0     Pain Loc      Pain Education      Exclude from Growth Chart     Most recent vital signs: Vitals:   05/08/24 1653  BP: (!) 164/97  Pulse: 64  Resp: 16  Temp: 97.6 F (36.4 C)  SpO2: 98%    General: Awake, no distress.  CV:  Good peripheral perfusion.  Resp:  Normal effort.  Abd:  No distention.  MSK:  No deformity noted.  Neuro:  No focal deficits appreciated. Cranial nerves II through XII intact 5/5 strength and sensation in all 4 extremities Other:     ED  Results / Procedures / Treatments   Labs (all labs ordered are listed, but only abnormal results are displayed) Labs Reviewed  BASIC METABOLIC PANEL WITH GFR - Abnormal; Notable for the following components:      Result Value   Glucose, Bld 101 (*)    All other components within normal limits  CBC - Abnormal; Notable for the following components:   Hemoglobin 11.1 (*)    HCT 33.8 (*)    MCV 77.3 (*)    MCH 25.4 (*)    All other components within normal limits  POC URINE PREG, ED  TROPONIN I (HIGH SENSITIVITY)  TROPONIN I (HIGH SENSITIVITY)    EKG   RADIOLOGY CXR interpreted by me without evidence of acute cardiopulmonary pathology.  Official radiology report(s): DG Chest 2 View Result Date: 05/08/2024 CLINICAL DATA:  Chest pain. EXAM: CHEST - 2 VIEW COMPARISON:  December 14, 2022. FINDINGS: The heart size and mediastinal contours are within normal limits. Both lungs are clear. The visualized skeletal structures are unremarkable. IMPRESSION: No active cardiopulmonary disease. Electronically Signed  By: Lynwood Landy Raddle M.D.   On: 05/08/2024 17:26    PROCEDURES and INTERVENTIONS:  Procedures  Medications - No data to display   IMPRESSION / MDM / ASSESSMENT AND PLAN / ED COURSE  I reviewed the triage vital signs and the nursing notes.  Differential diagnosis includes, but is not limited to, viral syndrome, symptomatic anemia, dehydration or AKI, electrolyte derangement, ACS, anxiety  {Patient presents with symptoms of an acute illness or injury that is potentially life-threatening.  Patient presents for evaluation after feeling off without evidence of acute pathology and suitable for outpatient management.  Mild hypertension is noted but no evidence of endorgan damage.  Looks well to me without neurologic deficits, trauma.  Feeling better by the time I see her.  Chronic mild microcytic anemia, normal metabolic panel and troponin.  Discussed getting back on lower dose of her  anti per tensive medications, following up with her PCP and close ED return precautions.      FINAL CLINICAL IMPRESSION(S) / ED DIAGNOSES   Final diagnoses:  Encounter for medical screening examination  Uncontrolled hypertension  Dizziness     Rx / DC Orders   ED Discharge Orders          Ordered    olmesartan  (BENICAR ) 20 MG tablet  Daily        05/08/24 1850             Note:  This document was prepared using Dragon voice recognition software and may include unintentional dictation errors.   Claudene Rover, MD 05/08/24 RENARD

## 2024-05-08 NOTE — Progress Notes (Signed)
 Virtual Visit Consent   Jessica Espinoza, you are scheduled for a virtual visit with a [facility] provider today. Just as with appointments in the office, your consent must be obtained to participate. Your consent will be active for this visit and any virtual visit you may have with one of our providers in the next 365 days. If you have a MyChart account, a copy of this consent can be sent to you electronically.  As this is a virtual visit, video technology does not allow for your provider to perform a traditional examination. This may limit your provider's ability to fully assess your condition. If your provider identifies any concerns that need to be evaluated in person or the need to arrange testing (such as labs, EKG, etc.), we will make arrangements to do so. Although advances in technology are sophisticated, we cannot ensure that it will always work on either your end or our end. If the connection with a video visit is poor, the visit may have to be switched to a telephone visit. With either a video or telephone visit, we are not always able to ensure that we have a secure connection.  By engaging in this virtual visit, you consent to the provision of healthcare and authorize for your insurance to be billed (if applicable) for the services provided during this visit. Depending on your insurance coverage, you may receive a charge related to this service.  I need to obtain your verbal consent now. Are you willing to proceed with your visit today? Marvene A Harshfield has provided verbal consent on 05/08/2024 for a virtual visit (video or telephone). Loa Lamp, FNP  Date: 05/08/2024 4:23 PM   Virtual Visit via Video Note   I, Loa Lamp, connected with  Cyril A Rhew  ([id number], [birth year]) on 05/08/24 at  4:15 PM EDT by a video-enabled telemedicine application and verified that I am speaking with the correct person using two identifiers.  Location: Patient: Virtual Visit Location Patient:  Home Provider: Virtual Visit Location Provider: Home Office   I discussed the limitations of evaluation and management by telemedicine and the availability of in person appointments. The patient expressed understanding and agreed to proceed.    History of Present Illness: Jessica Espinoza is a 38 y.o. who identifies as a female who was assigned female at birth, and is being seen today for felt off. She had weight loss surgery and was able to come off BP meds. Today however BP is 140's over 90's. She complains of chest tightness, nausea, dizziness.   HPI: HPI  Problems:  Patient Active Problem List   Diagnosis Date Noted   Recurrent major depressive disorder, in partial remission (HCC) 08/06/2023   Morbid obesity (HCC) 02/18/2023   Episodic mood disorder (HCC) 02/11/2023   Attention and concentration deficit 02/11/2023   Moderate episode of recurrent major depressive disorder (HCC) 09/20/2022   Chronic right shoulder pain 09/20/2022   Adult ADHD 04/30/2022   Vitamin D deficiency 04/30/2022   Essential hypertension 08/09/2020   GAD (generalized anxiety disorder) 07/16/2017   Irritable bowel syndrome 07/16/2017   Obesity     Allergies:  Allergies  Allergen Reactions   Mustard Oil [Allyl Isothiocyanate] Swelling and Rash   Medications:  Current Outpatient Medications:    acetaminophen  (TYLENOL ) 500 MG tablet, Take 1,000 mg by mouth as needed for moderate pain., Disp: , Rfl:    albuterol  (VENTOLIN  HFA) 108 (90 Base) MCG/ACT inhaler, Inhale 1-2 puffs into the lungs every 6 (six)  hours as needed. (Patient taking differently: Inhale 2 puffs into the lungs every 6 (six) hours as needed for wheezing or shortness of breath.), Disp: 6.7 g, Rfl: 0   aluminum  chloride (DRYSOL) 20 % external solution, Apply 1 Application topically every 14 (fourteen) days., Disp: 60 mL, Rfl: 3   amphetamine -dextroamphetamine  (ADDERALL) 20 MG tablet, Take 0.5-1 tablets (10-20 mg total) by mouth daily in the  afternoon., Disp: 30 tablet, Rfl: 0   amphetamine -dextroamphetamine  (ADDERALL) 20 MG tablet, Take 0.5-1 tablets (10-20 mg total) by mouth daily in the afternoon., Disp: 30 tablet, Rfl: 0   amphetamine -dextroamphetamine  (ADDERALL) 20 MG tablet, Take 0.5-1 tablets (10-20 mg total) by mouth daily in the afternoon., Disp: 30 tablet, Rfl: 0   cetirizine (ZYRTEC) 10 MG tablet, Take 10 mg by mouth daily., Disp: , Rfl:    Cholecalciferol 50 MCG (2000 UT) TABS, Take 2,000 Units by mouth daily., Disp: , Rfl:    dicyclomine  (BENTYL ) 20 MG tablet, Take 1 tablet (20 mg total) by mouth every 6 (six) hours as needed. (Patient taking differently: Take 20 mg by mouth as needed for spasms.), Disp: 30 tablet, Rfl: 1   drospirenone -ethinyl estradiol  (YAZ) 3-0.02 MG tablet, Take 1 tablet by mouth daily. Take one active hormone pill by mouth once daily (and discard last 4 placebo pills of each pack), Disp: 30 tablet, Rfl: 11   fluticasone (FLONASE) 50 MCG/ACT nasal spray, Place 1 spray into the nose as needed for rhinitis or allergies., Disp: , Rfl:    guanFACINE  (INTUNIV ) 2 MG TB24 ER tablet, Take 0.5 tablet (1 mg) - 1 tablet (2 mg total) by mouth at bedtime., Disp: 30 tablet, Rfl: 1   guanFACINE  (INTUNIV ) 2 MG TB24 ER tablet, Take 0.5-1 (1-2mg ) tablets by mouth at bedtime., Disp: 90 tablet, Rfl: 1   guanFACINE  (INTUNIV ) 2 MG TB24 ER tablet, Take 0.5-1 tablet (1 to 2 mg total) by mouth at bedtime., Disp: 90 tablet, Rfl: 1   hydrOXYzine  (ATARAX ) 25 MG tablet, Take 0.5-1 tablets (12.5-25 mg total) by mouth 2 (two) times daily as needed for anxiety., Disp: 60 tablet, Rfl: 1   lisdexamfetamine (VYVANSE ) 60 MG capsule, Take 1 capsule (60 mg total) by mouth daily., Disp: 30 capsule, Rfl: 0   lisdexamfetamine (VYVANSE ) 60 MG capsule, Take 1 capsule (60 mg total) by mouth every morning., Disp: 30 capsule, Rfl: 0   lisdexamfetamine (VYVANSE ) 60 MG capsule, Take 1 capsule (60 mg total) by mouth in the morning., Disp: 30 capsule,  Rfl: 0   lisdexamfetamine (VYVANSE ) 60 MG capsule, Take 1 capsule (60 mg total) by mouth every morning., Disp: 30 capsule, Rfl: 0   Multiple Vitamin (MULTI-VITAMIN) tablet, Take 1 tablet by mouth daily., Disp: , Rfl:    olmesartan  (BENICAR ) 40 MG tablet, Take 1 tablet (40 mg total) by mouth daily., Disp: 90 tablet, Rfl: 0   ondansetron  (ZOFRAN -ODT) 4 MG disintegrating tablet, Take 1 tablet (4 mg total) by mouth every 6 (six) hours as needed for nausea or vomiting., Disp: 20 tablet, Rfl: 0   ondansetron  (ZOFRAN -ODT) 4 MG disintegrating tablet, Take 1 tablet (4 mg total) by mouth every 8 (eight) hours as needed., Disp: 20 tablet, Rfl: 0   pantoprazole  (PROTONIX ) 40 MG tablet, Take 1 tablet (40 mg total) by mouth daily. Take this medication daily, regardless of reflux symptoms, Disp: 90 tablet, Rfl: 1   Probiotic Product (PROBIOTIC PO), Take 1 capsule by mouth daily., Disp: , Rfl:    sertraline  (ZOLOFT ) 100 MG tablet, Take  2 tablets (200 mg total) by mouth daily., Disp: 180 tablet, Rfl: 1  Observations/Objective: Patient is well-developed, well-nourished in no acute distress.  Resting comfortably  at home.  Head is normocephalic, atraumatic.  No labored breathing.  Speech is clear and coherent with logical content.  Patient is alert and oriented at baseline.    Assessment and Plan: 1. Chest pain, unspecified type (Primary)  2. Essential hypertension  After discussion pt needs to be seen in the ED. 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 She is going back to the ED- She was still at work in the car.   Follow Up Instructions: I discussed the assessment and treatment plan with the patient. The patient was provided an opportunity to ask questions and all were answered. The patient agreed with the plan and demonstrated an understanding of the instructions.  A copy of instructions were sent to the patient via MyChart unless otherwise noted below.     The patient  was advised to call back or seek an in-person evaluation if the symptoms worsen or if the condition fails to improve as anticipated.    Orvilla Truett, FNP

## 2024-05-08 NOTE — Patient Instructions (Signed)
 Chest Pain (Angina): What to Know Angina is pain or discomfort in the chest. It can also be felt in the neck, arm, jaw, or back. Angina is caused by not having enough blood flow to the heart wall. Angina may be a warning that you're at risk for having a heart attack. What are the causes? Angina is most often caused by build-up of plaque in your arteries that makes it hard for blood to flow. Plaque narrows and blocks the arteries of the heart. Plaque is made of fats and cholesterol. Angina is also caused by: Sudden spasms of the muscles in the arteries of the heart. Small artery disease. Heart valve problems. A tear in an artery of your heart. Weakness of the heart muscle. What increases the risk? Main risks Having high cholesterol. High blood pressure. Having diabetes. Family history of heart disease. Not exercising or moving enough. Having had radiation treatment to the left side of your chest. Other risks Using tobacco products. Being very overweight. Eating foods that have a lot of unhealthy fats. Feeling stressed or having depression. Using drugs, such as cocaine. What are the signs or symptoms? Symptoms in all people Chest pain, which may: Feel like a crushing or squeezing in the chest. Feel like a tightness, pressure, or heaviness in the chest. Last for more than a few minutes at a time. Stop and come back. Pain in the neck, arm, jaw, or back. Heartburn or upset stomach for no reason. Being short of breath. Feeling like you may throw up. Sudden cold sweats. Other symptoms in females Tiredness or weakness. Worry and anxiety. Dizziness or fainting. How is this diagnosed?  Your symptoms and medical history. Blood tests. Electrocardiogram (ECG) to measure the electrical activity of your heart. Stress test to look for signs of a blocked artery. CT angiogram to examine your heart and the blood flow to it. Coronary angiogram to check for a blocked artery. How is this  treated? Medicines to: Prevent blood clots. Relax blood vessels and improve blood flow to the heart. Lower blood pressure. Reduce cholesterol. You may have a procedure called angioplasty to widen a narrowed or blocked artery. A small mesh tube called a stent may be put in the artery to keep it open. Surgery may be needed to allow blood to go around a blocked artery. Follow these instructions at home: Medicines Take your medicines only as told. Do not take these medicines unless your provider says that you can: NSAIDs, such as ibuprofen and naproxen. Supplements that contain vitamin A, vitamin E, or both. Hormone therapy that contains estrogen with or without progestin. Eating and drinking  Eat a healthy diet that includes: Lots of fresh fruits and vegetables. Whole grains. Low-fat protein. Low-fat dairy products. Follow instructions about what you may eat and drink. Activity Exercise as told. Talk with your provider about doing a program called cardiac rehab to help make your heart strong. When you feel tired, take a break. Plan breaks if you know you're going to feel tired. Lifestyle Do not smoke, vape, or use nicotine or tobacco. If your provider says you can drink alcohol: Limit how much you have to: 0-1 drink a day if you're female and not pregnant. 0-2 drinks a day if you're female. Know how much alcohol is in your drink. In the [place] one drink is one 12 oz bottle of beer (355 mL), one 5 oz glass of wine (148 mL), or one 1 oz glass of hard liquor (44 mL). General instructions  Stay at a healthy weight. If told to lose weight, work with your provider to lose weight safely. Keep your vaccines up to date. Get a flu shot every year. Learn to manage stress. If you need help, ask your provider. Talk with your provider if you feel depressed. Work with your provider to manage any other health problems that you have. These may include diabetes or high blood pressure. Keep all  follow-up visits. Your provider will want to check on your condition. Get help right away if: You have pain in your chest, neck, arm, jaw, or back, and the pain: Happens more often. Lasts more than a few minutes. Goes away and comes back. Does not get better after you take medicine under your tongue. You're dizzy or light-headed all of a sudden. You faint. You have any combination of these problems: Cold sweats. Heartburn or upset stomach. Trouble breathing. Feeling like you may throw up, or you throw up. Feeling very tired or weak. Feeling worried or nervous. These symptoms may be an emergency. Call 911 right away. Do not wait to see if the symptoms will go away. Do not drive yourself to the hospital. This information is not intended to replace advice given to you by your health care provider. Make sure you discuss any questions you have with your health care provider. Document Revised: 07/30/2023 Document Reviewed: 02/10/2023 Elsevier Patient Education  2024 [employer].

## 2024-05-08 NOTE — ED Triage Notes (Addendum)
 Pt to ed from home via POV for HTN. 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 Pt is caox4, in no acute distress and ambulatory in triage. Pt had bariatric surgery 4 months ago and pulled herself off BP meds.

## 2024-05-08 NOTE — Discharge Instructions (Addendum)
 As discussed, please keep track of your blood pressure at home and bring these numbers to your PCP.  Use a half dose of your olmesartan  (20 mg) as a starting point  Return to the ED with any worsening symptoms, passing out all the way, chest pain, sudden severe headaches, strokelike symptoms

## 2024-05-09 ENCOUNTER — Other Ambulatory Visit: Payer: Self-pay

## 2024-05-10 ENCOUNTER — Other Ambulatory Visit: Payer: Self-pay

## 2024-05-11 ENCOUNTER — Other Ambulatory Visit: Payer: Self-pay

## 2024-05-12 ENCOUNTER — Other Ambulatory Visit: Payer: Self-pay

## 2024-05-12 MED ORDER — ONDANSETRON 4 MG PO TBDP
4.0000 mg | ORAL_TABLET | Freq: Three times a day (TID) | ORAL | 0 refills | Status: DC | PRN
  Filled 2024-05-12: qty 20, 7d supply, fill #0

## 2024-05-31 ENCOUNTER — Encounter: Payer: Self-pay | Admitting: Nurse Practitioner

## 2024-06-05 ENCOUNTER — Other Ambulatory Visit: Payer: Self-pay

## 2024-06-05 MED ORDER — LISDEXAMFETAMINE DIMESYLATE 60 MG PO CAPS
60.0000 mg | ORAL_CAPSULE | Freq: Every morning | ORAL | 0 refills | Status: DC
  Filled 2024-06-05: qty 30, 30d supply, fill #0

## 2024-06-05 MED ORDER — GUANFACINE HCL ER 2 MG PO TB24
2.0000 mg | ORAL_TABLET | Freq: Every day | ORAL | 1 refills | Status: AC
  Filled 2024-06-05: qty 30, 30d supply, fill #0

## 2024-06-05 MED ORDER — AMPHETAMINE-DEXTROAMPHETAMINE 20 MG PO TABS
10.0000 mg | ORAL_TABLET | Freq: Every day | ORAL | 0 refills | Status: AC
  Filled 2024-06-05: qty 30, 30d supply, fill #0

## 2024-06-11 ENCOUNTER — Ambulatory Visit (INDEPENDENT_AMBULATORY_CARE_PROVIDER_SITE_OTHER): Admitting: Nurse Practitioner

## 2024-06-11 ENCOUNTER — Other Ambulatory Visit (HOSPITAL_COMMUNITY)
Admission: RE | Admit: 2024-06-11 | Discharge: 2024-06-11 | Disposition: A | Discharge status: Home / Self Care | Source: Ambulatory Visit | Attending: Nurse Practitioner | Admitting: Nurse Practitioner

## 2024-06-11 ENCOUNTER — Other Ambulatory Visit: Payer: Self-pay

## 2024-06-11 VITALS — BP 118/72 | HR 82 | Resp 18 | Ht 64.0 in | Wt 160.7 lb

## 2024-06-11 DIAGNOSIS — I10 Essential (primary) hypertension: Secondary | ICD-10-CM | POA: Diagnosis not present

## 2024-06-11 DIAGNOSIS — N939 Abnormal uterine and vaginal bleeding, unspecified: Secondary | ICD-10-CM | POA: Insufficient documentation

## 2024-06-11 DIAGNOSIS — D649 Anemia, unspecified: Secondary | ICD-10-CM | POA: Diagnosis not present

## 2024-06-11 LAB — POCT URINALYSIS DIPSTICK
Bilirubin, UA: NEGATIVE
Blood, UA: NEGATIVE
Glucose, UA: NEGATIVE
Ketones, UA: NEGATIVE
Leukocytes, UA: NEGATIVE
Nitrite, UA: NEGATIVE
Odor: NORMAL
Protein, UA: NEGATIVE
Spec Grav, UA: 1.025 (ref 1.010–1.025)
Urobilinogen, UA: 0.2 U/dL
pH, UA: 6.5 (ref 5.0–8.0)

## 2024-06-11 LAB — POCT URINE PREGNANCY: Preg Test, Ur: NEGATIVE

## 2024-06-11 NOTE — Progress Notes (Signed)
 BP 118/72   Pulse 82   Resp 18   Ht 5' 4 (1.626 m)   Wt 160 lb 11.2 oz (72.9 kg)   SpO2 99%   BMI 27.58 kg/m    Subjective:    Patient ID: Jessica Espinoza, female    DOB: 08-20-1986, 38 y.o.   MRN: [id number]  HPI: Jessica Espinoza is a 38 y.o. female  Chief Complaint  Patient presents with   Vaginal Bleeding    In between periods almost daily since starting [facility] yaz   Discussed the use of AI scribe software for clinical note transcription with the patient, who gave verbal consent to proceed.  History of Present Illness Jessica Espinoza is a 38 year old female who presents with breakthrough bleeding while on birth control.  Abnormal uterine bleeding - Breakthrough bleeding for the past 1-2 months, with intermittent months without bleeding - Menstrual periods have been light since starting birth control in February, except for a heavy, clotty period three months ago - Currently experiencing spotting instead of a full period - Last normal period was approximately three months ago - Bleeding during intercourse, non-painful - Occasional right-sided pelvic pain during spotting episodes - No pain during intercourse  Hypertension - Recently evaluated in the emergency room for elevated blood pressure - Currently taking 20 mg olmesartan  for blood pressure management - Resumed olmesartan  after a period of non-compliance - Concerned about the impact of abnormal uterine bleeding on blood pressure  Anemia and iron deficiency - Found to be anemic with low iron levels during recent emergency room visit  Genitourinary symptoms - Sensation of incomplete bladder emptying - Heaviness when urinating - No burning during urination  Constitutional and systemic symptoms - No chest pain - No shortness of breath - No changes in bowel habits - No blood in stool  Menopausal and endocrine concerns - Concerned about the possibility of early menopause due to family history  (mother experienced menopause symptoms at age 85-45) - Mother has thyroid  issues, raising concern for possible thyroid -related symptoms         11/11/2023    1:44 PM 11/01/2023   10:23 AM 08/06/2023   11:21 AM  Depression screen PHQ 2/9  Decreased Interest 0 0   Down, Depressed, Hopeless 0 0   PHQ - 2 Score 0 0   Altered sleeping     Tired, decreased energy     Change in appetite     Feeling bad or failure about yourself      Trouble concentrating     Moving slowly or fidgety/restless     Suicidal thoughts     PHQ-9 Score     Difficult doing work/chores        Information is confidential and restricted. Go to Review Flowsheets to unlock data.    Relevant past medical, surgical, family and social history reviewed and updated as indicated. Interim medical history since our last visit reviewed. Allergies and medications reviewed and updated.  Review of Systems  Ten systems reviewed and is negative except as mentioned in HPI      Objective:      BP 118/72   Pulse 82   Resp 18   Ht 5' 4 (1.626 m)   Wt 160 lb 11.2 oz (72.9 kg)   SpO2 99%   BMI 27.58 kg/m    Wt Readings from Last 3 Encounters:  06/11/24 160 lb 11.2 oz (72.9 kg)  11/11/23 155 lb 11.2 oz (70.6  kg)  11/01/23 158 lb 9.6 oz (71.9 kg)    Physical Exam GENERAL: Alert, cooperative, well developed, no acute distress HEENT: Normocephalic, normal oropharynx, moist mucous membranes CHEST: Clear to auscultation bilaterally, No wheezes, rhonchi, or crackles CARDIOVASCULAR: Normal heart rate and rhythm, S1 and S2 normal without murmurs ABDOMEN: Soft, non-tender, non-distended, without organomegaly, Normal bowel sounds EXTREMITIES: No cyanosis or edema NEUROLOGICAL: Cranial nerves grossly intact, Moves all extremities without gross motor or sensory deficit  Results for orders placed or performed in visit on 06/11/24  POCT urinalysis dipstick   Collection Time: 06/11/24  1:46 PM  Result Value Ref Range   Color,  UA yellow    Clarity, UA clear    Glucose, UA Negative Negative   Bilirubin, UA neg    Ketones, UA neg    Spec Grav, UA 1.025 1.010 - 1.025   Blood, UA neg    pH, UA 6.5 5.0 - 8.0   Protein, UA Negative Negative   Urobilinogen, UA 0.2 0.2 or 1.0 E.U./dL   Nitrite, UA neg    Leukocytes, UA Negative Negative   Appearance clear    Odor normal   POCT urine pregnancy   Collection Time: 06/11/24  1:46 PM  Result Value Ref Range   Preg Test, Ur Negative Negative          Assessment & Plan:   Problem List Items Addressed This Visit       Cardiovascular and Mediastinum   Essential hypertension   Other Visit Diagnoses       Abnormal vaginal bleeding    -  Primary   Relevant Orders   Cervicovaginal ancillary only   CBC with Differential/Platelet   Comprehensive metabolic panel with GFR   Iron, TIBC and Ferritin Panel   TSH   US  PELVIC COMPLETE WITH TRANSVAGINAL   POCT urinalysis dipstick (Completed)   POCT urine pregnancy (Completed)     Anemia, unspecified type            Assessment and Plan Assessment & Plan Abnormal uterine bleeding Reports breakthrough bleeding for the past one to two months, with a history of heavy, clotty periods three months ago. Started birth control in February, which may be contributing to the bleeding. Other causes such as infection, thyroid  dysfunction, or uterine polyps are considered. Occasional right-sided pelvic pain associated with spotting. Differential diagnosis includes hormonal imbalance due to birth control, thyroid  dysfunction, or structural abnormalities like polyps. She is considering a trial off birth control to assess changes in bleeding patterns. - Order CBC, CMP, iron panel, and thyroid  function tests to evaluate for anemia, metabolic abnormalities, and thyroid  dysfunction. - Perform a vaginal swab to check for infection. - Order a pelvic ultrasound to assess for uterine polyps or other structural abnormalities. - Advise her  to communicate any changes in bleeding patterns or decisions regarding birth control cessation. -urine pregnancy and dip negative  Iron deficiency anemia secondary to abnormal uterine bleeding (suspected) Anemia is suspected due to reported heavy menstrual bleeding. Iron deficiency is likely contributing to symptoms. - Order iron panel to assess the severity of iron deficiency. - Monitor hemoglobin and hematocrit levels through CBC.  Essential hypertension Previously on 40 mg of olmesartan , reduced to 20 mg due to elevated blood pressure, now well-controlled. She expresses a desire to eventually discontinue medication but acknowledges the need for stability first. - Continue olmesartan  20 mg daily as blood pressure is currently well-controlled. - Advise her to monitor blood pressure and report any significant  changes. - Discuss the possibility of tapering off medication once blood pressure remains stable.        Follow up plan: Return if symptoms worsen or fail to improve.

## 2024-06-12 ENCOUNTER — Encounter: Payer: Self-pay | Admitting: Nurse Practitioner

## 2024-06-12 ENCOUNTER — Other Ambulatory Visit: Payer: Self-pay | Admitting: Nurse Practitioner

## 2024-06-12 ENCOUNTER — Ambulatory Visit: Payer: Self-pay | Admitting: Nurse Practitioner

## 2024-06-12 ENCOUNTER — Other Ambulatory Visit: Payer: Self-pay

## 2024-06-12 DIAGNOSIS — R110 Nausea: Secondary | ICD-10-CM

## 2024-06-12 LAB — CBC WITH DIFFERENTIAL/PLATELET
Absolute Lymphocytes: 1485 {cells}/uL (ref 850–3900)
Absolute Monocytes: 350 {cells}/uL (ref 200–950)
Basophils Absolute: 20 {cells}/uL (ref 0–200)
Basophils Relative: 0.4 %
Eosinophils Absolute: 70 {cells}/uL (ref 15–500)
Eosinophils Relative: 1.4 %
HCT: 36 % (ref 35.0–45.0)
Hemoglobin: 11.7 g/dL (ref 11.7–15.5)
MCH: 25.2 pg — ABNORMAL LOW (ref 27.0–33.0)
MCHC: 32.5 g/dL (ref 32.0–36.0)
MCV: 77.6 fL — ABNORMAL LOW (ref 80.0–100.0)
MPV: 12.3 fL (ref 7.5–12.5)
Monocytes Relative: 7 %
Neutro Abs: 3075 {cells}/uL (ref 1500–7800)
Neutrophils Relative %: 61.5 %
Platelets: 217 Thousand/uL (ref 140–400)
RBC: 4.64 Million/uL (ref 3.80–5.10)
RDW: 14.5 % (ref 11.0–15.0)
Total Lymphocyte: 29.7 %
WBC: 5 Thousand/uL (ref 3.8–10.8)

## 2024-06-12 LAB — COMPREHENSIVE METABOLIC PANEL WITH GFR
AG Ratio: 1.5 (calc) (ref 1.0–2.5)
ALT: 12 U/L (ref 6–29)
AST: 13 U/L (ref 10–30)
Albumin: 4.1 g/dL (ref 3.6–5.1)
Alkaline phosphatase (APISO): 56 U/L (ref 31–125)
BUN: 8 mg/dL (ref 7–25)
CO2: 29 mmol/L (ref 20–32)
Calcium: 9.5 mg/dL (ref 8.6–10.2)
Chloride: 102 mmol/L (ref 98–110)
Creat: 0.55 mg/dL (ref 0.50–0.97)
Globulin: 2.8 g/dL (ref 1.9–3.7)
Glucose, Bld: 80 mg/dL (ref 65–99)
Potassium: 4.6 mmol/L (ref 3.5–5.3)
Sodium: 139 mmol/L (ref 135–146)
Total Bilirubin: 0.6 mg/dL (ref 0.2–1.2)
Total Protein: 6.9 g/dL (ref 6.1–8.1)
eGFR: 121 mL/min/1.73m2 (ref >=60)

## 2024-06-12 LAB — IRON,TIBC AND FERRITIN PANEL
%SAT: 9 % — ABNORMAL LOW (ref 16–45)
Ferritin: 9 ng/mL — ABNORMAL LOW (ref 16–154)
Iron: 47 ng/mL — AB (ref 40–190)
TIBC: 517 ug/dL — ABNORMAL HIGH (ref 250–450)

## 2024-06-12 LAB — TSH: TSH: 1.68 m[IU]/L

## 2024-06-12 MED ORDER — ONDANSETRON 4 MG PO TBDP
4.0000 mg | ORAL_TABLET | Freq: Three times a day (TID) | ORAL | 1 refills | Status: AC | PRN
  Filled 2024-06-12: qty 90, 30d supply, fill #0

## 2024-06-12 MED FILL — Pantoprazole Sodium EC Tab 40 MG (Base Equiv): ORAL | 30 days supply | Qty: 30 | Fill #1 | Status: CN

## 2024-06-12 NOTE — Telephone Encounter (Signed)
 Requested medications are due for refill today.  unsure  Requested medications are on the active medications list.  yes  Last refill. 03/08/2023 60mL 3 rf  Future visit scheduled.   no  Notes to clinic.  Medication not assigned to a protocol. Please review for refill.    Requested Prescriptions  Pending Prescriptions Disp Refills   aluminum  chloride (DRYSOL) 20 % external solution 60 mL 3    Sig: Apply 1 Application topically every 14 (fourteen) days.     Off-Protocol Failed - 06/12/2024  5:38 PM      Failed - Medication not assigned to a protocol, review manually.      Passed - Valid encounter within last 12 months    Recent Outpatient Visits           Yesterday Abnormal vaginal bleeding   [facility] Gareth Clarity F, FNP   7 months ago Nausea and vomiting, unspecified vomiting type   [facility] Gareth Clarity [place], [place]

## 2024-06-13 ENCOUNTER — Other Ambulatory Visit: Payer: Self-pay

## 2024-06-13 ENCOUNTER — Other Ambulatory Visit: Payer: Self-pay | Admitting: Nurse Practitioner

## 2024-06-15 ENCOUNTER — Ambulatory Visit
Admission: RE | Admit: 2024-06-15 | Discharge: 2024-06-15 | Disposition: A | Discharge status: Home / Self Care | Source: Ambulatory Visit | Attending: Nurse Practitioner | Admitting: Nurse Practitioner

## 2024-06-15 ENCOUNTER — Other Ambulatory Visit: Payer: Self-pay

## 2024-06-15 DIAGNOSIS — N939 Abnormal uterine and vaginal bleeding, unspecified: Secondary | ICD-10-CM | POA: Diagnosis present

## 2024-06-15 LAB — CERVICOVAGINAL ANCILLARY ONLY
Bacterial Vaginitis (gardnerella): NEGATIVE
Candida Glabrata: NEGATIVE
Candida Vaginitis: NEGATIVE
Chlamydia: NEGATIVE
Comment: NEGATIVE
Comment: NEGATIVE
Comment: NEGATIVE
Comment: NEGATIVE
Comment: NEGATIVE
Comment: NORMAL
Neisseria Gonorrhea: NEGATIVE
Trichomonas: NEGATIVE

## 2024-06-15 MED FILL — Aluminum Chloride Soln 20%: CUTANEOUS | 30 days supply | Qty: 60 | Fill #0 | Status: CN

## 2024-06-15 NOTE — Telephone Encounter (Signed)
 Requested medication (s) are due for refill today: yes  Requested medication (s) are on the active medication list: yees  Last refill:  03/08/23  Future visit scheduled: no  Notes to clinic:  Medication not assigned to a protocol, review manually.      Requested Prescriptions  Pending Prescriptions Disp Refills   DRYSOL 20 % external solution [Pharmacy Med Name: aluminum  chloride (DRYSOL) 20 % external solution] 60 mL 3    Sig: Apply 1 Application topically every 14 (fourteen) days.     Off-Protocol Failed - 06/15/2024  3:57 PM      Failed - Medication not assigned to a protocol, review manually.      Passed - Valid encounter within last 12 months    Recent Outpatient Visits           4 days ago Abnormal vaginal bleeding   [facility] Gareth Clarity F, FNP   7 months ago Nausea and vomiting, unspecified vomiting type   [facility] Gareth Clarity [place], [place]

## 2024-06-22 ENCOUNTER — Other Ambulatory Visit: Payer: Self-pay | Admitting: Nurse Practitioner

## 2024-06-22 ENCOUNTER — Other Ambulatory Visit: Payer: Self-pay

## 2024-06-22 DIAGNOSIS — N939 Abnormal uterine and vaginal bleeding, unspecified: Secondary | ICD-10-CM

## 2024-06-22 MED ORDER — NORGESTIMATE-ETH ESTRADIOL 0.25-35 MG-MCG PO TABS
1.0000 | ORAL_TABLET | Freq: Every day | ORAL | 11 refills | Status: AC
  Filled 2024-06-22: qty 28, 28d supply, fill #0
  Filled 2024-07-31: qty 28, 28d supply, fill #1
  Filled 2024-08-30: qty 28, 28d supply, fill #2

## 2024-06-29 ENCOUNTER — Other Ambulatory Visit: Payer: Self-pay

## 2024-07-21 ENCOUNTER — Other Ambulatory Visit: Payer: Self-pay

## 2024-07-21 MED ORDER — LISDEXAMFETAMINE DIMESYLATE 60 MG PO CAPS
60.0000 mg | ORAL_CAPSULE | Freq: Every morning | ORAL | 0 refills | Status: DC
  Filled 2024-07-21: qty 30, 30d supply, fill #0

## 2024-07-29 ENCOUNTER — Other Ambulatory Visit: Payer: Self-pay

## 2024-08-18 MED FILL — Pantoprazole Sodium EC Tab 40 MG (Base Equiv): ORAL | 30 days supply | Qty: 30 | Fill #1 | Status: AC

## 2024-08-24 ENCOUNTER — Telehealth: Admitting: Physician Assistant

## 2024-08-24 DIAGNOSIS — B9689 Other specified bacterial agents as the cause of diseases classified elsewhere: Secondary | ICD-10-CM

## 2024-08-24 DIAGNOSIS — J019 Acute sinusitis, unspecified: Secondary | ICD-10-CM

## 2024-08-24 MED ORDER — FLUTICASONE PROPIONATE 50 MCG/ACT NA SUSP
2.0000 | Freq: Every day | NASAL | 0 refills | Status: AC
  Filled 2024-08-24: qty 16, 30d supply, fill #0

## 2024-08-24 MED ORDER — AMOXICILLIN-POT CLAVULANATE 875-125 MG PO TABS
1.0000 | ORAL_TABLET | Freq: Two times a day (BID) | ORAL | 0 refills | Status: AC
  Filled 2024-08-24: qty 14, 7d supply, fill #0

## 2024-08-24 NOTE — Progress Notes (Signed)
 Virtual Visit Consent   Jessica Espinoza, you are scheduled for a virtual visit with a [facility] provider today. Just as with appointments in the office, your consent must be obtained to participate. Your consent will be active for this visit and any virtual visit you may have with one of our providers in the next 365 days. If you have a MyChart account, a copy of this consent can be sent to you electronically.  As this is a virtual visit, video technology does not allow for your provider to perform a traditional examination. This may limit your provider's ability to fully assess your condition. If your provider identifies any concerns that need to be evaluated in person or the need to arrange testing (such as labs, EKG, etc.), we will make arrangements to do so. Although advances in technology are sophisticated, we cannot ensure that it will always work on either your end or our end. If the connection with a video visit is poor, the visit may have to be switched to a telephone visit. With either a video or telephone visit, we are not always able to ensure that we have a secure connection.  By engaging in this virtual visit, you consent to the provision of healthcare and authorize for your insurance to be billed (if applicable) for the services provided during this visit. Depending on your insurance coverage, you may receive a charge related to this service.  I need to obtain your verbal consent now. Are you willing to proceed with your visit today? Jessica Espinoza has provided verbal consent on 08/24/2024 for a virtual visit (video or telephone). Jessica [name], PA-C  Date: 08/24/2024 6:36 PM   Virtual Visit via Video Note   I, Jessica Espinoza, connected with  Jessica Espinoza  ([birth year], May 04, 1986) on 08/24/24 at  6:30 PM EST by a video-enabled telemedicine application and verified that I am speaking with the correct person using two identifiers.  Location: Patient: Virtual Visit  Location Patient: Home Provider: Virtual Visit Location Provider: Home Office   I discussed the limitations of evaluation and management by telemedicine and the availability of in person appointments. The patient expressed understanding and agreed to proceed.    History of Present Illness: Jessica Espinoza is a 38 y.o. who identifies as a female who was assigned female at birth, and is being seen today for sinus pain and congestion.  HPI: Sinusitis This is a new problem. The current episode started 1 to 4 weeks ago (Had headcold symptoms 2 weeks ago, then improved. Yesterday developed like a rock in the left maxillary sinus. Today progressing and having pain radiate more toward the left ear as well). The problem has been gradually worsening since onset. There has been no fever. The pain is moderate. Associated symptoms include chills, congestion, headaches, sinus pressure (left maxillary) and a sore throat (irritation from post nasal drainage). Pertinent negatives include no coughing, diaphoresis, ear pain, hoarse voice, neck pain or shortness of breath. (Ear pressure on left) Treatments tried: ibuprofen , sudafed. The treatment provided no relief.     Problems:  Patient Active Problem List   Diagnosis Date Noted   Recurrent major depressive disorder, in partial remission 08/06/2023   Morbid obesity (HCC) 02/18/2023   Episodic mood disorder 02/11/2023   Attention and concentration deficit 02/11/2023   Moderate episode of recurrent major depressive disorder (HCC) 09/20/2022   Chronic right shoulder pain 09/20/2022   Adult ADHD 04/30/2022   Vitamin D deficiency 04/30/2022  Essential hypertension 08/09/2020   GAD (generalized anxiety disorder) 07/16/2017   Irritable bowel syndrome 07/16/2017   Obesity     Allergies:  Allergies  Allergen Reactions   Mustard Oil [Allyl Isothiocyanate] Swelling and Rash   Medications:  Current Outpatient Medications:    amoxicillin -clavulanate  (AUGMENTIN ) 875-125 MG tablet, Take 1 tablet by mouth 2 (two) times daily., Disp: 14 tablet, Rfl: 0   fluticasone  (FLONASE ) 50 MCG/ACT nasal spray, Place 2 sprays into both nostrils daily., Disp: 16 g, Rfl: 0   acetaminophen  (TYLENOL ) 500 MG tablet, Take 1,000 mg by mouth as needed for moderate pain., Disp: , Rfl:    albuterol  (VENTOLIN  HFA) 108 (90 Base) MCG/ACT inhaler, Inhale 1-2 puffs into the lungs every 6 (six) hours as needed. (Patient taking differently: Inhale 2 puffs into the lungs every 6 (six) hours as needed for wheezing or shortness of breath.), Disp: 6.7 g, Rfl: 0   aluminum  chloride (DRYSOL) 20 % external solution, Apply 1 Application topically every 14 (fourteen) days., Disp: 60 mL, Rfl: 3   amphetamine -dextroamphetamine  (ADDERALL) 20 MG tablet, Take 0.5-1 tablets (10-20 mg total) by mouth daily in the afternoon., Disp: 30 tablet, Rfl: 0   amphetamine -dextroamphetamine  (ADDERALL) 20 MG tablet, Take 0.5-1 tablets (10-20 mg total) by mouth daily in the afternoon., Disp: 30 tablet, Rfl: 0   amphetamine -dextroamphetamine  (ADDERALL) 20 MG tablet, Take 0.5-1 tablets (10-20 mg total) by mouth daily in the afternoon., Disp: 30 tablet, Rfl: 0   cetirizine (ZYRTEC) 10 MG tablet, Take 10 mg by mouth daily., Disp: , Rfl:    Cholecalciferol 50 MCG (2000 UT) TABS, Take 2,000 Units by mouth daily., Disp: , Rfl:    dicyclomine  (BENTYL ) 20 MG tablet, Take 1 tablet (20 mg total) by mouth every 6 (six) hours as needed. (Patient taking differently: Take 20 mg by mouth as needed for spasms.), Disp: 30 tablet, Rfl: 1   guanFACINE  (INTUNIV ) 2 MG TB24 ER tablet, Take 0.5 tablet (1 mg) - 1 tablet (2 mg total) by mouth at bedtime., Disp: 30 tablet, Rfl: 1   guanFACINE  (INTUNIV ) 2 MG TB24 ER tablet, Take 0.5-1 (1-2mg ) tablets by mouth at bedtime., Disp: 90 tablet, Rfl: 1   guanFACINE  (INTUNIV ) 2 MG TB24 ER tablet, Take 0.5-1 tablet (1 to 2 mg total) by mouth at bedtime., Disp: 90 tablet, Rfl: 1   guanFACINE   (INTUNIV ) 2 MG TB24 ER tablet, Take 1 tablet (2 mg total) by mouth at bedtime., Disp: 90 tablet, Rfl: 1   hydrOXYzine  (ATARAX ) 25 MG tablet, Take 0.5-1 tablets (12.5-25 mg total) by mouth 2 (two) times daily as needed for anxiety., Disp: 60 tablet, Rfl: 1   lisdexamfetamine (VYVANSE ) 60 MG capsule, Take 1 capsule (60 mg total) by mouth daily., Disp: 30 capsule, Rfl: 0   lisdexamfetamine (VYVANSE ) 60 MG capsule, Take 1 capsule (60 mg total) by mouth every morning., Disp: 30 capsule, Rfl: 0   lisdexamfetamine (VYVANSE ) 60 MG capsule, Take 1 capsule (60 mg total) by mouth in the morning., Disp: 30 capsule, Rfl: 0   lisdexamfetamine (VYVANSE ) 60 MG capsule, Take 1 capsule (60 mg total) by mouth every morning., Disp: 30 capsule, Rfl: 0   Multiple Vitamin (MULTI-VITAMIN) tablet, Take 1 tablet by mouth daily., Disp: , Rfl:    norgestimate -ethinyl estradiol  (ORTHO-CYCLEN) 0.25-35 MG-MCG tablet, Take 1 tablet by mouth daily. Use as directed., Disp: 28 tablet, Rfl: 11   olmesartan  (BENICAR ) 20 MG tablet, Take 1 tablet (20 mg total) by mouth daily., Disp: 30 tablet, Rfl:  2   ondansetron  (ZOFRAN -ODT) 4 MG disintegrating tablet, Take 1 tablet (4 mg total) by mouth every 8 (eight) hours as needed., Disp: 90 tablet, Rfl: 1   pantoprazole  (PROTONIX ) 40 MG tablet, Take 1 tablet (40 mg total) by mouth daily. Take this medication daily, regardless of reflux symptoms, Disp: 90 tablet, Rfl: 1   Probiotic Product (PROBIOTIC PO), Take 1 capsule by mouth daily., Disp: , Rfl:    sertraline  (ZOLOFT ) 100 MG tablet, Take 2 tablets (200 mg total) by mouth daily., Disp: 180 tablet, Rfl: 1  Observations/Objective: Patient is well-developed, well-nourished in no acute distress.  Resting comfortably at home.  Head is normocephalic, atraumatic.  No labored breathing.  Speech is clear and coherent with logical content.  Patient is alert and oriented at baseline.    Assessment and Plan: 1. Acute bacterial sinusitis (Primary) -  amoxicillin -clavulanate (AUGMENTIN ) 875-125 MG tablet; Take 1 tablet by mouth 2 (two) times daily.  Dispense: 14 tablet; Refill: 0 - fluticasone  (FLONASE ) 50 MCG/ACT nasal spray; Place 2 sprays into both nostrils daily.  Dispense: 16 g; Refill: 0  - Worsening symptoms that have not responded to OTC medications.  - Will give Augmentin  and Flonase  - Continue allergy medications.  - Steam and humidifier can help - Stay well hydrated and get plenty of rest.  - Seek in person evaluation if no symptom improvement or if symptoms worsen   Follow Up Instructions: I discussed the assessment and treatment plan with the patient. The patient was provided an opportunity to ask questions and all were answered. The patient agreed with the plan and demonstrated an understanding of the instructions.  A copy of instructions were sent to the patient via MyChart unless otherwise noted below.    The patient was advised to call back or seek an in-person evaluation if the symptoms worsen or if the condition fails to improve as anticipated.    Jessica [name], PA-C

## 2024-08-24 NOTE — Patient Instructions (Signed)
 Jessica Espinoza, thank you for joining Delon [name], PA-C for today's virtual visit.  While this provider is not your primary care provider (PCP), if your PCP is located in our provider database this encounter information will be shared with them immediately following your visit.   A [facility] MyChart account gives you access to today's visit and all your visits, tests, and labs performed at [facility]  click here if you don't have a [facility] MyChart account or go to [email or web address]  Consent: (Patient) Jessica Espinoza provided verbal consent for this virtual visit at the beginning of the encounter.  Current Medications:  Current Outpatient Medications:    amoxicillin -clavulanate (AUGMENTIN ) 875-125 MG tablet, Take 1 tablet by mouth 2 (two) times daily., Disp: 14 tablet, Rfl: 0   fluticasone  (FLONASE ) 50 MCG/ACT nasal spray, Place 2 sprays into both nostrils daily., Disp: 16 g, Rfl: 0   acetaminophen  (TYLENOL ) 500 MG tablet, Take 1,000 mg by mouth as needed for moderate pain., Disp: , Rfl:    albuterol  (VENTOLIN  HFA) 108 (90 Base) MCG/ACT inhaler, Inhale 1-2 puffs into the lungs every 6 (six) hours as needed. (Patient taking differently: Inhale 2 puffs into the lungs every 6 (six) hours as needed for wheezing or shortness of breath.), Disp: 6.7 g, Rfl: 0   aluminum  chloride (DRYSOL) 20 % external solution, Apply 1 Application topically every 14 (fourteen) days., Disp: 60 mL, Rfl: 3   amphetamine -dextroamphetamine  (ADDERALL) 20 MG tablet, Take 0.5-1 tablets (10-20 mg total) by mouth daily in the afternoon., Disp: 30 tablet, Rfl: 0   amphetamine -dextroamphetamine  (ADDERALL) 20 MG tablet, Take 0.5-1 tablets (10-20 mg total) by mouth daily in the afternoon., Disp: 30 tablet, Rfl: 0   amphetamine -dextroamphetamine  (ADDERALL) 20 MG tablet, Take 0.5-1 tablets (10-20 mg total) by mouth daily in the afternoon., Disp: 30 tablet, Rfl: 0   cetirizine (ZYRTEC) 10 MG  tablet, Take 10 mg by mouth daily., Disp: , Rfl:    Cholecalciferol 50 MCG (2000 UT) TABS, Take 2,000 Units by mouth daily., Disp: , Rfl:    dicyclomine  (BENTYL ) 20 MG tablet, Take 1 tablet (20 mg total) by mouth every 6 (six) hours as needed. (Patient taking differently: Take 20 mg by mouth as needed for spasms.), Disp: 30 tablet, Rfl: 1   guanFACINE  (INTUNIV ) 2 MG TB24 ER tablet, Take 0.5 tablet (1 mg) - 1 tablet (2 mg total) by mouth at bedtime., Disp: 30 tablet, Rfl: 1   guanFACINE  (INTUNIV ) 2 MG TB24 ER tablet, Take 0.5-1 (1-2mg ) tablets by mouth at bedtime., Disp: 90 tablet, Rfl: 1   guanFACINE  (INTUNIV ) 2 MG TB24 ER tablet, Take 0.5-1 tablet (1 to 2 mg total) by mouth at bedtime., Disp: 90 tablet, Rfl: 1   guanFACINE  (INTUNIV ) 2 MG TB24 ER tablet, Take 1 tablet (2 mg total) by mouth at bedtime., Disp: 90 tablet, Rfl: 1   hydrOXYzine  (ATARAX ) 25 MG tablet, Take 0.5-1 tablets (12.5-25 mg total) by mouth 2 (two) times daily as needed for anxiety., Disp: 60 tablet, Rfl: 1   lisdexamfetamine (VYVANSE ) 60 MG capsule, Take 1 capsule (60 mg total) by mouth daily., Disp: 30 capsule, Rfl: 0   lisdexamfetamine (VYVANSE ) 60 MG capsule, Take 1 capsule (60 mg total) by mouth every morning., Disp: 30 capsule, Rfl: 0   lisdexamfetamine (VYVANSE ) 60 MG capsule, Take 1 capsule (60 mg total) by mouth in the morning., Disp: 30 capsule, Rfl: 0   lisdexamfetamine (VYVANSE ) 60 MG capsule, Take 1 capsule (60 mg total)  by mouth every morning., Disp: 30 capsule, Rfl: 0   Multiple Vitamin (MULTI-VITAMIN) tablet, Take 1 tablet by mouth daily., Disp: , Rfl:    norgestimate -ethinyl estradiol  (ORTHO-CYCLEN) 0.25-35 MG-MCG tablet, Take 1 tablet by mouth daily. Use as directed., Disp: 28 tablet, Rfl: 11   olmesartan  (BENICAR ) 20 MG tablet, Take 1 tablet (20 mg total) by mouth daily., Disp: 30 tablet, Rfl: 2   ondansetron  (ZOFRAN -ODT) 4 MG disintegrating tablet, Take 1 tablet (4 mg total) by mouth every 8 (eight) hours as  needed., Disp: 90 tablet, Rfl: 1   pantoprazole  (PROTONIX ) 40 MG tablet, Take 1 tablet (40 mg total) by mouth daily. Take this medication daily, regardless of reflux symptoms, Disp: 90 tablet, Rfl: 1   Probiotic Product (PROBIOTIC PO), Take 1 capsule by mouth daily., Disp: , Rfl:    sertraline  (ZOLOFT ) 100 MG tablet, Take 2 tablets (200 mg total) by mouth daily., Disp: 180 tablet, Rfl: 1   Medications ordered in this encounter:  Meds ordered this encounter  Medications   amoxicillin -clavulanate (AUGMENTIN ) 875-125 MG tablet    Sig: Take 1 tablet by mouth 2 (two) times daily.    Dispense:  14 tablet    Refill:  0    Supervising Provider:   LAMPTEY, PHILIP O [[id number]]   fluticasone  (FLONASE ) 50 MCG/ACT nasal spray    Sig: Place 2 sprays into both nostrils daily.    Dispense:  16 g    Refill:  0    Supervising Provider:   BLAISE ALEENE KIDD [[id number]]     *If you need refills on other medications prior to your next appointment, please contact your pharmacy*  Follow-Up: Call back or seek an in-person evaluation if the symptoms worsen or if the condition fails to improve as anticipated.  [facility] [phone]  Other Instructions Sinus Infection, Adult A sinus infection, also called sinusitis, is inflammation of your sinuses. Sinuses are hollow spaces in the bones around your face. Your sinuses are located: Around your eyes. In the middle of your forehead. Behind your nose. In your cheekbones. Mucus normally drains out of your sinuses. When your nasal tissues become inflamed or swollen, mucus can become trapped or blocked. This allows bacteria, viruses, and fungi to grow, which leads to infection. Most infections of the sinuses are caused by a virus. A sinus infection can develop quickly. It can last for up to 4 weeks (acute) or for more than 12 weeks (chronic). A sinus infection often develops after a cold. What are the causes? This condition is caused by anything that  creates swelling in the sinuses or stops mucus from draining. This includes: Allergies. Asthma. Infection from bacteria or viruses. Deformities or blockages in your nose or sinuses. Abnormal growths in the nose (nasal polyps). Pollutants, such as chemicals or irritants in the air. Infection from fungi. This is rare. What increases the risk? You are more likely to develop this condition if you: Have a weak body defense system (immune system). Do a lot of swimming or diving. Overuse nasal sprays. Smoke. What are the signs or symptoms? The main symptoms of this condition are pain and a feeling of pressure around the affected sinuses. Other symptoms include: Stuffy nose or congestion that makes it difficult to breathe through your nose. Thick yellow or greenish drainage from your nose. Tenderness, swelling, and warmth over the affected sinuses. A cough that may get worse at night. Decreased sense of smell and taste. Extra mucus that collects in the  throat or the back of the nose (postnasal drip) causing a sore throat or bad breath. Tiredness (fatigue). Fever. How is this diagnosed? This condition is diagnosed based on: Your symptoms. Your medical history. A physical exam. Tests to find out if your condition is acute or chronic. This may include: Checking your nose for nasal polyps. Viewing your sinuses using a device that has a light (endoscope). Testing for allergies or bacteria. Imaging tests, such as an MRI or CT scan. In rare cases, a bone biopsy may be done to rule out more serious types of fungal sinus disease. How is this treated? Treatment for a sinus infection depends on the cause and whether your condition is chronic or acute. If caused by a virus, your symptoms should go away on their own within 10 days. You may be given medicines to relieve symptoms. They include: Medicines that shrink swollen nasal passages (decongestants). A spray that eases inflammation of the  nostrils (topical intranasal corticosteroids). Rinses that help get rid of thick mucus in your nose (nasal saline washes). Medicines that treat allergies (antihistamines). Over-the-counter pain relievers. If caused by bacteria, your health care provider may recommend waiting to see if your symptoms improve. Most bacterial infections will get better without antibiotic medicine. You may be given antibiotics if you have: A severe infection. A weak immune system. If caused by narrow nasal passages or nasal polyps, surgery may be needed. Follow these instructions at home: Medicines Take, use, or apply over-the-counter and prescription medicines only as told by your health care provider. These may include nasal sprays. If you were prescribed an antibiotic medicine, take it as told by your health care provider. Do not stop taking the antibiotic even if you start to feel better. Hydrate and humidify  Drink enough fluid to keep your urine pale yellow. Staying hydrated will help to thin your mucus. Use a cool mist humidifier to keep the humidity level in your home above 50%. Inhale steam for 10-15 minutes, 3-4 times a day, or as told by your health care provider. You can do this in the bathroom while a hot shower is running. Limit your exposure to cool or dry air. Rest Rest as much as possible. Sleep with your head raised (elevated). Make sure you get enough sleep each night. General instructions  Apply a warm, moist washcloth to your face 3-4 times a day or as told by your health care provider. This will help with discomfort. Use nasal saline washes as often as told by your health care provider. Wash your hands often with soap and water  to reduce your exposure to germs. If soap and water  are not available, use hand sanitizer. Do not smoke. Avoid being around people who are smoking (secondhand smoke). Keep all follow-up visits. This is important. Contact a health care provider if: You have a  fever. Your symptoms get worse. Your symptoms do not improve within 10 days. Get help right away if: You have a severe headache. You have persistent vomiting. You have severe pain or swelling around your face or eyes. You have vision problems. You develop confusion. Your neck is stiff. You have trouble breathing. These symptoms may be an emergency. Get help right away. Call 911. Do not wait to see if the symptoms will go away. Do not drive yourself to the hospital. Summary A sinus infection is soreness and inflammation of your sinuses. Sinuses are hollow spaces in the bones around your face. This condition is caused by nasal tissues that become  inflamed or swollen. The swelling traps or blocks the flow of mucus. This allows bacteria, viruses, and fungi to grow, which leads to infection. If you were prescribed an antibiotic medicine, take it as told by your health care provider. Do not stop taking the antibiotic even if you start to feel better. Keep all follow-up visits. This is important. This information is not intended to replace advice given to you by your health care provider. Make sure you discuss any questions you have with your health care provider. Document Revised: 08/22/2021 Document Reviewed: 08/22/2021 Elsevier Patient Education  2024 [employer]   If you have been instructed to have an in-person evaluation today at a local Urgent Care facility, please use the link below. It will take you to a list of all of our available [facility], including address, phone number and hours of operation. Please do not delay care.  [facility]  If you or a family member do not have a primary care provider, use the link below to schedule a visit and establish care. When you choose a [facility] primary care physician or advanced practice provider, you gain a long-term partner in health. Find a Primary Care Provider  Learn more about [facility]'s in-office and  virtual care options: [facility] - Get Care Now

## 2024-08-25 ENCOUNTER — Other Ambulatory Visit: Payer: Self-pay

## 2024-09-16 ENCOUNTER — Other Ambulatory Visit: Payer: Self-pay

## 2024-09-16 MED ORDER — LISDEXAMFETAMINE DIMESYLATE 60 MG PO CAPS
60.0000 mg | ORAL_CAPSULE | Freq: Every morning | ORAL | 0 refills | Status: AC
  Filled 2024-09-16: qty 30, 30d supply, fill #0

## 2024-09-16 MED FILL — Amphetamine-Dextroamphetamine Tab 20 MG: 10.0000 mg | ORAL | 30 days supply | Qty: 30 | Fill #0 | Status: AC

## 2024-09-17 ENCOUNTER — Encounter (HOSPITAL_COMMUNITY): Payer: Self-pay | Admitting: *Deleted

## 2024-09-21 ENCOUNTER — Encounter: Payer: Self-pay | Admitting: Nurse Practitioner

## 2024-10-08 ENCOUNTER — Other Ambulatory Visit: Payer: Self-pay

## 2024-10-28 ENCOUNTER — Emergency Department

## 2024-10-28 ENCOUNTER — Other Ambulatory Visit: Payer: Self-pay

## 2024-10-28 ENCOUNTER — Emergency Department: Admission: EM | Admit: 2024-10-28 | Discharge: 2024-10-28 | Disposition: A | Discharge status: Home / Self Care

## 2024-10-28 DIAGNOSIS — Y990 Civilian activity done for income or pay: Secondary | ICD-10-CM | POA: Insufficient documentation

## 2024-10-28 DIAGNOSIS — S0990XA Unspecified injury of head, initial encounter: Secondary | ICD-10-CM | POA: Diagnosis present

## 2024-10-28 DIAGNOSIS — W228XXA Striking against or struck by other objects, initial encounter: Secondary | ICD-10-CM | POA: Diagnosis not present

## 2024-10-28 DIAGNOSIS — S060X0A Concussion without loss of consciousness, initial encounter: Secondary | ICD-10-CM | POA: Diagnosis not present

## 2024-10-28 NOTE — Discharge Instructions (Signed)
 Please practice brain rest as we discussed.  Please follow-up with your outpatient provider.  Please return for any new, worsening, or change in symptoms or other concerns.  It was a pleasure caring for you today.

## 2024-10-28 NOTE — ED Notes (Signed)
 Pt ANOx4 and states she does not need a drug or alcohol screening for workers comp

## 2024-10-28 NOTE — ED Triage Notes (Signed)
 Pt to ED via [facility] from home. Pt reports 1/27 was here at work at hit [employer]. Pt reports continued migraine and forgetfulness. This will be worker comp

## 2024-10-28 NOTE — ED Provider Notes (Signed)
 "  [facility] Provider Note    Event Date/Time   First MD Initiated Contact with Patient 10/28/24 1140     (approximate)   History   Head Injury   HPI  Jessica Espinoza is a 39 y.o. female who presents today for evaluation of head injury.  Patient reports that she was at work yesterday morning at 5 AM and she hit her head on a shelf.  She did not lose consciousness.  She did not fall to the ground.  She feels slightly nauseated and has blurry vision, though no vomiting.  No neck pain.  No numbness or tingling or weakness in her extremities.  She is not anticoagulated.         Physical Exam   Triage Vital Signs: ED Triage Vitals  Encounter Vitals Group     BP 10/28/24 1113 (!) 141/89     Girls Systolic BP Percentile --      Girls Diastolic BP Percentile --      Boys Systolic BP Percentile --      Boys Diastolic BP Percentile --      Pulse Rate 10/28/24 1113 94     Resp 10/28/24 1113 17     Temp 10/28/24 1113 99.6 F (37.6 C)     Temp Source 10/28/24 1113 Oral     SpO2 10/28/24 1113 100 %     Weight --      Height --      Head Circumference --      Peak Flow --      Pain Score 10/28/24 1120 6     Pain Loc --      Pain Education --      Exclude from Growth Chart --     Most recent vital signs: Vitals:   10/28/24 1113 10/28/24 1226  BP: (!) 141/89   Pulse: 94   Resp: 17 18  Temp: 99.6 F (37.6 C)   SpO2: 100%     Physical Exam Vitals and nursing note reviewed.  Constitutional:      General: Awake and alert. No acute distress.    Appearance: Normal appearance. The patient is normal weight.  HENT:     Head: Normocephalic and atraumatic.     Mouth: Mucous membranes are moist.  Eyes:     General: PERRL. Normal EOMs        Right eye: No discharge.        Left eye: No discharge.     Conjunctiva/sclera: Conjunctivae normal.  Cardiovascular:     Rate and Rhythm: Normal rate and regular rhythm.     Pulses: Normal pulses.  Pulmonary:      Effort: Pulmonary effort is normal. No respiratory distress.     Breath sounds: Normal breath sounds.  Abdominal:     Abdomen is soft. There is no abdominal tenderness. No rebound or guarding. No distention. Musculoskeletal:        General: No swelling. Normal range of motion.     Cervical back: Normal range of motion and neck supple. No midline cervical spine tenderness.  Full range of motion of neck.  Negative Spurling test.  Negative Lhermitte sign.  Normal strength and sensation in bilateral upper extremities. Normal grip strength bilaterally.  Normal intrinsic muscle function of the hand bilaterally.  Normal radial pulses bilaterally. Skin:    General: Skin is warm and dry.     Capillary Refill: Capillary refill takes less than 2 seconds.  Findings: No rash.  Neurological:     Mental Status: The patient is awake and alert.   Neurological: GCS 15 alert and oriented x3 Normal speech, no expressive or receptive aphasia or dysarthria Cranial nerves II through XII intact Normal visual fields 5 out of 5 strength in all 4 extremities with intact sensation throughout No extremity drift Normal finger-to-nose testing, no limb or truncal ataxia    ED Results / Procedures / Treatments   Labs (all labs ordered are listed, but only abnormal results are displayed) Labs Reviewed - No data to display   EKG     RADIOLOGY I independently reviewed and interpreted imaging and agree with radiologists findings.     PROCEDURES:  Critical Care performed:   Procedures   MEDICATIONS ORDERED IN ED: Medications - No data to display   IMPRESSION / MDM / ASSESSMENT AND PLAN / ED COURSE  I reviewed the triage vital signs and the nursing notes.   Differential diagnosis includes, but is not limited to, contusion, concussion, less likely intracranial hemorrhage or cervical spine injury.  Patient presents to the emergency department after fall with head strike. No neurological  deficits or midline spinal tenderness. Not encephalopathic, overall well-appearing. Physical examination and imaging reassuring against urgent or emergent traumatic process. Does not require any other imaging or intervention at this time.  No cervical spine tenderness, normal intrinsic muscle function of hands bilaterally, normal strength and sensation in bilateral upper extremities, not consistent with central cord syndrome.  CAT scan is reassuring, we discussed brain rest for possible concussion.  She was given a work note.  Discussed care plan, return precautions, and advised close outpatient follow-up. Patient agrees with plan of care.   Patient's presentation is most consistent with acute complicated illness / injury requiring diagnostic workup.   FINAL CLINICAL IMPRESSION(S) / ED DIAGNOSES   Final diagnoses:  Minor head injury, initial encounter  Concussion without loss of consciousness, initial encounter     Rx / DC Orders   ED Discharge Orders     None        Note:  This document was prepared using Dragon voice recognition software and may include unintentional dictation errors.   Amritpal Shropshire E, PA-C 10/28/24 1334    Fernand Rossie HERO, MD 10/28/24 1541  "
